# Patient Record
Sex: Female | Born: 1955 | Race: White | Hispanic: No
Health system: Southern US, Community
[De-identification: ages and names within clinical notes are randomized; demographics above are authoritative.]

## PROBLEM LIST (undated history)

## (undated) DIAGNOSIS — R197 Diarrhea, unspecified: Secondary | ICD-10-CM

## (undated) DIAGNOSIS — F419 Anxiety disorder, unspecified: Secondary | ICD-10-CM

## (undated) DIAGNOSIS — D649 Anemia, unspecified: Secondary | ICD-10-CM

## (undated) DIAGNOSIS — F1910 Other psychoactive substance abuse, uncomplicated: Secondary | ICD-10-CM

## (undated) DIAGNOSIS — F1110 Opioid abuse, uncomplicated: Secondary | ICD-10-CM

## (undated) DIAGNOSIS — K6389 Other specified diseases of intestine: Secondary | ICD-10-CM

## (undated) DIAGNOSIS — F909 Attention-deficit hyperactivity disorder, unspecified type: Secondary | ICD-10-CM

## (undated) DIAGNOSIS — F32A Depression, unspecified: Secondary | ICD-10-CM

## (undated) DIAGNOSIS — K219 Gastro-esophageal reflux disease without esophagitis: Secondary | ICD-10-CM

## (undated) DIAGNOSIS — K859 Acute pancreatitis without necrosis or infection, unspecified: Secondary | ICD-10-CM

## (undated) HISTORY — DX: Attention-deficit hyperactivity disorder, unspecified type: F90.9

## (undated) HISTORY — DX: Other specified diseases of intestine: K63.89

## (undated) HISTORY — DX: Diarrhea, unspecified: R19.7

## (undated) HISTORY — PX: TONSILLECTOMY: SUR1361

## (undated) HISTORY — DX: Gastro-esophageal reflux disease without esophagitis: K21.9

## (undated) HISTORY — DX: Other psychoactive substance abuse, uncomplicated: F19.10

## (undated) HISTORY — DX: Depression, unspecified: F32.A

---

## 2000-06-10 ENCOUNTER — Emergency Department (HOSPITAL_COMMUNITY): Admission: EM | Admit: 2000-06-10 | Discharge: 2000-06-10 | Payer: Self-pay | Admitting: Emergency Medicine

## 2000-11-01 ENCOUNTER — Emergency Department (HOSPITAL_COMMUNITY): Admission: EM | Admit: 2000-11-01 | Discharge: 2000-11-01 | Payer: Self-pay | Admitting: Emergency Medicine

## 2000-11-01 ENCOUNTER — Encounter: Payer: Self-pay | Admitting: Emergency Medicine

## 2002-09-19 ENCOUNTER — Other Ambulatory Visit: Admission: RE | Admit: 2002-09-19 | Discharge: 2002-09-19 | Payer: Self-pay | Admitting: Obstetrics & Gynecology

## 2007-11-16 ENCOUNTER — Emergency Department (HOSPITAL_BASED_OUTPATIENT_CLINIC_OR_DEPARTMENT_OTHER): Admission: EM | Admit: 2007-11-16 | Discharge: 2007-11-16 | Payer: Self-pay | Admitting: Emergency Medicine

## 2007-11-30 ENCOUNTER — Emergency Department (HOSPITAL_COMMUNITY): Admission: EM | Admit: 2007-11-30 | Discharge: 2007-11-30 | Payer: Self-pay | Admitting: Emergency Medicine

## 2010-07-01 ENCOUNTER — Emergency Department (HOSPITAL_COMMUNITY): Payer: Managed Care, Other (non HMO)

## 2010-07-01 ENCOUNTER — Emergency Department (HOSPITAL_COMMUNITY)
Admission: EM | Admit: 2010-07-01 | Discharge: 2010-07-01 | Disposition: A | Discharge status: Home / Self Care | Payer: Managed Care, Other (non HMO) | Attending: Emergency Medicine | Admitting: Emergency Medicine

## 2010-07-01 DIAGNOSIS — R112 Nausea with vomiting, unspecified: Secondary | ICD-10-CM | POA: Insufficient documentation

## 2010-07-01 DIAGNOSIS — F988 Other specified behavioral and emotional disorders with onset usually occurring in childhood and adolescence: Secondary | ICD-10-CM | POA: Insufficient documentation

## 2010-07-01 DIAGNOSIS — E860 Dehydration: Secondary | ICD-10-CM | POA: Insufficient documentation

## 2010-07-01 DIAGNOSIS — R197 Diarrhea, unspecified: Secondary | ICD-10-CM | POA: Insufficient documentation

## 2010-07-01 DIAGNOSIS — R109 Unspecified abdominal pain: Secondary | ICD-10-CM | POA: Insufficient documentation

## 2010-07-01 DIAGNOSIS — R509 Fever, unspecified: Secondary | ICD-10-CM | POA: Insufficient documentation

## 2010-07-01 DIAGNOSIS — F341 Dysthymic disorder: Secondary | ICD-10-CM | POA: Insufficient documentation

## 2010-07-01 LAB — URINALYSIS, ROUTINE W REFLEX MICROSCOPIC
Bilirubin Urine: NEGATIVE
Glucose, UA: NEGATIVE mg/dL
Ketones, ur: NEGATIVE mg/dL
Nitrite: NEGATIVE
Protein, ur: 100 mg/dL — AB
Specific Gravity, Urine: 1.028 (ref 1.005–1.030)
Urobilinogen, UA: 0.2 mg/dL (ref 0.0–1.0)
pH: 5.5 (ref 5.0–8.0)

## 2010-07-01 LAB — COMPREHENSIVE METABOLIC PANEL
ALT: 18 U/L (ref 0–35)
AST: 26 U/L (ref 0–37)
CO2: 24 mEq/L (ref 19–32)
Calcium: 8.2 mg/dL — ABNORMAL LOW (ref 8.4–10.5)
Chloride: 111 mEq/L (ref 96–112)
GFR calc Af Amer: >60 mL/min (ref >60)
GFR calc non Af Amer: >60 mL/min (ref >60)
Potassium: 3.2 mEq/L — ABNORMAL LOW (ref 3.5–5.1)
Sodium: 144 mEq/L (ref 135–145)

## 2010-07-01 LAB — URINE MICROSCOPIC-ADD ON

## 2010-07-01 LAB — CBC
Hemoglobin: 15 g/dL (ref 12.0–15.0)
MCHC: 33.9 g/dL (ref 30.0–36.0)
RBC: 4.71 MIL/uL (ref 3.87–5.11)
WBC: 9.5 10*3/uL (ref 4.0–10.5)

## 2010-07-01 LAB — DIFFERENTIAL
Basophils Absolute: 0 10*3/uL (ref 0.0–0.1)
Basophils Relative: 0 % (ref 0–1)
Neutro Abs: 7.9 10*3/uL — ABNORMAL HIGH (ref 1.7–7.7)
Neutrophils Relative %: 84 % — ABNORMAL HIGH (ref 43–77)

## 2010-07-01 LAB — OCCULT BLOOD, POC DEVICE: Fecal Occult Bld: NEGATIVE

## 2011-01-22 LAB — DIFFERENTIAL
Basophils Relative: 0
Eosinophils Absolute: 0.2
Eosinophils Relative: 4
Monocytes Absolute: 0.5
Monocytes Relative: 8

## 2011-01-22 LAB — BASIC METABOLIC PANEL
CO2: 27
Chloride: 107
Creatinine, Ser: 0.63
GFR calc Af Amer: >60
Sodium: 143

## 2011-01-22 LAB — CBC
Hemoglobin: 15.6 — ABNORMAL HIGH
MCHC: 35
MCV: 97.5
RBC: 4.56

## 2012-09-01 ENCOUNTER — Emergency Department (HOSPITAL_BASED_OUTPATIENT_CLINIC_OR_DEPARTMENT_OTHER): Payer: 59

## 2012-09-01 ENCOUNTER — Emergency Department (HOSPITAL_BASED_OUTPATIENT_CLINIC_OR_DEPARTMENT_OTHER)
Admission: EM | Admit: 2012-09-01 | Discharge: 2012-09-01 | Disposition: A | Discharge status: Home / Self Care | Payer: 59 | Attending: Emergency Medicine | Admitting: Emergency Medicine

## 2012-09-01 ENCOUNTER — Encounter (HOSPITAL_BASED_OUTPATIENT_CLINIC_OR_DEPARTMENT_OTHER): Payer: Self-pay | Admitting: *Deleted

## 2012-09-01 DIAGNOSIS — R52 Pain, unspecified: Secondary | ICD-10-CM | POA: Insufficient documentation

## 2012-09-01 DIAGNOSIS — Z79899 Other long term (current) drug therapy: Secondary | ICD-10-CM | POA: Insufficient documentation

## 2012-09-01 DIAGNOSIS — M25561 Pain in right knee: Secondary | ICD-10-CM

## 2012-09-01 DIAGNOSIS — Z880 Allergy status to penicillin: Secondary | ICD-10-CM | POA: Insufficient documentation

## 2012-09-01 DIAGNOSIS — F17200 Nicotine dependence, unspecified, uncomplicated: Secondary | ICD-10-CM | POA: Insufficient documentation

## 2012-09-01 DIAGNOSIS — F411 Generalized anxiety disorder: Secondary | ICD-10-CM | POA: Insufficient documentation

## 2012-09-01 DIAGNOSIS — Z8739 Personal history of other diseases of the musculoskeletal system and connective tissue: Secondary | ICD-10-CM | POA: Insufficient documentation

## 2012-09-01 DIAGNOSIS — M25569 Pain in unspecified knee: Secondary | ICD-10-CM | POA: Insufficient documentation

## 2012-09-01 DIAGNOSIS — M19029 Primary osteoarthritis, unspecified elbow: Secondary | ICD-10-CM | POA: Insufficient documentation

## 2012-09-01 HISTORY — DX: Anxiety disorder, unspecified: F41.9

## 2012-09-01 MED ORDER — IBUPROFEN 600 MG PO TABS: 600.0000 mg | ORAL_TABLET | Freq: Three times a day (TID) | ORAL | Status: DC | PRN

## 2012-09-01 MED ORDER — HYDROCODONE-ACETAMINOPHEN 5-325 MG PO TABS: 1.0000 | ORAL_TABLET | ORAL | Status: DC | PRN

## 2012-09-01 NOTE — ED Notes (Signed)
Patient states she injured her foot and knee approximately 3 months ago while walking.  States she was seen for her foot and was placed in a cam walker.  States her knee has had intermittent swelling and pain.  States last night the pain is in the posterior knee was more increased.  Pt has not received any treatment for her knee.

## 2012-09-01 NOTE — ED Notes (Signed)
MD at bedside. 

## 2012-09-01 NOTE — ED Provider Notes (Signed)
History     CSN: [id number]  Arrival date & time 09/01/12  4098   First MD Initiated Contact with Patient 09/01/12 1114      Chief Complaint  Patient presents with  . Knee Pain    HPI Is reports intermittent right knee pain over the past several months.  She injured her left foot and walked around a Cam Walker for some time.  She reported discomfort in her right knee which to get into a low seated car.  She has not seen anyone for this.  She reports last night the swelling in her right knee was worse.  No history of DVT.  No weakness or numbness of her lower extremity.  Her swelling is improved today.  She states she has a history of arthritis and bursitis in her left elbow.  She's never been told she has arthritis in her right knee.  She denies fevers and chills.  No redness.  Her pain is worsened when she is more active such as recently when she was hiking in the mountains.  Her pain and swelling improves with rest and minimizing her activity    Past Medical History  Diagnosis Date  . Anxiety     Past Surgical History  Procedure Laterality Date  . Tonsillectomy      No family history on file.  History  Substance Use Topics  . Smoking status: Current Every Day Smoker    Types: Cigarettes  . Smokeless tobacco: Not on file  . Alcohol Use: 0.0 oz/week    1-2 Glasses of wine per week     Comment: daily    OB History   Grav Para Term Preterm Abortions TAB SAB Ect Mult Living                  Review of Systems  All other systems reviewed and are negative.    Allergies  Penicillins  Home Medications   Current Outpatient Rx  Name  Route  Sig  Dispense  Refill  . amphetamine-dextroamphetamine (ADDERALL) 10 MG tablet   Oral   Take 20 mg by mouth 2 (two) times daily.         . clonazePAM (KLONOPIN) 0.5 MG tablet   Oral   Take 1 mg by mouth 2 (two) times daily as needed for anxiety.         Marland Kitchen HYDROcodone-acetaminophen (NORCO/VICODIN) 5-325 MG per tablet  Oral   Take 1 tablet by mouth every 4 (four) hours as needed for pain.   15 tablet   0   . ibuprofen (ADVIL,MOTRIN) 600 MG tablet   Oral   Take 1 tablet (600 mg total) by mouth every 8 (eight) hours as needed for pain.   15 tablet   0     BP 142/89  Temp(Src) 98.7 F (37.1 C) (Oral)  Ht 5\' 3"  (1.6 m)  Wt 160 lb (72.576 kg)  BMI 28.35 kg/m2  SpO2 99%  Physical Exam  Nursing note and vitals reviewed. Constitutional: She is oriented to person, place, and time. She appears well-developed and well-nourished. No distress.  HENT:  Head: Normocephalic and atraumatic.  Eyes: EOM are normal.  Neck: Normal range of motion.  Cardiovascular: Normal rate, regular rhythm and normal heart sounds.   Pulmonary/Chest: Effort normal and breath sounds normal.  Abdominal: Soft. She exhibits no distension. There is no tenderness.  Musculoskeletal: Normal range of motion.  No significant right knee joint effusion.  No erythema warmth of her right  knee.  Full range of motion of her right knee.  She does have some limitations at maximal flexion of the right knee.  Normal pulses in right foot.  No unilateral leg swelling.  Mild tenderness over her right medial joint line  Neurological: She is alert and oriented to person, place, and time.  Skin: Skin is warm and dry.  Psychiatric: She has a normal mood and affect. Judgment normal.    ED Course  Procedures (including critical care time)  Labs Reviewed - No data to display Dg Knee Complete 4 Views Right  09/01/2012  *RADIOLOGY REPORT*  Clinical Data: Knee pain  RIGHT KNEE - COMPLETE 4+ VIEW  Comparison: None.  Findings: Four views of the right knee submitted.  No acute fracture or subluxation.  No radiopaque foreign body.  No joint effusion.  IMPRESSION: No acute fracture or subluxation.   Original Report Authenticated By: Natasha Mead, M.D.    I personally reviewed the imaging tests through PACS system I reviewed available ER/hospitalization records  through the EMR   1. Right knee pain       MDM  Referral to sports medicine.  Patient will likely need an MRI as an outpatient.  Consider ligamentous versus meniscal injury.  No signs of septic arthritis today.  Doubt DVT.        Lyanne Co, MD 09/01/12 1140

## 2012-09-01 NOTE — ED Notes (Signed)
Patient transported to X-ray 

## 2019-03-20 ENCOUNTER — Other Ambulatory Visit: Payer: Self-pay

## 2019-03-20 ENCOUNTER — Emergency Department (HOSPITAL_COMMUNITY): Payer: Medicare Other

## 2019-03-20 ENCOUNTER — Emergency Department (HOSPITAL_COMMUNITY)
Admission: EM | Admit: 2019-03-20 | Discharge: 2019-03-20 | Disposition: A | Discharge status: Home / Self Care | Payer: Medicare Other | Attending: Emergency Medicine | Admitting: Emergency Medicine

## 2019-03-20 DIAGNOSIS — F1910 Other psychoactive substance abuse, uncomplicated: Secondary | ICD-10-CM | POA: Insufficient documentation

## 2019-03-20 DIAGNOSIS — F17210 Nicotine dependence, cigarettes, uncomplicated: Secondary | ICD-10-CM | POA: Insufficient documentation

## 2019-03-20 DIAGNOSIS — R4182 Altered mental status, unspecified: Secondary | ICD-10-CM | POA: Diagnosis present

## 2019-03-20 DIAGNOSIS — Z79899 Other long term (current) drug therapy: Secondary | ICD-10-CM | POA: Diagnosis not present

## 2019-03-20 HISTORY — DX: Opioid abuse, uncomplicated: F11.10

## 2019-03-20 HISTORY — DX: Other psychoactive substance abuse, uncomplicated: F19.10

## 2019-03-20 LAB — URINALYSIS, COMPLETE (UACMP) WITH MICROSCOPIC
Bilirubin Urine: NEGATIVE
Glucose, UA: NEGATIVE mg/dL
Ketones, ur: NEGATIVE mg/dL
Nitrite: NEGATIVE
Protein, ur: 30 mg/dL — AB
Specific Gravity, Urine: 1.013 (ref 1.005–1.030)
pH: 5 (ref 5.0–8.0)

## 2019-03-20 LAB — I-STAT CHEM 8, ED
BUN: 15 mg/dL (ref 8–23)
Calcium, Ion: 1.19 mmol/L (ref 1.15–1.40)
Chloride: 102 mmol/L (ref 98–111)
Creatinine, Ser: 1.5 mg/dL — ABNORMAL HIGH (ref 0.44–1.00)
Glucose, Bld: 174 mg/dL — ABNORMAL HIGH (ref 70–99)
HCT: 46 % (ref 36.0–46.0)
Hemoglobin: 15.6 g/dL — ABNORMAL HIGH (ref 12.0–15.0)
Potassium: 3.9 mmol/L (ref 3.5–5.1)
Sodium: 140 mmol/L (ref 135–145)
TCO2: 24 mmol/L (ref 22–32)

## 2019-03-20 LAB — CBC WITH DIFFERENTIAL/PLATELET
Abs Immature Granulocytes: 0.05 10*3/uL (ref 0.00–0.07)
Basophils Absolute: 0.1 10*3/uL (ref 0.0–0.1)
Basophils Relative: 1 %
Eosinophils Absolute: 0.3 10*3/uL (ref 0.0–0.5)
Eosinophils Relative: 4 %
HCT: 48.1 % — ABNORMAL HIGH (ref 36.0–46.0)
Hemoglobin: 15.4 g/dL — ABNORMAL HIGH (ref 12.0–15.0)
Immature Granulocytes: 1 %
Lymphocytes Relative: 26 %
Lymphs Abs: 2 10*3/uL (ref 0.7–4.0)
MCH: 32.2 pg (ref 26.0–34.0)
MCHC: 32 g/dL (ref 30.0–36.0)
MCV: 100.6 fL — ABNORMAL HIGH (ref 80.0–100.0)
Monocytes Absolute: 0.4 10*3/uL (ref 0.1–1.0)
Monocytes Relative: 5 %
Neutro Abs: 4.9 10*3/uL (ref 1.7–7.7)
Neutrophils Relative %: 63 %
Platelets: 257 10*3/uL (ref 150–400)
RBC: 4.78 MIL/uL (ref 3.87–5.11)
RDW: 12.1 % (ref 11.5–15.5)
WBC: 7.7 10*3/uL (ref 4.0–10.5)
nRBC: 0 % (ref 0.0–0.2)

## 2019-03-20 LAB — RAPID URINE DRUG SCREEN, HOSP PERFORMED
Amphetamines: NOT DETECTED
Barbiturates: NOT DETECTED
Benzodiazepines: POSITIVE — AB
Cocaine: POSITIVE — AB
Opiates: POSITIVE — AB
Tetrahydrocannabinol: NOT DETECTED

## 2019-03-20 LAB — COMPREHENSIVE METABOLIC PANEL
ALT: 27 U/L (ref 0–44)
AST: 39 U/L (ref 15–41)
Albumin: 4.5 g/dL (ref 3.5–5.0)
Alkaline Phosphatase: 75 U/L (ref 38–126)
Anion gap: 12 (ref 5–15)
BUN: 15 mg/dL (ref 8–23)
CO2: 23 mmol/L (ref 22–32)
Calcium: 8.8 mg/dL — ABNORMAL LOW (ref 8.9–10.3)
Chloride: 104 mmol/L (ref 98–111)
Creatinine, Ser: 1.7 mg/dL — ABNORMAL HIGH (ref 0.44–1.00)
GFR calc Af Amer: 36 mL/min — ABNORMAL LOW (ref >60)
GFR calc non Af Amer: 31 mL/min — ABNORMAL LOW (ref >60)
Glucose, Bld: 177 mg/dL — ABNORMAL HIGH (ref 70–99)
Potassium: 3.8 mmol/L (ref 3.5–5.1)
Sodium: 139 mmol/L (ref 135–145)
Total Bilirubin: 0.8 mg/dL (ref 0.3–1.2)
Total Protein: 8.3 g/dL — ABNORMAL HIGH (ref 6.5–8.1)

## 2019-03-20 LAB — BLOOD GAS, ARTERIAL
Acid-base deficit: 3.6 mmol/L — ABNORMAL HIGH (ref 0.0–2.0)
Bicarbonate: 22 mmol/L (ref 20.0–28.0)
O2 Saturation: 97.6 %
Patient temperature: 98.6
pCO2 arterial: 44 mmHg (ref 32.0–48.0)
pH, Arterial: 7.319 — ABNORMAL LOW (ref 7.350–7.450)
pO2, Arterial: 109 mmHg — ABNORMAL HIGH (ref 83.0–108.0)

## 2019-03-20 LAB — LACTIC ACID, PLASMA: Lactic Acid, Venous: 2.7 mmol/L (ref 0.5–1.9)

## 2019-03-20 LAB — ETHANOL: Alcohol, Ethyl (B): <10 mg/dL (ref <10)

## 2019-03-20 LAB — SALICYLATE LEVEL: Salicylate Lvl: <7 mg/dL (ref 2.8–30.0)

## 2019-03-20 LAB — ACETAMINOPHEN LEVEL: Acetaminophen (Tylenol), Serum: <10 ug/mL — ABNORMAL LOW (ref 10–30)

## 2019-03-20 MED ORDER — SODIUM CHLORIDE 0.9 % IV BOLUS
1000.0000 mL | Freq: Once | INTRAVENOUS | Status: AC
  Administered 2019-03-20: 1000 mL via INTRAVENOUS

## 2019-03-20 MED ORDER — ONDANSETRON HCL 4 MG/2ML IJ SOLN
4.0000 mg | Freq: Once | INTRAMUSCULAR | Status: AC
  Administered 2019-03-20: 14:00:00 4 mg via INTRAVENOUS
  Filled 2019-03-20: qty 2

## 2019-03-20 MED ORDER — SODIUM CHLORIDE 0.9 % IV BOLUS
1500.0000 mL | Freq: Once | INTRAVENOUS | Status: AC
  Administered 2019-03-20: 19:00:00 1500 mL via INTRAVENOUS

## 2019-03-20 MED ORDER — NALOXONE HCL 2 MG/2ML IJ SOSY
1.0000 mg | PREFILLED_SYRINGE | Freq: Once | INTRAMUSCULAR | Status: AC
  Administered 2019-03-20: 14:00:00 1 mg via INTRAVENOUS
  Filled 2019-03-20: qty 2

## 2019-03-20 MED ORDER — SODIUM CHLORIDE 0.9 % IV BOLUS: 1000.0000 mL | Freq: Once | INTRAVENOUS | Status: DC

## 2019-03-20 NOTE — ED Triage Notes (Signed)
Per EMS patient was found at her daughters home in the car, body was locked up per EMS. EMS states patient is alert and will answer questions but tends to gaze off and has to be redirected. VS stable per EMS. EMS says per daughter, patient has history of crack cocaine use but cannot confirm use today.

## 2019-03-20 NOTE — ED Provider Notes (Signed)
[facility] DEPT Provider Note   CSN: [id number] Arrival date & time: 03/20/19  1333     History   Chief Complaint Chief Complaint  Patient presents with  . Altered Mental Status    HPI Debra Jackson is a 63 y.o. female who presents to the emergency department via EMS for altered mental status.  There is a level 5 caveat due to altered mentation.  EMS reports that the patient has a history of crack cocaine abuse.  Patient's daughter called out because the patient was sitting in her car and was lethargic.  I was asked to come urgently to the recess bay because patient was found to be hypoxic to 80%.  Patient is somnolent but arousable to verbal and touch stimulus.  She seems to be alert and oriented to person place and time.  Patient given IV Narcan 1 mg with immediate improvement in her mental status.  She admits to snorting heroin earlier today.     HPI  Past Medical History:  Diagnosis Date  . Anxiety     There are no active problems to display for this patient.   Past Surgical History:  Procedure Laterality Date  . TONSILLECTOMY       OB History   No obstetric history on file.      Home Medications    Prior to Admission medications   Medication Sig Start Date End Date Taking? Authorizing Provider  amphetamine-dextroamphetamine (ADDERALL) 10 MG tablet Take 20 mg by mouth 2 (two) times daily.    [provider]  clonazePAM (KLONOPIN) 0.5 MG tablet Take 1 mg by mouth 2 (two) times daily as needed for anxiety.    [provider]  HYDROcodone-acetaminophen (NORCO/VICODIN) 5-325 MG per tablet Take 1 tablet by mouth every 4 (four) hours as needed for pain. 09/01/12   Jola Schmidt, MD  ibuprofen (ADVIL,MOTRIN) 600 MG tablet Take 1 tablet (600 mg total) by mouth every 8 (eight) hours as needed for pain. 09/01/12   Jola Schmidt, MD    Family History No family history on file.  Social History Social History   Tobacco  Use  . Smoking status: Current Every Day Smoker    Types: Cigarettes  Substance Use Topics  . Alcohol use: Yes    Alcohol/week: 1.0 - 2.0 standard drinks    Types: 1 - 2 Glasses of wine per week    Comment: daily  . Drug use: No     Allergies   Penicillins   Review of Systems Review of Systems Ten systems reviewed and are negative for acute change, except as noted in the HPI.    Physical Exam Updated Vital Signs BP (!) 165/100   Pulse 88   Temp 99.4 F (37.4 C) (Oral)   Resp 16   Ht 5\' 6"  (1.676 m)   Wt 81.6 kg   SpO2 94%   BMI 29.05 kg/m   Physical Exam Vitals signs and nursing note reviewed.  Constitutional:      General: She is not in acute distress.    Appearance: She is well-developed. She is not diaphoretic.  HENT:     Head: Normocephalic and atraumatic.  Eyes:     General: No scleral icterus.    Conjunctiva/sclera: Conjunctivae normal.     Comments: Miotic pupils  Neck:     Musculoskeletal: Normal range of motion.  Cardiovascular:     Rate and Rhythm: Normal rate and regular rhythm.     Heart sounds:  Normal heart sounds. No murmur. No friction rub. No gallop.   Pulmonary:     Effort: Pulmonary effort is normal. No respiratory distress.     Breath sounds: Normal breath sounds.  Abdominal:     General: Bowel sounds are normal. There is no distension.     Palpations: Abdomen is soft. There is no mass.     Tenderness: There is no abdominal tenderness. There is no guarding.  Skin:    General: Skin is warm and dry.  Neurological:     Mental Status: She is alert.     Comments: Patient somnolent, trailing off in between sentences consistent with opiate narcosis  Psychiatric:        Behavior: Behavior normal.      ED Treatments / Results  Labs (all labs ordered are listed, but only abnormal results are displayed) Labs Reviewed  COMPREHENSIVE METABOLIC PANEL - Abnormal; Notable for the following components:      Result Value   Glucose, Bld 177  (*)    Creatinine, Ser 1.70 (*)    Calcium 8.8 (*)    Total Protein 8.3 (*)    GFR calc non Af Amer 31 (*)    GFR calc Af Amer 36 (*)    All other components within normal limits  CBC WITH DIFFERENTIAL/PLATELET - Abnormal; Notable for the following components:   Hemoglobin 15.4 (*)    HCT 48.1 (*)    MCV 100.6 (*)    All other components within normal limits  BLOOD GAS, ARTERIAL - Abnormal; Notable for the following components:   pH, Arterial 7.319 (*)    pO2, Arterial 109 (*)    Acid-base deficit 3.6 (*)    All other components within normal limits  I-STAT CHEM 8, ED - Abnormal; Notable for the following components:   Creatinine, Ser 1.50 (*)    Glucose, Bld 174 (*)    Hemoglobin 15.6 (*)    All other components within normal limits  CULTURE, BLOOD (ROUTINE X 2)  CULTURE, BLOOD (ROUTINE X 2)  URINALYSIS, COMPLETE (UACMP) WITH MICROSCOPIC  AMMONIA  LACTIC ACID, PLASMA  RAPID URINE DRUG SCREEN, HOSP PERFORMED  ETHANOL  URINALYSIS, ROUTINE W REFLEX MICROSCOPIC  ACETAMINOPHEN LEVEL  SALICYLATE LEVEL  CBG MONITORING, ED    EKG EKG Interpretation  Date/Time:  Tuesday March 20 2019 14:07:01 EST Ventricular Rate:  98 PR Interval:    QRS Duration: 94 QT Interval:  367 QTC Calculation: 469 R Axis:   -31 Text Interpretation: Sinus rhythm Probable left atrial enlargement Left ventricular hypertrophy Anterior Q waves, possibly due to LVH Baseline wander in lead(s) II III aVF Confirmed by Veryl Speak [phone]) on 03/20/2019 2:16:48 PM   Radiology No results found.  Procedures Procedures (including critical care time)  Medications Ordered in ED Medications  naloxone [facility]) injection 1 mg (1 mg Intravenous Given 03/20/19 1402)  ondansetron (ZOFRAN) injection 4 mg (4 mg Intravenous Given 03/20/19 1403)  sodium chloride 0.9 % bolus 1,000 mL (1,000 mLs Intravenous New Bag/Given 03/20/19 1403)     Initial Impression / Assessment and Plan / ED Course  I have reviewed  the triage vital signs and the nursing notes.  Pertinent labs & imaging results that were available during my care of the patient were reviewed by me and considered in my medical decision making (see chart for details).  Clinical Course as of Mar 19 2113  Tue Mar 20, 2019  1755 Lactic Acid, Venous(!!): 2.7 [AH]  1755 pH, Arterial(!): 7.319 [  AH]  1755 WBC: 7.7 [AH]    Clinical Course User Index [AH] Margarita Mail, PA-C       CC:ams/ hypoxia VS:  Vitals:   03/20/19 1900 03/20/19 2040 03/20/19 2041 03/20/19 2048  BP: (!) 121/93 (!) 138/93 (!) 138/93 (!) 148/81  Pulse: 86 65 82 80  Resp: 15  17 18   Temp:   98.7 F (37.1 C)   TempSrc:   Oral   SpO2: (!) 89% 97% 94% 98%  Weight:      Height:       [id number] is gathered by patient and emr. DDX:The differential diagnosis for AMS is extensive and includes, but is not limited to: drug overdose - opioids, alcohol, sedatives, antipsychotics, drug withdrawal, others; Metabolic: hypoxia, hypoglycemia, hyperglycemia, hypercalcemia, hypernatremia, hyponatremia, uremia, hepatic encephalopathy, hypothyroidism, hyperthyroidism, vitamin B12 or thiamine deficiency, carbon monoxide poisoning, Wilson's disease, Lactic acidosis, DKA/HHOS; Infectious: meningitis, encephalitis, bacteremia/sepsis, urinary tract infection, pneumonia, neurosyphilis; Structural: Space-occupying lesion, (brain tumor, subdural hematoma, hydrocephalus,); Vascular: stroke, subarachnoid hemorrhage, coronary ischemia, hypertensive encephalopathy, CNS vasculitis, thrombotic thrombocytopenic purpura, disseminated intravascular coagulation, hyperviscosity; Psychiatric: Schizophrenia, depression; Other: Seizure, hypothermia, heat stroke, ICU psychosis, dementia -"sundowning." Labs: I reviewed the labs which show urine drug screen positive for opiates, cocaine's and benzos.  Urinalysis appears contaminated.  Patient's creatinine appears to be at baseline.  She has elevated blood glucose  of insignificant value.  CBC shows no elevated white blood cell count.  Lactic acid elevated with ABG showing acidosis likely secondary to some dehydration or cocaine abuse. Imaging: I personally reviewed the images (portable 1 view chest x-ray and head CT) which show(s) no acute abnormalities on my interpretation EKG: Normal sinus rhythm at a rate of 98 MDM: Patient here with hypoxia and altered mental status secondary to unintentional drug overdose.  Her urine is positive for cocaine, benzos.  She admits to snorting heroin prior to arrival.  Patient became immediately more alert after dose of Narcan.  She was observed in the emergency department for several hours with improvement in her mentation, she has no more opiate or benzo narcosis and is maintaining her airway throughout without oxygen support and wishes to be discharged at this time. Patient disposition: Discharge Patient condition: Good. The patient appears reasonably screened and/or stabilized for discharge and I doubt any other medical condition or other [facility] requiring further screening, evaluation, or treatment in the ED at this time prior to discharge. I have discussed lab and/or imaging findings with the patient and answered all questions/concerns to the best of my ability. I have discussed return precautions and OP follow up.      Final Clinical Impressions(s) / ED Diagnoses   Final diagnoses:  None    ED Discharge Orders    None       Margarita Mail, PA-C 03/20/19 2119    Veryl Speak, MD 03/21/19 [phone]

## 2019-03-20 NOTE — ED Notes (Signed)
Pt drinking gingerale. States would like to try to use restroom when finished

## 2019-03-20 NOTE — ED Provider Notes (Signed)
Patient wants to go home.  Her lab results are back and she has positive opiates, cocaine, benzos. I turned her oxygen off and her O2 sats were 9394%.  She denies any shortness of breath.  She has mild AKI but was given IV fluids.  She is aware that her creatinine has gone up and that she needs to hydrate well.  Peer support consult has been placed.  She has no SI but thinks that she might need to see a psychiatrist.   Strict ER return precautions have been discussed, and patient is agreeing with the plan and is comfortable with the workup done and the recommendations from the ER.     Varney Biles, MD 03/20/19 2021

## 2019-03-20 NOTE — Discharge Instructions (Addendum)
Substance Abuse Treatment Programs ° °Intensive Outpatient Programs °[facility]     °[address]      °[place], [place]                   °[phone]      ° °[facility] °[address] °[place], [place] °[phone] ° °[facility]     °(Inpatient and outpatient)     °[address]           °[phone]   ° °[facility] °[phone] (Suboxone and Methadone) ° °[address]      °[place], [place] [address]      °[phone]      ° °[address] °[place], [place] °852-3033 ° °[facility] (Outpatient/Inpatient, Chemical)    °(insurance only) [phone]      °       °[facility] (Groups & Residential) °[place], [place] °[phone] ° °   °[facility]     °[address]     °[place], [place]      °[phone]      ° °[facility] (for caregivers and family) °[address] °[place], [place] °336-299-4655 ° ° ° ° ° °Residential Treatment Programs °[facility]      °[address], [place], [place] [address]  °[phone]      ° °[facility] °[address] [place], [place] [address] °[phone] ° °[facility]        °[phone]      ° °[facility] °[phone] ° °[facility]             °[address]                                         °[place], [place]                                                °877-615-2722 or [phone]                              ° °[facility] [place] °[address] °[place], [address] °[phone] ° °[facility]    °[address]      °[place], [place]     °[phone]      ° °[facility] °[address] °[place], [place] °[phone] ° °[facility]   °[address]     °[place], [place] [address]     °[phone]      °Admissions: 8am-3pm M-F ° °[facility] °[address] °[place],  [place] °[phone] ° °[facility]: Residential Program (90 Days)   °[place], [place]      °336-725-8389 or [phone]    ° °[facility] °[place],  °(Walk in Hours over the weekend or by referral) ° °[facility] °[address], [place],  [address] °([phone] ° °Crisis Mobile: [facility]:  1-877-626-1772 (for crisis response 24 hours a day) °[facility] Hotline:      1-800-256-2452 °Outpatient Psychiatry and Counseling ° °[facility]: Mobile Crisis   Management 24 hours:  1-877-626-1772 ° °[facility] sliding scale fee and walk in schedule: M-F 8am-12pm/1pm-3pm °[address]  °[place], [place] [address] °[phone] ° °[facility] °[address] °[place], [place] [address] °[phone] ° °[facility] ([facility])- new patient walk-in appointments available Monday - Friday 8am -3pm.          °[address] °[place], [place] [address] °336-676-6840 or crisis line- 336-676-6905 ° °[facility] Intensive Outpatient Therapy Program °[address] °[place], [place] [address] °336-832-9804 ° °[facility]                  °Crisis Services      °[phone]      °[address]     °[place], [place] [address]                ° °[facility]   °[facility] °[phone] °[address] °[place], [place] [address] ° ° °[facility]          °[address]  °[place], [place] [address]       °[phone] ° °[facility] °[address] °[place], [place] [address] °336-292-1510 ° °[facility]    °[address]    °[place], [place] [address]     °[phone]      ° °Parish McKinney, MD     °[address]     °[place] [address]     °336-282-1251     °  °[facility] °[address] °[place] [address] ° °[facility]     °[address]     °[place], [place]      °336-542-2076      ° °[facility] °Shamsher Ahluwalia, MD °[address] °[place], [place] [address] °[phone] ° °[facility]     °[address]     °[place], [place] [address]     °336-274-1237      ° °[facility] °[address] °[place], [place] [address] °336-854-4450 °Resources for Temporary Residential Assistance/Crisis Centers ° °DAY CENTERS °[facility] °M-F 8am-3pm   °[address] [place], [place] [address]   336-332-0824 °Services include: laundry, barbering, support groups, case management, phone  & computer access, showers, AA/NA mtgs, mental health/substance abuse nurse, job skills class, disability information, VA assistance, spiritual classes, etc.  ° °HOMELESS SHELTERS ° °[facility]     °[facility]   °[address], [place]     °[phone]       °       °[facility] (women and children)       °[address] °[place], [place] [address] °336-275-0820 °[email or web address] for application and process °Application Required ° °[facility]   °[address]    °[place] [address]     °[phone]       °             °[facility] °[address] °, [place] [address] °336.273.5572 °336-235-0363(schedule application appt.) °Application Required ° °[facility] (women only)    °[address]     °[place],  [address]     °336-884-1039      °  Intake starts 6pm daily °Need valid ID, SSC, & Police report °[facility] [place] °[address] °[place], [place] °336-881-5420 °Application Required ° °[facility] (men only)     °[address]      °[place], [place]     °[phone]      ° °[facility] °(Pregnant women only) °[address] °[place], [place] °336-275-0206 ° °[facility]      °[address]      °[place], [place] [address]     °[phone]      °       °[facility] °[address] °[place], [place] °336-723-1848 °90 day commitment/SA/Application process ° °[facility](men only)     °[address]     °[place], [place]     °[phone]       °Check-in at 7pm     °       °[facility] °[address] °[place], [place] [address] °336-248-6684 °Men/Women/Women and Children must be there by 7 pm ° °[facility] °[place], [place] °336-722-8721                ° °

## 2019-03-20 NOTE — ED Notes (Addendum)
Date and time results received: 03/20/19 3:52 PM  Test: Lactic Acid Critical Value: 2.7  Vernie Shanks, PA and [place], MD made aware.

## 2019-03-21 ENCOUNTER — Encounter (HOSPITAL_COMMUNITY): Payer: Self-pay | Admitting: Emergency Medicine

## 2019-03-21 ENCOUNTER — Emergency Department (HOSPITAL_COMMUNITY)
Admission: EM | Admit: 2019-03-21 | Discharge: 2019-03-21 | Disposition: A | Discharge status: Home / Self Care | Payer: Medicare Other | Attending: Emergency Medicine | Admitting: Emergency Medicine

## 2019-03-21 ENCOUNTER — Encounter (HOSPITAL_COMMUNITY): Payer: Self-pay

## 2019-03-21 ENCOUNTER — Telehealth (HOSPITAL_BASED_OUTPATIENT_CLINIC_OR_DEPARTMENT_OTHER): Payer: Self-pay | Admitting: Emergency Medicine

## 2019-03-21 ENCOUNTER — Telehealth (HOSPITAL_BASED_OUTPATIENT_CLINIC_OR_DEPARTMENT_OTHER): Payer: Self-pay | Admitting: *Deleted

## 2019-03-21 DIAGNOSIS — R7989 Other specified abnormal findings of blood chemistry: Secondary | ICD-10-CM | POA: Insufficient documentation

## 2019-03-21 DIAGNOSIS — Z79899 Other long term (current) drug therapy: Secondary | ICD-10-CM | POA: Insufficient documentation

## 2019-03-21 DIAGNOSIS — R799 Abnormal finding of blood chemistry, unspecified: Secondary | ICD-10-CM | POA: Diagnosis present

## 2019-03-21 DIAGNOSIS — F17210 Nicotine dependence, cigarettes, uncomplicated: Secondary | ICD-10-CM | POA: Diagnosis not present

## 2019-03-21 LAB — BASIC METABOLIC PANEL WITH GFR
Anion gap: 10 (ref 5–15)
BUN: 11 mg/dL (ref 8–23)
CO2: 25 mmol/L (ref 22–32)
Calcium: 9.1 mg/dL (ref 8.9–10.3)
Chloride: 101 mmol/L (ref 98–111)
Creatinine, Ser: 0.94 mg/dL (ref 0.44–1.00)
GFR calc Af Amer: >60 mL/min
GFR calc non Af Amer: >60 mL/min
Glucose, Bld: 94 mg/dL (ref 70–99)
Potassium: 3.6 mmol/L (ref 3.5–5.1)
Sodium: 136 mmol/L (ref 135–145)

## 2019-03-21 LAB — CBC WITH DIFFERENTIAL/PLATELET
Abs Immature Granulocytes: 0.03 10*3/uL (ref 0.00–0.07)
Basophils Absolute: 0 10*3/uL (ref 0.0–0.1)
Basophils Relative: 0 %
Eosinophils Absolute: 0.3 10*3/uL (ref 0.0–0.5)
Eosinophils Relative: 4 %
HCT: 43.9 % (ref 36.0–46.0)
Hemoglobin: 14.2 g/dL (ref 12.0–15.0)
Immature Granulocytes: 0 %
Lymphocytes Relative: 25 %
Lymphs Abs: 1.9 10*3/uL (ref 0.7–4.0)
MCH: 32 pg (ref 26.0–34.0)
MCHC: 32.3 g/dL (ref 30.0–36.0)
MCV: 98.9 fL (ref 80.0–100.0)
Monocytes Absolute: 0.5 10*3/uL (ref 0.1–1.0)
Monocytes Relative: 7 %
Neutro Abs: 4.8 10*3/uL (ref 1.7–7.7)
Neutrophils Relative %: 64 %
Platelets: 201 10*3/uL (ref 150–400)
RBC: 4.44 MIL/uL (ref 3.87–5.11)
RDW: 12 % (ref 11.5–15.5)
WBC: 7.5 10*3/uL (ref 4.0–10.5)
nRBC: 0 % (ref 0.0–0.2)

## 2019-03-21 NOTE — ED Triage Notes (Signed)
Pt returns today after receiving call for positive blood cultures. Pt has wound on right forearm as well.

## 2019-03-21 NOTE — ED Provider Notes (Signed)
[facility] DEPT Provider Note   CSN: [id number] Arrival date & time: 03/21/19  1208     History   Chief Complaint Chief Complaint  Patient presents with  . Abnormal Lab    HPI Debra Jackson is a 63 y.o. female.  Presents to ER with concern for abnormal blood culture result.  Patient was seen in our emergency department yesterday after concern for accidental overdose.  No UDS demonstrated opiates, cocaine, benzos.  Patient was observed in ER, she was able to be discharged home.  Blood cultures drawn yesterday, today grew gram-positive rods and gram-positive cocci from 1 bottle only.  Patient was recommended to come to ER for further evaluation.  Patient states today she has absolutely no symptoms, feels normal.  She has no rashes, no abscess, no fevers.     HPI  Past Medical History:  Diagnosis Date  . Anxiety   . Opiate abuse, continuous ([place])   . Polysubstance abuse ([place])     There are no active problems to display for this patient.   Past Surgical History:  Procedure Laterality Date  . TONSILLECTOMY       OB History   No obstetric history on file.      Home Medications    Prior to Admission medications   Medication Sig Start Date End Date Taking? Authorizing Provider  amphetamine-dextroamphetamine (ADDERALL) 10 MG tablet Take 20 mg by mouth 2 (two) times daily.    [provider]  clonazePAM (KLONOPIN) 0.5 MG tablet Take 1 mg by mouth 2 (two) times daily as needed for anxiety.    [provider]  HYDROcodone-acetaminophen (NORCO/VICODIN) 5-325 MG per tablet Take 1 tablet by mouth every 4 (four) hours as needed for pain. 09/01/12   Jola Schmidt, MD  ibuprofen (ADVIL,MOTRIN) 600 MG tablet Take 1 tablet (600 mg total) by mouth every 8 (eight) hours as needed for pain. 09/01/12   Jola Schmidt, MD    Family History History reviewed. No pertinent family history.  Social History Social History   Tobacco Use  .  Smoking status: Current Every Day Smoker    Types: Cigarettes  Substance Use Topics  . Alcohol use: Yes    Alcohol/week: 1.0 - 2.0 standard drinks    Types: 1 - 2 Glasses of wine per week    Comment: daily  . Drug use: Yes    Types: Cocaine    Comment: heroin     Allergies   Penicillins   Review of Systems Review of Systems  Constitutional: Negative for chills and fever.  HENT: Negative for ear pain and sore throat.   Eyes: Negative for pain and visual disturbance.  Respiratory: Negative for cough and shortness of breath.   Cardiovascular: Negative for chest pain and palpitations.  Gastrointestinal: Negative for abdominal pain and vomiting.  Genitourinary: Negative for dysuria and hematuria.  Musculoskeletal: Negative for arthralgias and back pain.  Skin: Negative for color change and rash.  Neurological: Negative for seizures and syncope.  All other systems reviewed and are negative.    Physical Exam Updated Vital Signs BP (!) 153/76 (BP Location: Right Arm)   Pulse 86   Temp 98 F (36.7 C) (Oral)   Resp 17   SpO2 97%   Physical Exam Vitals signs and nursing note reviewed.  Constitutional:      General: She is not in acute distress.    Appearance: She is well-developed.  HENT:     Head: Normocephalic and atraumatic.  Eyes:     Conjunctiva/sclera: Conjunctivae normal.  Neck:     Musculoskeletal: Neck supple.  Cardiovascular:     Rate and Rhythm: Normal rate and regular rhythm.     Heart sounds: No murmur.  Pulmonary:     Effort: Pulmonary effort is normal. No respiratory distress.     Breath sounds: Normal breath sounds.  Abdominal:     Palpations: Abdomen is soft.     Tenderness: There is no abdominal tenderness.  Musculoskeletal:        General: No swelling or tenderness.  Skin:    General: Skin is warm and dry.     Capillary Refill: Capillary refill takes less than 2 seconds.  Neurological:     General: No focal deficit present.     Mental  Status: She is alert and oriented to person, place, and time.      ED Treatments / Results  Labs (all labs ordered are listed, but only abnormal results are displayed) Labs Reviewed  CBC WITH DIFFERENTIAL/PLATELET  BASIC METABOLIC PANEL    EKG None  Radiology Ct Head Wo Contrast  Result Date: 03/20/2019 CLINICAL DATA:  Altered mental status. EXAM: CT HEAD WITHOUT CONTRAST TECHNIQUE: Contiguous axial images were obtained from the base of the skull through the vertex without intravenous contrast. COMPARISON:  None. FINDINGS: Brain: No evidence of acute infarction, hemorrhage, hydrocephalus, extra-axial collection or mass lesion/mass effect. Mild generalized cerebral atrophy. Vascular: No hyperdense vessel or unexpected calcification. Skull: Normal. Negative for fracture or focal lesion. Sinuses/Orbits: No acute finding. Other: None. IMPRESSION: 1.  No acute intracranial abnormality. Electronically Signed   By: Titus Dubin M.D.   On: 03/20/2019 15:13   Dg Chest Port 1 View  Result Date: 03/20/2019 CLINICAL DATA:  Hypoxia EXAM: PORTABLE CHEST 1 VIEW COMPARISON:  November 16, 2007 FINDINGS: Lungs are clear. Heart size and pulmonary vascularity are normal. No adenopathy. No bone lesions. IMPRESSION: No edema or consolidation. Electronically Signed   By: Lowella Grip III M.D.   On: 03/20/2019 19:12    Procedures Procedures (including critical care time)  Medications Ordered in ED Medications - No data to display   Initial Impression / Assessment and Plan / ED Course  I have reviewed the triage vital signs and the nursing notes.  Pertinent labs & imaging results that were available during my care of the patient were reviewed by me and considered in my medical decision making (see chart for details).  Clinical Course as of Mar 20 1413  Wed Mar 21, 2019  1300 Complete initial assessment, patient very well-appearing in no distress, no obvious signs of infection   [RD]  1336  Discussed with Dustin with ID pharmacy, reviewed culture results in detail, most likely contaminant, he will follow up on culture results   [RD]    Clinical Course User Index [RD] Debra Starch, MD       63 year old lady presented to ER with concern for abnormal blood cultures.  Yesterday she was seen for accidental overdose requiring Narcan.  Discharged after period of observation.  Blood cultures were for some reason drawn yesterday, though I am unable to discern from provider note the indication for blood cultures yesterday.  She had no infectious symptoms, no fever yesterday.  Today she is very well-appearing, has no complaints, no fever, normal vital signs.  I repeated her blood work, she has no leukocytosis, her mild AKI from yesterday has completely resolved.  I reviewed the results in detail with our  ID pharmacist, likely the positive culture results is a contaminant.  Given above work-up, patient's lack of symptoms, will discharge patient home, Dustin with ID pharmacy will follow up on blood culture results should something change or become more concerning after further growth or speciation.  Reviewed return precautions with patient.    After the discussed management above, the patient was determined to be safe for discharge.  The patient was in agreement with this plan and all questions regarding their care were answered.  ED return precautions were discussed and the patient will return to the ED with any significant worsening of condition.    Final Clinical Impressions(s) / ED Diagnoses   Final diagnoses:  Contamination of blood culture    ED Discharge Orders    None       Debra Starch, MD 03/22/19 2046

## 2019-03-21 NOTE — Telephone Encounter (Signed)
Received call from lab with positive blood cultures. Consulted with Dr. Sedonia Small, who advised pt return to ED. Will have days shift make contact with patient.

## 2019-03-21 NOTE — Discharge Instructions (Signed)
If you develop any fever, cough, difficulty breathing, new rash, abdominal pain or other new concerning symptom, please return to ER for reassessment.  If the blood culture results worsen and there is a concerning finding, you will be called with additional instructions.  At this time however, we believe that is most likely a contaminant.

## 2019-03-22 LAB — CULTURE, BLOOD (ROUTINE X 2): Special Requests: ADEQUATE

## 2019-03-23 ENCOUNTER — Telehealth: Payer: Self-pay

## 2019-03-23 NOTE — Telephone Encounter (Signed)
Post ED Visit - Positive Culture Follow-up  Culture report reviewed by antimicrobial stewardship pharmacist: [place] Team []  Elenor Quinones, Pharm.D. []  Heide Guile, Pharm.D., BCPS AQ-ID []  Parks Neptune, Pharm.D., BCPS []  Alycia Rossetti, Pharm.D., BCPS []  [place], Pharm.D., BCPS, AAHIVP []  Legrand Como, Pharm.D., BCPS, AAHIVP []  Salome Arnt, PharmD, BCPS []  Johnnette Gourd, PharmD, BCPS []  Hughes Better, PharmD, BCPS []  Leeroy Cha, PharmD []  Laqueta Linden, PharmD, BCPS []  Albertina Parr, PharmD  [facility] Team []  Leodis Sias, PharmD []  Lindell Spar, PharmD [x]  Royetta Asal, PharmD []  Graylin Shiver, Rph []  Rema Fendt) Glennon Mac, PharmD []  Arlyn Dunning, PharmD []  Netta Cedars, PharmD []  Dia Sitter, PharmD []  Leone Haven, PharmD []  Gretta Arab, PharmD []  Theodis Shove, PharmD []  Peggyann Juba, PharmD []  Reuel Boom, PharmD   Positive blood cultue treated on 11/25 and no further patient follow-up is required at this time.  Genia Del 03/23/2019, 10:07 AM

## 2019-06-28 ENCOUNTER — Ambulatory Visit: Payer: Medicare Other | Attending: Internal Medicine

## 2019-06-28 DIAGNOSIS — Z23 Encounter for immunization: Secondary | ICD-10-CM | POA: Insufficient documentation

## 2019-06-28 NOTE — Progress Notes (Signed)
   Covid-19 Vaccination Clinic  Name:  Debra Jackson    MRN: [id number] DOB: [birth year]  06/28/2019  [name] was observed post Covid-19 immunization for 15 minutes without incident. She was provided with Vaccine Information Sheet and instruction to access the V-Safe system.   [name] was instructed to call 911 with any severe reactions post vaccine: Marland Kitchen Difficulty breathing  . Swelling of face and throat  . A fast heartbeat  . A bad rash all over body  . Dizziness and weakness

## 2019-07-24 ENCOUNTER — Ambulatory Visit: Payer: Medicare Other | Attending: Internal Medicine

## 2019-07-24 DIAGNOSIS — Z23 Encounter for immunization: Secondary | ICD-10-CM

## 2019-07-24 NOTE — Progress Notes (Signed)
   Covid-19 Vaccination Clinic  Name:  Debra Jackson    MRN: [id number] DOB: 1956/04/04  07/24/2019  [name] was observed post Covid-19 immunization for 15 minutes without incident. She was provided with Vaccine Information Sheet and instruction to access the V-Safe system.   [name] was instructed to call 911 with any severe reactions post vaccine: Marland Kitchen Difficulty breathing  . Swelling of face and throat  . A fast heartbeat  . A bad rash all over body  . Dizziness and weakness   Immunizations Administered    Name Date Dose VIS Date Route   Pfizer COVID-19 Vaccine 07/24/2019  2:04 PM 0.3 mL 04/06/2019 Intramuscular   Manufacturer: [place]   Lot: [id number]   [place]: [id number]

## 2020-01-24 ENCOUNTER — Ambulatory Visit (HOSPITAL_COMMUNITY)
Admission: RE | Admit: 2020-01-24 | Discharge: 2020-01-24 | Disposition: A | Discharge status: Home / Self Care | Payer: Medicare Other | Attending: Psychiatry | Admitting: Psychiatry

## 2020-01-24 ENCOUNTER — Telehealth (HOSPITAL_COMMUNITY): Payer: Self-pay | Admitting: Psychiatry

## 2020-01-24 DIAGNOSIS — F1910 Other psychoactive substance abuse, uncomplicated: Secondary | ICD-10-CM | POA: Diagnosis not present

## 2020-01-24 DIAGNOSIS — F988 Other specified behavioral and emotional disorders with onset usually occurring in childhood and adolescence: Secondary | ICD-10-CM | POA: Insufficient documentation

## 2020-01-24 DIAGNOSIS — F329 Major depressive disorder, single episode, unspecified: Secondary | ICD-10-CM | POA: Insufficient documentation

## 2020-01-24 DIAGNOSIS — F419 Anxiety disorder, unspecified: Secondary | ICD-10-CM | POA: Diagnosis not present

## 2020-01-24 NOTE — Telephone Encounter (Signed)
D:  Patient was referred per Waldon Merl (TTS) for PHP.  A:  Placed call to orient patient but there was no answer and her vm hadn't been set up.  Attempted to reach her by cell and the call wouldn't go through.  Will inform [place] [employer], LCSW.
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
## 2020-01-28 ENCOUNTER — Encounter (HOSPITAL_BASED_OUTPATIENT_CLINIC_OR_DEPARTMENT_OTHER): Payer: Self-pay | Admitting: Emergency Medicine

## 2020-01-28 ENCOUNTER — Emergency Department (HOSPITAL_BASED_OUTPATIENT_CLINIC_OR_DEPARTMENT_OTHER)
Admission: EM | Admit: 2020-01-28 | Discharge: 2020-01-28 | Disposition: A | Discharge status: Home / Self Care | Payer: Medicare Other | Attending: Emergency Medicine | Admitting: Emergency Medicine

## 2020-01-28 ENCOUNTER — Other Ambulatory Visit: Payer: Self-pay

## 2020-01-28 DIAGNOSIS — R109 Unspecified abdominal pain: Secondary | ICD-10-CM | POA: Insufficient documentation

## 2020-01-28 DIAGNOSIS — F419 Anxiety disorder, unspecified: Secondary | ICD-10-CM | POA: Diagnosis not present

## 2020-01-28 DIAGNOSIS — F17210 Nicotine dependence, cigarettes, uncomplicated: Secondary | ICD-10-CM | POA: Insufficient documentation

## 2020-01-28 DIAGNOSIS — R1110 Vomiting, unspecified: Secondary | ICD-10-CM | POA: Diagnosis present

## 2020-01-28 LAB — COMPREHENSIVE METABOLIC PANEL
ALT: 29 U/L (ref 0–44)
AST: 45 U/L — ABNORMAL HIGH (ref 15–41)
Albumin: 4.6 g/dL (ref 3.5–5.0)
Alkaline Phosphatase: 61 U/L (ref 38–126)
Anion gap: 15 (ref 5–15)
BUN: 10 mg/dL (ref 8–23)
CO2: 26 mmol/L (ref 22–32)
Calcium: 9.9 mg/dL (ref 8.9–10.3)
Chloride: 98 mmol/L (ref 98–111)
Creatinine, Ser: 0.86 mg/dL (ref 0.44–1.00)
GFR calc Af Amer: >60 mL/min (ref >60)
GFR calc non Af Amer: >60 mL/min (ref >60)
Glucose, Bld: 168 mg/dL — ABNORMAL HIGH (ref 70–99)
Potassium: 3.1 mmol/L — ABNORMAL LOW (ref 3.5–5.1)
Sodium: 139 mmol/L (ref 135–145)
Total Bilirubin: 0.7 mg/dL (ref 0.3–1.2)
Total Protein: 7.8 g/dL (ref 6.5–8.1)

## 2020-01-28 LAB — LIPASE, BLOOD: Lipase: 39 U/L (ref 11–51)

## 2020-01-28 LAB — CBC WITH DIFFERENTIAL/PLATELET
Abs Immature Granulocytes: 0.06 10*3/uL (ref 0.00–0.07)
Basophils Absolute: 0.1 10*3/uL (ref 0.0–0.1)
Basophils Relative: 1 %
Eosinophils Absolute: 0 10*3/uL (ref 0.0–0.5)
Eosinophils Relative: 0 %
HCT: 41.6 % (ref 36.0–46.0)
Hemoglobin: 14.9 g/dL (ref 12.0–15.0)
Immature Granulocytes: 1 %
Lymphocytes Relative: 10 %
Lymphs Abs: 0.8 10*3/uL (ref 0.7–4.0)
MCH: 32.4 pg (ref 26.0–34.0)
MCHC: 35.8 g/dL (ref 30.0–36.0)
MCV: 90.4 fL (ref 80.0–100.0)
Monocytes Absolute: 0.7 10*3/uL (ref 0.1–1.0)
Monocytes Relative: 8 %
Neutro Abs: 6.5 10*3/uL (ref 1.7–7.7)
Neutrophils Relative %: 80 %
Platelets: 175 10*3/uL (ref 150–400)
RBC: 4.6 MIL/uL (ref 3.87–5.11)
RDW: 13.3 % (ref 11.5–15.5)
WBC: 8.1 10*3/uL (ref 4.0–10.5)
nRBC: 0 % (ref 0.0–0.2)

## 2020-01-28 MED ORDER — SODIUM CHLORIDE 0.9 % IV BOLUS
500.0000 mL | Freq: Once | INTRAVENOUS | Status: AC
  Administered 2020-01-28: 500 mL via INTRAVENOUS

## 2020-01-28 MED ORDER — LORAZEPAM 2 MG/ML IJ SOLN
1.0000 mg | Freq: Once | INTRAMUSCULAR | Status: AC
  Administered 2020-01-28: 1 mg via INTRAVENOUS
  Filled 2020-01-28: qty 1

## 2020-01-28 NOTE — ED Notes (Signed)
Asked pt for urine sample, states does not need to urinate at this time, requested water. Provided pt with water per request in efforts to obtain sample. Visitor at bedside, no acute distress noted at this time.

## 2020-01-28 NOTE — ED Triage Notes (Signed)
Pt here with severe anxiety attack and emesis x 2 days.
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
## 2020-01-28 NOTE — ED Notes (Signed)
Pt aware of need for urine specimen, unable to  Provide at this time, IV fluids infusing.

## 2020-01-28 NOTE — ED Notes (Signed)
ED Provider at bedside. 

## 2020-04-11 ENCOUNTER — Ambulatory Visit: Payer: Medicare Other | Attending: Internal Medicine

## 2020-04-11 DIAGNOSIS — Z23 Encounter for immunization: Secondary | ICD-10-CM

## 2020-04-11 NOTE — Progress Notes (Signed)
   Covid-19 Vaccination Clinic  Name:  Debra Jackson    MRN: [id number] DOB: 07-13-1955  04/11/2020  [name] was observed post Covid-19 immunization for 15 minutes without incident. She was provided with Vaccine Information Sheet and instruction to access the V-Safe system.   Ms. Pain was instructed to call 911 with any severe reactions post vaccine: Marland Kitchen Difficulty breathing  . Swelling of face and throat  . A fast heartbeat  . A bad rash all over body  . Dizziness and weakness   Immunizations Administered    Name Date Dose VIS Date Route   Pfizer COVID-19 Vaccine 04/11/2020  3:35 PM 0.3 mL 02/13/2020 Intramuscular   Manufacturer: [place]   Lot: [id number]   [place]: [id number]

## 2020-06-02 ENCOUNTER — Encounter (HOSPITAL_COMMUNITY): Payer: Self-pay | Admitting: Emergency Medicine

## 2020-06-02 ENCOUNTER — Emergency Department (HOSPITAL_COMMUNITY)
Admission: EM | Admit: 2020-06-02 | Discharge: 2020-06-02 | Disposition: A | Discharge status: Home / Self Care | Payer: Medicare Other | Attending: Emergency Medicine | Admitting: Emergency Medicine

## 2020-06-02 ENCOUNTER — Emergency Department (HOSPITAL_COMMUNITY): Payer: Medicare Other

## 2020-06-02 DIAGNOSIS — F17210 Nicotine dependence, cigarettes, uncomplicated: Secondary | ICD-10-CM | POA: Insufficient documentation

## 2020-06-02 DIAGNOSIS — E860 Dehydration: Secondary | ICD-10-CM | POA: Insufficient documentation

## 2020-06-02 DIAGNOSIS — F13239 Sedative, hypnotic or anxiolytic dependence with withdrawal, unspecified: Secondary | ICD-10-CM | POA: Diagnosis not present

## 2020-06-02 DIAGNOSIS — F13939 Sedative, hypnotic or anxiolytic use, unspecified with withdrawal, unspecified: Secondary | ICD-10-CM

## 2020-06-02 DIAGNOSIS — R110 Nausea: Secondary | ICD-10-CM

## 2020-06-02 DIAGNOSIS — R112 Nausea with vomiting, unspecified: Secondary | ICD-10-CM | POA: Diagnosis present

## 2020-06-02 DIAGNOSIS — R109 Unspecified abdominal pain: Secondary | ICD-10-CM | POA: Diagnosis not present

## 2020-06-02 LAB — COMPREHENSIVE METABOLIC PANEL
ALT: 55 U/L — ABNORMAL HIGH (ref 0–44)
AST: 125 U/L — ABNORMAL HIGH (ref 15–41)
Albumin: 4.6 g/dL (ref 3.5–5.0)
Alkaline Phosphatase: 69 U/L (ref 38–126)
Anion gap: 22 — ABNORMAL HIGH (ref 5–15)
BUN: 14 mg/dL (ref 8–23)
CO2: 17 mmol/L — ABNORMAL LOW (ref 22–32)
Calcium: 8.6 mg/dL — ABNORMAL LOW (ref 8.9–10.3)
Chloride: 104 mmol/L (ref 98–111)
Creatinine, Ser: 0.77 mg/dL (ref 0.44–1.00)
GFR, Estimated: >60 mL/min (ref >60)
Glucose, Bld: 163 mg/dL — ABNORMAL HIGH (ref 70–99)
Potassium: 3.6 mmol/L (ref 3.5–5.1)
Sodium: 143 mmol/L (ref 135–145)
Total Bilirubin: 1.2 mg/dL (ref 0.3–1.2)
Total Protein: 8.1 g/dL (ref 6.5–8.1)

## 2020-06-02 LAB — URINALYSIS, ROUTINE W REFLEX MICROSCOPIC
Bacteria, UA: NONE SEEN
Bilirubin Urine: NEGATIVE
Glucose, UA: NEGATIVE mg/dL
Ketones, ur: 80 mg/dL — AB
Leukocytes,Ua: NEGATIVE
Nitrite: NEGATIVE
Protein, ur: 100 mg/dL — AB
Specific Gravity, Urine: 1.024 (ref 1.005–1.030)
pH: 5 (ref 5.0–8.0)

## 2020-06-02 LAB — BASIC METABOLIC PANEL
Anion gap: 11 (ref 5–15)
BUN: 11 mg/dL (ref 8–23)
CO2: 20 mmol/L — ABNORMAL LOW (ref 22–32)
Calcium: 6.7 mg/dL — ABNORMAL LOW (ref 8.9–10.3)
Chloride: 112 mmol/L — ABNORMAL HIGH (ref 98–111)
Creatinine, Ser: 0.52 mg/dL (ref 0.44–1.00)
GFR, Estimated: >60 mL/min (ref >60)
Glucose, Bld: 106 mg/dL — ABNORMAL HIGH (ref 70–99)
Potassium: 3 mmol/L — ABNORMAL LOW (ref 3.5–5.1)
Sodium: 143 mmol/L (ref 135–145)

## 2020-06-02 LAB — CBC
HCT: 45.2 % (ref 36.0–46.0)
Hemoglobin: 15.3 g/dL — ABNORMAL HIGH (ref 12.0–15.0)
MCH: 33.6 pg (ref 26.0–34.0)
MCHC: 33.8 g/dL (ref 30.0–36.0)
MCV: 99.1 fL (ref 80.0–100.0)
Platelets: 153 10*3/uL (ref 150–400)
RBC: 4.56 MIL/uL (ref 3.87–5.11)
RDW: 13.2 % (ref 11.5–15.5)
WBC: 13.2 10*3/uL — ABNORMAL HIGH (ref 4.0–10.5)
nRBC: 0 % (ref 0.0–0.2)

## 2020-06-02 LAB — MAGNESIUM: Magnesium: 1.1 mg/dL — ABNORMAL LOW (ref 1.7–2.4)

## 2020-06-02 LAB — LIPASE, BLOOD: Lipase: 36 U/L (ref 11–51)

## 2020-06-02 MED ORDER — LACTATED RINGERS IV BOLUS
2000.0000 mL | Freq: Once | INTRAVENOUS | Status: AC
  Administered 2020-06-02: 2000 mL via INTRAVENOUS

## 2020-06-02 MED ORDER — ONDANSETRON HCL 4 MG PO TABS: 4.0000 mg | ORAL_TABLET | Freq: Four times a day (QID) | ORAL | 0 refills | Status: DC

## 2020-06-02 MED ORDER — DROPERIDOL 2.5 MG/ML IJ SOLN
1.2500 mg | Freq: Once | INTRAMUSCULAR | Status: AC
  Administered 2020-06-02: 1.25 mg via INTRAVENOUS
  Filled 2020-06-02: qty 2

## 2020-06-02 MED ORDER — IOHEXOL 300 MG/ML  SOLN
100.0000 mL | Freq: Once | INTRAMUSCULAR | Status: AC | PRN
  Administered 2020-06-02: 100 mL via INTRAVENOUS

## 2020-06-02 MED ORDER — ONDANSETRON HCL 4 MG/2ML IJ SOLN
4.0000 mg | Freq: Once | INTRAMUSCULAR | Status: AC
  Administered 2020-06-02: 4 mg via INTRAVENOUS
  Filled 2020-06-02: qty 2

## 2020-06-02 MED ORDER — MAGNESIUM OXIDE 400 (241.3 MG) MG PO TABS
400.0000 mg | ORAL_TABLET | Freq: Once | ORAL | Status: AC
  Administered 2020-06-02: 400 mg via ORAL
  Filled 2020-06-02: qty 1

## 2020-06-02 MED ORDER — ONDANSETRON 4 MG PO TBDP
4.0000 mg | ORAL_TABLET | Freq: Once | ORAL | Status: AC | PRN
  Administered 2020-06-02: 4 mg via ORAL
  Filled 2020-06-02: qty 1

## 2020-06-02 MED ORDER — LORAZEPAM 2 MG/ML IJ SOLN
1.0000 mg | Freq: Once | INTRAMUSCULAR | Status: AC
  Administered 2020-06-02: 1 mg via INTRAVENOUS
  Filled 2020-06-02: qty 1

## 2020-06-02 MED ORDER — POTASSIUM CHLORIDE 10 MEQ/100ML IV SOLN
10.0000 meq | Freq: Once | INTRAVENOUS | Status: AC
  Administered 2020-06-02: 10 meq via INTRAVENOUS
  Filled 2020-06-02: qty 100

## 2020-06-02 MED ORDER — LORAZEPAM 2 MG/ML IJ SOLN
2.0000 mg | Freq: Once | INTRAMUSCULAR | Status: AC
  Administered 2020-06-02: 2 mg via INTRAVENOUS
  Filled 2020-06-02: qty 1

## 2020-06-02 MED ORDER — PROMETHAZINE HCL 25 MG/ML IJ SOLN
12.5000 mg | Freq: Once | INTRAMUSCULAR | Status: AC
  Administered 2020-06-02: 12.5 mg via INTRAVENOUS
  Filled 2020-06-02: qty 1

## 2020-06-02 MED ORDER — SODIUM CHLORIDE 0.9 % IV BOLUS (SEPSIS)
500.0000 mL | Freq: Once | INTRAVENOUS | Status: AC
  Administered 2020-06-02: 500 mL via INTRAVENOUS

## 2020-06-02 MED ORDER — CLONAZEPAM 0.5 MG PO TABS: 0.5000 mg | ORAL_TABLET | Freq: Two times a day (BID) | ORAL | 0 refills | Status: DC | PRN

## 2020-06-02 NOTE — ED Triage Notes (Signed)
Pt reports that she has not had her anxiety meds in 5 days, She reports that they are Celexa, Klonipin, and Seroquel. Endorses nausea. Pt dry heaving with EMS and in triage.

## 2020-06-02 NOTE — ED Provider Notes (Signed)
Pt signed out by Dr. Christy Gentles.  Pt given more fluids.  Repeat BMP shows an improving anion gap.  Pt given additional antiemetics and ativan.  Pt still having some abdominal pain, so I ordered a CT scan.    IMPRESSION:  1. Soft tissue prominence is noted in the proximal descending colon  without appreciable associated wall thickening or extension of soft  tissue beyond the colon wall. This area may represent localized  stool. There is slight narrowing of the sigmoid colon immediately  distal to this area, however. This finding may warrant colonoscopy  following appropriate colonic cleansing to assess for possible  lesion other than stool in this area.    2. No evident bowel obstruction. No abscess in the abdomen or  pelvis. Appendix appears normal.    3. Focal hiatal hernia.    4. Aortic Atherosclerosis (ICD10-I70.0).    5. Hepatic steatosis.    6. There is a degree of spinal stenosis at L4-5 due to bony  hypertrophy and central disc protrusion.     Pt has never had a colonoscopy.  She is referred to GI.    She is finally feeling better.  She ran out of her klonopin and is given a refill for #10.  She is stable for d/c.  Return if worse.   Debra Pence, MD 06/02/20 1454

## 2020-06-02 NOTE — ED Provider Notes (Signed)
[facility] DEPT Provider Note   CSN: [id number] Arrival date & time: 06/02/20  0241     History Chief Complaint  Patient presents with  . Nausea    Debra Jackson is a 65 y.o. female.  The history is provided by the spouse and the patient.  Anxiety This is a new problem. The problem occurs constantly. The problem has been gradually worsening. Nothing aggravates the symptoms. Nothing relieves the symptoms.  Patient with history of anxiety presents with nausea, vomiting and anxiety due to lack of medications.  Patient reports she has been without her anxiety meds including Klonopin for least 5 days.  She is now having anxiety, shaking, nausea vomiting.  No seizures or syncope reported.  She also reports generalized weakness     Past Medical History:  Diagnosis Date  . Anxiety   . Opiate abuse, continuous ([place])   . Polysubstance abuse ([place])     There are no problems to display for this patient.   Past Surgical History:  Procedure Laterality Date  . TONSILLECTOMY       OB History   No obstetric history on file.     History reviewed. No pertinent family history.  Social History   Tobacco Use  . Smoking status: Current Every Day Smoker    Types: Cigarettes  Substance Use Topics  . Alcohol use: Yes    Alcohol/week: 1.0 - 2.0 standard drink    Types: 1 - 2 Glasses of wine per week    Comment: daily  . Drug use: Yes    Types: Cocaine    Comment: heroin    Home Medications Prior to Admission medications   Medication Sig Start Date End Date Taking? Authorizing Provider  amphetamine-dextroamphetamine (ADDERALL) 10 MG tablet Take 20 mg by mouth 2 (two) times daily.    [provider]  clonazePAM (KLONOPIN) 0.5 MG tablet Take 1 mg by mouth 2 (two) times daily as needed for anxiety.    [provider]    Allergies    Penicillins  Review of Systems   Review of Systems  Constitutional: Negative for fever.   Gastrointestinal: Positive for nausea and vomiting.  Psychiatric/Behavioral: The patient is nervous/anxious.   All other systems reviewed and are negative.   Physical Exam Updated Vital Signs BP (!) 182/93 (BP Location: Right Arm)   Pulse (!) 112   Temp 97.7 F (36.5 C) (Oral)   Resp (!) 27   Ht 1.676 m (5\' 6" )   Wt 81.6 kg   SpO2 97%   BMI 29.05 kg/m   Physical Exam CONSTITUTIONAL: Elderly and disheveled, anxious HEAD: Normocephalic/atraumatic EYES: EOMI/PERRL ENMT: Mucous membranes moist NECK: supple no meningeal signs SPINE/BACK:entire spine nontender CV: S1/S2 noted, tachycardic LUNGS: Lungs are clear to auscultation bilaterally, no apparent distress ABDOMEN: soft, nontender, no rebound or guarding, bowel sounds noted throughout abdomen GU:no cva tenderness NEURO: Pt is awake/alert/appropriate, moves all extremitiesx4.  No facial droop.   EXTREMITIES: pulses normal/equal, full ROM, no deformities SKIN: warm, color normal PSYCH: Anxious  ED Results / Procedures / Treatments   Labs (all labs ordered are listed, but only abnormal results are displayed) Labs Reviewed  COMPREHENSIVE METABOLIC PANEL - Abnormal; Notable for the following components:      Result Value   CO2 17 (*)    Glucose, Bld 163 (*)    Calcium 8.6 (*)    AST 125 (*)    ALT 55 (*)    Anion gap 22 (*)  All other components within normal limits  CBC - Abnormal; Notable for the following components:   WBC 13.2 (*)    Hemoglobin 15.3 (*)    All other components within normal limits  LIPASE, BLOOD  URINALYSIS, ROUTINE W REFLEX MICROSCOPIC    EKG ED ECG REPORT   Date: 06/02/2020 0258am  Rate: 112  Rhythm: sinus tachycardia  QRS Axis: left  Intervals: normal  ST/T Wave abnormalities: LVH with repol  Conduction Disutrbances:nonspecific intraventricular conduction delay   I have personally reviewed the EKG tracing and agree with the computerized printout as noted.  Radiology No results  found.  Procedures Procedures   Medications Ordered in ED Medications  LORazepam (ATIVAN) injection 1 mg (has no administration in time range)  sodium chloride 0.9 % bolus 500 mL (has no administration in time range)  ondansetron (ZOFRAN-ODT) disintegrating tablet 4 mg (4 mg Oral Given 06/02/20 0308)  LORazepam (ATIVAN) injection 2 mg (2 mg Intravenous Given 06/02/20 0551)  ondansetron (ZOFRAN) injection 4 mg (4 mg Intravenous Given 06/02/20 0552)  lactated ringers bolus 2,000 mL (2,000 mLs Intravenous New Bag/Given 06/02/20 0558)    ED Course  I have reviewed the triage vital signs and the nursing notes.  Pertinent labs & imaging results that were available during my care of the patient were reviewed by me and considered in my medical decision making (see chart for details).    MDM Rules/Calculators/A&P                          5:25 AM Patient presenting with probable benzodiazepine withdrawal but no seizures reported.  Will give patient Ativan and reassess. 7:00 AM Patient is improving.  Heart rate is improving as well as her tremor. Labs reveal anion gap will likely improve with fluids BP (!) 167/76   Pulse (!) 101   Temp 97.7 F (36.5 C) (Oral)   Resp (!) 22   Ht 1.676 m (5\' 6" )   Wt 81.6 kg   SpO2 93%   BMI 29.05 kg/m  Patient reports the only medicine that she is without at this time is Klonopin All other meds she has had refilled.  She plans to follow-up later today to get this medication refilled Husband reports the patient does drink alcohol every day.  Patient denies excessive alcohol use.  I counseled her on dangers of using medications with alcohol.  Plan at signout to Dr. Gilford Raid to f/u on repeat BMP and if improved she can be discharged.  Patient plans to follow-up with her psychiatrist  Final Clinical Impression(s) / ED Diagnoses Final diagnoses:  Benzodiazepine withdrawal with complication [facility])  Dehydration    Rx / DC Orders ED Discharge Orders    None        Ripley Fraise, MD 06/02/20 [phone]

## 2020-08-06 ENCOUNTER — Encounter: Payer: Self-pay | Admitting: Physician Assistant

## 2020-08-06 ENCOUNTER — Ambulatory Visit (INDEPENDENT_AMBULATORY_CARE_PROVIDER_SITE_OTHER): Payer: Medicare Other | Admitting: Physician Assistant

## 2020-08-06 VITALS — BP 140/84 | HR 95 | Ht 66.0 in | Wt 190.0 lb

## 2020-08-06 DIAGNOSIS — R935 Abnormal findings on diagnostic imaging of other abdominal regions, including retroperitoneum: Secondary | ICD-10-CM | POA: Diagnosis not present

## 2020-08-06 DIAGNOSIS — R194 Change in bowel habit: Secondary | ICD-10-CM

## 2020-08-06 MED ORDER — NA SULFATE-K SULFATE-MG SULF 17.5-3.13-1.6 GM/177ML PO SOLN: 1.0000 | Freq: Once | ORAL | 0 refills | Status: AC

## 2020-08-06 NOTE — Progress Notes (Signed)
Chief Complaint: Abnormal CT of the abdomen, change in bowel habits  HPI:    [name] is a 65 year old Caucasian female with a past medical history as listed below including anxiety and reflux, who presents to clinic today as a referral from the ER after having an abnormal CT of the abdomen.    06/02/2020 patient seen in the ER for anxiety, shaking and nausea/vomiting.  She had been without her Klonopin for 5 days.  Labs at that time showed a white count of 13.2.  Lipase and urinalysis normal.  CMP did show elevated LFTs AST 125 and ALT of 55.  At that time has been reported that patient drink alcohol every day.  She had a CT the abdomen pelvis which showed soft tissue prominence noted in the proximal descending colon without appreciable associated wall thickening or extension of soft tissue beyond the colon wall.  The area may represent localized stool.  Also slight narrowing of the sigmoid colon immediately distal to this area.  Was recommended she have a colonoscopy.  No evident bowel obstruction.  No abscess in the abdomen or pelvis.  Focal hiatal hernia.  Hepatic steatosis and a degree of spinal stenosis at L4-5.    Today, the patient presents to clinic accompanied by her husband.  She tells me that since being seen in the ER everything is better.  She was having some diarrhea and nausea and vomiting, but she has not vomited for the past month.  Does tell me that her stools are now more formed and less frequent.  She will still occasionally have a loose one but this is not all the time.  Also describes a chronic right lower quadrant pain which comes off and on and will last for 5 to 10 minutes.  It can go away for months and then come back again.  Apparently, she has had this for the past 57 years ever since she had a "cyst burst" on her ovary years ago.    Does endorse very occasional heartburn.    Denies fever, chills, blood in her stool, weight loss, continued nausea or vomiting.  Past Medical  History:  Diagnosis Date  . Anxiety   . GERD (gastroesophageal reflux disease)   . Opiate abuse, continuous ([place])   . Polysubstance abuse [facility])     Past Surgical History:  Procedure Laterality Date  . TONSILLECTOMY      Current Outpatient Medications  Medication Sig Dispense Refill  . amphetamine-dextroamphetamine (ADDERALL) 10 MG tablet Take 20 mg by mouth 2 (two) times daily.    . clonazePAM (KLONOPIN) 0.5 MG tablet Take 1 mg by mouth 2 (two) times daily as needed for anxiety.    . clonazePAM (KLONOPIN) 0.5 MG tablet Take 1 tablet (0.5 mg total) by mouth 2 (two) times daily as needed for anxiety. 10 tablet 0  . ondansetron (ZOFRAN) 4 MG tablet Take 1 tablet (4 mg total) by mouth every 6 (six) hours. 12 tablet 0   No current facility-administered medications for this visit.    Allergies as of 08/06/2020 - Review Complete 06/02/2020  Allergen Reaction Noted  . Penicillins Nausea And Vomiting 09/01/2012    History reviewed. No pertinent family history.  Social History   Socioeconomic History  . Marital status: Single    Spouse name: Not on file  . Number of children: Not on file  . Years of education: Not on file  . Highest education level: Not on file  Occupational History  .  Not on file  Tobacco Use  . Smoking status: Current Every Day Smoker    Types: Cigarettes  . Smokeless tobacco: Never Used  Substance and Sexual Activity  . Alcohol use: Yes    Alcohol/week: 1.0 - 2.0 standard drink    Types: 1 - 2 Glasses of wine per week    Comment: daily  . Drug use: Yes    Types: Cocaine    Comment: heroin  . Sexual activity: Not on file  Other Topics Concern  . Not on file  Social History Narrative  . Not on file   Social Determinants of Health   Financial Resource Strain: Not on file  Food Insecurity: Not on file  Transportation Needs: Not on file  Physical Activity: Not on file  Stress: Not on file  Social Connections: Not on file  Intimate Partner  Violence: Not on file    Review of Systems:    Constitutional: No weight loss, fever or chills Skin: No rash  Cardiovascular: No chest pain Respiratory: No SOB Gastrointestinal: See HPI and otherwise negative Genitourinary: No dysuria  Neurological: No headache, dizziness or syncope Musculoskeletal: No new muscle or joint pain Hematologic: No bleeding Psychiatric: +anxiety   Physical Exam:  Vital signs: BP 140/84 (BP Location: Left Arm, Patient Position: Sitting)   Pulse 95   Ht 5\' 6"  (1.676 m)   Wt 190 lb (86.2 kg)   SpO2 96%   BMI 30.67 kg/m   Constitutional:   Pleasant Caucasian female appears to be in NAD, Well developed, Well nourished, alert and cooperative Head:  Normocephalic and atraumatic. Eyes:   PEERL, EOMI. No icterus. Conjunctiva pink. Ears:  Normal auditory acuity. Neck:  Supple Throat: Oral cavity and pharynx without inflammation, swelling or lesion.  Respiratory: Respirations even and unlabored. Lungs clear to auscultation bilaterally.   No wheezes, crackles, or rhonchi.  Cardiovascular: Normal S1, S2. No MRG. Regular rate and rhythm. No peripheral edema, cyanosis or pallor.  Gastrointestinal:  Soft, mild distention, nontender. No rebound or guarding. Normal bowel sounds. No appreciable masses or hepatomegaly. Rectal:  Not performed.  Msk:  Symmetrical without gross deformities. Without edema, no deformity or joint abnormality.  Neurologic:  Alert and  oriented x4;  grossly normal neurologically.  Skin:   Dry and intact without significant lesions or rashes. Psychiatric:  Demonstrates good judgement and reason without abnormal affect or behaviors.  RELEVANT LABS AND IMAGING: CBC    Component Value Date/Time   WBC 13.2 (H) 06/02/2020 0447   RBC 4.56 06/02/2020 0447   HGB 15.3 (H) 06/02/2020 0447   HCT 45.2 06/02/2020 0447   PLT 153 06/02/2020 0447   MCV 99.1 06/02/2020 0447   MCH 33.6 06/02/2020 0447   MCHC 33.8 06/02/2020 0447   RDW 13.2 06/02/2020  0447   LYMPHSABS 0.8 01/28/2020 1640   MONOABS 0.7 01/28/2020 1640   EOSABS 0.0 01/28/2020 1640   BASOSABS 0.1 01/28/2020 1640    CMP     Component Value Date/Time   NA 143 06/02/2020 1203   K 3.0 (L) 06/02/2020 1203   CL 112 (H) 06/02/2020 1203   CO2 20 (L) 06/02/2020 1203   GLUCOSE 106 (H) 06/02/2020 1203   BUN 11 06/02/2020 1203   CREATININE 0.52 06/02/2020 1203   CALCIUM 6.7 (L) 06/02/2020 1203   PROT 8.1 06/02/2020 0447   ALBUMIN 4.6 06/02/2020 0447   AST 125 (H) 06/02/2020 0447   ALT 55 (H) 06/02/2020 0447   ALKPHOS 69 06/02/2020 0447  BILITOT 1.2 06/02/2020 0447   GFRNONAA >60 06/02/2020 1203   GFRAA >60 01/28/2020 1640    Assessment: 1.  Abnormal CT of the abdomen: See HPI for details, questionable soft tissue prominence in the descending colon; consider stool versus other 2.  Change in bowel habits: Towards looser initially, now gaining some form per the patient; consider IBS versus relation to alcohol usage versus other  Plan: 1.  Scheduled patient for diagnostic colonoscopy in the [place] with Dr. Bryan Lemma as he had sooner availability.  Did provide the patient with a detailed list of risks for procedure and she agrees to proceed. 2.  Patient to follow in clinic per recommendations from Dr. Bryan Lemma after time of procedure.  Ellouise Newer, PA-C [facility] 08/06/2020, 9:42 AM

## 2020-08-06 NOTE — Patient Instructions (Signed)
If you are age 67 or older, your body mass index should be between 23-30. Your Body mass index is 30.67 kg/m. If this is out of the aforementioned range listed, please consider follow up with your Primary Care Provider.  If you are age 54 or younger, your body mass index should be between 19-25. Your Body mass index is 30.67 kg/m. If this is out of the aformentioned range listed, please consider follow up with your Primary Care Provider.   You have been scheduled for a colonoscopy. Please follow written instructions given to you at your visit today.  Please pick up your prep supplies at the pharmacy within the next 1-3 days. If you use inhalers (even only as needed), please bring them with you on the day of your procedure.  It was a pleasure to see you today!  Ellouise Newer, PA-C

## 2020-08-19 NOTE — Progress Notes (Signed)
Agree with the assessment and plan as outlined by Jennifer Lemmon, PA-C. ? ?Damond Borchers, DO, FACG ? ?

## 2020-09-10 ENCOUNTER — Encounter: Payer: Self-pay | Admitting: Gastroenterology

## 2020-09-17 ENCOUNTER — Ambulatory Visit (AMBULATORY_SURGERY_CENTER): Payer: Medicare Other | Admitting: Gastroenterology

## 2020-09-17 ENCOUNTER — Other Ambulatory Visit: Payer: Medicare Other

## 2020-09-17 ENCOUNTER — Other Ambulatory Visit: Payer: Self-pay | Admitting: Gastroenterology

## 2020-09-17 ENCOUNTER — Other Ambulatory Visit: Payer: Self-pay

## 2020-09-17 ENCOUNTER — Encounter: Payer: Self-pay | Admitting: Gastroenterology

## 2020-09-17 VITALS — BP 124/71 | HR 69 | Temp 97.9°F | Resp 12 | Ht 66.0 in | Wt 190.0 lb

## 2020-09-17 DIAGNOSIS — D128 Benign neoplasm of rectum: Secondary | ICD-10-CM

## 2020-09-17 DIAGNOSIS — D122 Benign neoplasm of ascending colon: Secondary | ICD-10-CM

## 2020-09-17 DIAGNOSIS — D125 Benign neoplasm of sigmoid colon: Secondary | ICD-10-CM | POA: Diagnosis not present

## 2020-09-17 DIAGNOSIS — K640 First degree hemorrhoids: Secondary | ICD-10-CM | POA: Diagnosis not present

## 2020-09-17 DIAGNOSIS — R935 Abnormal findings on diagnostic imaging of other abdominal regions, including retroperitoneum: Secondary | ICD-10-CM

## 2020-09-17 DIAGNOSIS — D124 Benign neoplasm of descending colon: Secondary | ICD-10-CM

## 2020-09-17 MED ORDER — SODIUM CHLORIDE 0.9 % IV SOLN: 500.0000 mL | Freq: Once | INTRAVENOUS | Status: DC

## 2020-09-17 NOTE — Patient Instructions (Signed)
Please read handouts provided. Continue present medications. Await pathology results. Return to GI clinic in 2-3 weeks.     YOU HAD AN ENDOSCOPIC PROCEDURE TODAY AT [facility]:   Refer to the procedure report that was given to you for any specific questions about what was found during the examination.  If the procedure report does not answer your questions, please call your gastroenterologist to clarify.  If you requested that your care partner not be given the details of your procedure findings, then the procedure report has been included in a sealed envelope for you to review at your convenience later.  YOU SHOULD EXPECT: Some feelings of bloating in the abdomen. Passage of more gas than usual.  Walking can help get rid of the air that was put into your GI tract during the procedure and reduce the bloating. If you had a lower endoscopy (such as a colonoscopy or flexible sigmoidoscopy) you may notice spotting of blood in your stool or on the toilet paper. If you underwent a bowel prep for your procedure, you may not have a normal bowel movement for a few days.  Please Note:  You might notice some irritation and congestion in your nose or some drainage.  This is from the oxygen used during your procedure.  There is no need for concern and it should clear up in a day or so.  SYMPTOMS TO REPORT IMMEDIATELY:   Following lower endoscopy (colonoscopy or flexible sigmoidoscopy):  Excessive amounts of blood in the stool  Significant tenderness or worsening of abdominal pains  Swelling of the abdomen that is new, acute  Fever of 100F or higher   For urgent or emergent issues, a gastroenterologist can be reached at any hour by calling [phone]. Do not use MyChart messaging for urgent concerns.    DIET:  We do recommend a small meal at first, but then you may proceed to your regular diet.  Drink plenty of fluids but you should avoid alcoholic beverages for 24  hours.  ACTIVITY:  You should plan to take it easy for the rest of today and you should NOT DRIVE or use heavy machinery until tomorrow (because of the sedation medicines used during the test).    FOLLOW UP: Our staff will call the number listed on your records 48-72 hours following your procedure to check on you and address any questions or concerns that you may have regarding the information given to you following your procedure. If we do not reach you, we will leave a message.  We will attempt to reach you two times.  During this call, we will ask if you have developed any symptoms of COVID 19. If you develop any symptoms (ie: fever, flu-like symptoms, shortness of breath, cough etc.) before then, please call [phone].  If you test positive for Covid 19 in the 2 weeks post procedure, please call and report this information to [place].    If any biopsies were taken you will be contacted by phone or by letter within the next 1-3 weeks.  Please call us at [phone] if you have not heard about the biopsies in 3 weeks.    SIGNATURES/CONFIDENTIALITY: You and/or your care partner have signed paperwork which will be entered into your electronic medical record.  These signatures attest to the fact that that the information above on your After Visit Summary has been reviewed and is understood.  Full responsibility of the confidentiality of this discharge information lies with you  and/or your care-partner. 

## 2020-09-17 NOTE — Progress Notes (Signed)
Report to PACU, RN, vss, BBS= Clear.  

## 2020-09-17 NOTE — Op Note (Signed)
[place] Patient Name: Debra Jackson Procedure Date: 09/17/2020 8:33 AM MRN: [id number] Endoscopist: Gerrit Heck , MD Age: 65 Referring MD:  Date of Birth: 04-14-1956 Gender: Female Account #: [id number] Procedure:                Colonoscopy Indications:              This is the patient's first colonoscopy, Abnormal                            CT of the GI tract Medicines:                Monitored Anesthesia Care Procedure:                Pre-Anesthesia Assessment:                           - Prior to the procedure, a History and Physical                            was performed, and patient medications and                            allergies were reviewed. The patient's tolerance of                            previous anesthesia was also reviewed. The risks                            and benefits of the procedure and the sedation                            options and risks were discussed with the patient.                            All questions were answered, and informed consent                            was obtained. Prior Anticoagulants: The patient has                            taken no previous anticoagulant or antiplatelet                            agents. ASA Grade Assessment: III - A patient with                            severe systemic disease. After reviewing the risks                            and benefits, the patient was deemed in                            satisfactory condition to undergo the procedure.  After obtaining informed consent, the colonoscope                            was passed under direct vision. Throughout the                            procedure, the patient's blood pressure, pulse, and                            oxygen saturations were monitored continuously. The                            Olympus CF-HQ190 [id number]) Colonoscope was                            introduced through the anus and advanced to  the the                            cecum, identified by appendiceal orifice and                            ileocecal valve. The colonoscopy was technically                            difficult and complex due to significant looping.                            The patient tolerated the procedure well. The                            quality of the bowel preparation was good. The                            ileocecal valve, appendiceal orifice, and rectum                            were photographed. Scope In: 8:42:02 AM Scope Out: 9:26:59 AM Scope Withdrawal Time: 0 hours 37 minutes 5 seconds  Total Procedure Duration: 0 hours 44 minutes 57 seconds  Findings:                 The perianal and digital rectal examinations were                            normal.                           A 15 mm polyp was found in the ascending colon. The                            polyp was sessile. The polyp was removed with a                            cold snare. Resection and retrieval were complete.  Estimated blood loss was minimal.                           A 12 mm polyp was found in the descending colon.                            The polyp was pedunculated. The polyp was removed                            with a hot snare. Resection and retrieval were                            complete. Estimated blood loss was minimal.                           A frond-like/villous partially obstructing mass was                            found in the sigmoid colon. The mass was partially                            circumferential (involving one-half of the lumen                            circumference). The mass measured four cm in                            length, located 24-28 cm from the anal verge. No                            bleeding was present. This was biopsied with a cold                            forceps for histology. Area 1 cm distal to the                             lesion was tattooed with an injection of 1 mL of                            Spot (carbon black). Estimated blood loss was                            minimal.                           A 18 mm polyp was found in the sigmoid colon. The                            polyp was pedunculated. The polyp was removed with                            a hot snare. Resection and retrieval were complete.  Estimated blood loss: none.                           Two sessile polyps were found in the rectum. The                            polyps were 3 to 5 mm in size. These polyps were                            removed with a cold snare. Resection and retrieval                            were complete. Estimated blood loss was minimal.                           Non-bleeding internal hemorrhoids were found during                            retroflexion. The hemorrhoids were small.                           The ascending colon revealed significantly                            excessive looping. Advancing the scope required                            using manual pressure. Complications:            No immediate complications. Estimated Blood Loss:     Estimated blood loss was minimal. Impression:               - One 15 mm polyp in the ascending colon, removed                            with a cold snare. Resected and retrieved.                           - One 12 mm polyp in the descending colon, removed                            with a hot snare. Resected and retrieved.                           - Partially obstructing tumor in the sigmoid colon.                            Biopsied. Tattooed.                           - One 18 mm polyp in the sigmoid colon, removed                            with a hot snare. Resected and retrieved.                           -  Two 3 to 5 mm polyps in the rectum, removed with                            a cold snare. Resected and retrieved.                            - Non-bleeding internal hemorrhoids.                           - There was significant looping of the colon. Recommendation:           - Patient has a contact number available for                            emergencies. The signs and symptoms of potential                            delayed complications were discussed with the                            patient. Return to normal activities tomorrow.                            Written discharge instructions were provided to the                            patient.                           - Resume previous diet.                           - Continue present medications.                           - Await pathology results.                           - Perform CT scan (computed tomography) of the                            chest with contrast at appointment to be scheduled.                           - Check CEA.                           - Refer to a colo-rectal surgeon at appointment to                            be scheduled.                           - Return to GI clinic in 2-3 weeks. Gerrit Heck, MD 09/17/2020 9:38:19 AM

## 2020-09-17 NOTE — Progress Notes (Signed)
Cw vitals and SF IV.

## 2020-09-17 NOTE — Progress Notes (Signed)
Called to room to assist during endoscopic procedure.  Patient ID and intended procedure confirmed with present staff. Received instructions for my participation in the procedure from the performing physician.  

## 2020-09-18 ENCOUNTER — Telehealth: Payer: Self-pay

## 2020-09-18 DIAGNOSIS — R935 Abnormal findings on diagnostic imaging of other abdominal regions, including retroperitoneum: Secondary | ICD-10-CM

## 2020-09-18 DIAGNOSIS — K6389 Other specified diseases of intestine: Secondary | ICD-10-CM

## 2020-09-18 LAB — CEA: CEA: 0.8 ng/mL

## 2020-09-18 NOTE — Telephone Encounter (Signed)
Per 09/17/20 procedure note:  - Perform CT scan of chest with contrast - order in epic. Message sent to radiology schedulers ( April Pait, Rhys Martini). - Check CEA - this was drawn yesterday - Refer to colorectal surgeon - Referral, records, demographic and insurance information faxed to [facility]. - Return to GI clinic in 2-3 weeks - Follow up with Dr. Bryan Lemma is Wednesday, 10/08/20 at 10 am.  Lm on mobile vm for patient to return call.

## 2020-09-19 ENCOUNTER — Telehealth: Payer: Self-pay | Admitting: *Deleted

## 2020-09-19 NOTE — Telephone Encounter (Signed)
First attempt, left VM.  

## 2020-09-24 NOTE — Telephone Encounter (Signed)
Called listed home number twice, receive an automated message that "Your call can't be completed as dialed."  Lm on mobile vm for patient to return call.

## 2020-10-01 NOTE — Telephone Encounter (Signed)
Lm on vm for patient to return call 

## 2020-10-06 NOTE — Telephone Encounter (Signed)
No return call received. Will mail letter.  

## 2020-10-08 ENCOUNTER — Ambulatory Visit: Payer: Medicare Other | Admitting: Gastroenterology

## 2021-02-07 ENCOUNTER — Other Ambulatory Visit: Payer: Self-pay

## 2021-02-07 ENCOUNTER — Emergency Department (HOSPITAL_COMMUNITY)
Admission: EM | Admit: 2021-02-07 | Discharge: 2021-02-10 | Disposition: A | Discharge status: Other Acute Inpatient Hospital | Payer: Medicare Other | Attending: Emergency Medicine | Admitting: Emergency Medicine

## 2021-02-07 DIAGNOSIS — F339 Major depressive disorder, recurrent, unspecified: Secondary | ICD-10-CM

## 2021-02-07 DIAGNOSIS — T424X2A Poisoning by benzodiazepines, intentional self-harm, initial encounter: Secondary | ICD-10-CM | POA: Insufficient documentation

## 2021-02-07 DIAGNOSIS — Z20822 Contact with and (suspected) exposure to covid-19: Secondary | ICD-10-CM | POA: Insufficient documentation

## 2021-02-07 DIAGNOSIS — Y906 Blood alcohol level of 120-199 mg/100 ml: Secondary | ICD-10-CM | POA: Insufficient documentation

## 2021-02-07 DIAGNOSIS — F17210 Nicotine dependence, cigarettes, uncomplicated: Secondary | ICD-10-CM | POA: Diagnosis not present

## 2021-02-07 DIAGNOSIS — T1491XA Suicide attempt, initial encounter: Secondary | ICD-10-CM

## 2021-02-07 LAB — ETHANOL: Alcohol, Ethyl (B): 176 mg/dL — ABNORMAL HIGH (ref <10)

## 2021-02-07 LAB — COMPREHENSIVE METABOLIC PANEL
ALT: 37 U/L (ref 0–44)
AST: 64 U/L — ABNORMAL HIGH (ref 15–41)
Albumin: 3.7 g/dL (ref 3.5–5.0)
Alkaline Phosphatase: 79 U/L (ref 38–126)
Anion gap: 11 (ref 5–15)
BUN: 11 mg/dL (ref 8–23)
CO2: 25 mmol/L (ref 22–32)
Calcium: 8.5 mg/dL — ABNORMAL LOW (ref 8.9–10.3)
Chloride: 107 mmol/L (ref 98–111)
Creatinine, Ser: 0.72 mg/dL (ref 0.44–1.00)
GFR, Estimated: >60 mL/min (ref >60)
Glucose, Bld: 92 mg/dL (ref 70–99)
Potassium: 2.9 mmol/L — ABNORMAL LOW (ref 3.5–5.1)
Sodium: 143 mmol/L (ref 135–145)
Total Bilirubin: 0.6 mg/dL (ref 0.3–1.2)
Total Protein: 7.2 g/dL (ref 6.5–8.1)

## 2021-02-07 LAB — RESP PANEL BY RT-PCR (FLU A&B, COVID) ARPGX2
Influenza A by PCR: NEGATIVE
Influenza B by PCR: NEGATIVE
SARS Coronavirus 2 by RT PCR: NEGATIVE

## 2021-02-07 LAB — CBC WITH DIFFERENTIAL/PLATELET
Abs Immature Granulocytes: 0.02 10*3/uL (ref 0.00–0.07)
Basophils Absolute: 0.1 10*3/uL (ref 0.0–0.1)
Basophils Relative: 1 %
Eosinophils Absolute: 0.1 10*3/uL (ref 0.0–0.5)
Eosinophils Relative: 2 %
HCT: 36.4 % (ref 36.0–46.0)
Hemoglobin: 12.4 g/dL (ref 12.0–15.0)
Immature Granulocytes: 0 %
Lymphocytes Relative: 27 %
Lymphs Abs: 1.5 10*3/uL (ref 0.7–4.0)
MCH: 34.5 pg — ABNORMAL HIGH (ref 26.0–34.0)
MCHC: 34.1 g/dL (ref 30.0–36.0)
MCV: 101.4 fL — ABNORMAL HIGH (ref 80.0–100.0)
Monocytes Absolute: 0.5 10*3/uL (ref 0.1–1.0)
Monocytes Relative: 9 %
Neutro Abs: 3.4 10*3/uL (ref 1.7–7.7)
Neutrophils Relative %: 61 %
Platelets: 173 10*3/uL (ref 150–400)
RBC: 3.59 MIL/uL — ABNORMAL LOW (ref 3.87–5.11)
RDW: 16 % — ABNORMAL HIGH (ref 11.5–15.5)
WBC: 5.5 10*3/uL (ref 4.0–10.5)
nRBC: 0 % (ref 0.0–0.2)

## 2021-02-07 LAB — MAGNESIUM: Magnesium: 1.6 mg/dL — ABNORMAL LOW (ref 1.7–2.4)

## 2021-02-07 LAB — I-STAT CHEM 8, ED
BUN: 9 mg/dL (ref 8–23)
Calcium, Ion: 1.09 mmol/L — ABNORMAL LOW (ref 1.15–1.40)
Chloride: 105 mmol/L (ref 98–111)
Creatinine, Ser: 0.8 mg/dL (ref 0.44–1.00)
Glucose, Bld: 90 mg/dL (ref 70–99)
HCT: 36 % (ref 36.0–46.0)
Hemoglobin: 12.2 g/dL (ref 12.0–15.0)
Potassium: 3.1 mmol/L — ABNORMAL LOW (ref 3.5–5.1)
Sodium: 145 mmol/L (ref 135–145)
TCO2: 25 mmol/L (ref 22–32)

## 2021-02-07 LAB — ACETAMINOPHEN LEVEL: Acetaminophen (Tylenol), Serum: <10 ug/mL — ABNORMAL LOW (ref 10–30)

## 2021-02-07 LAB — SALICYLATE LEVEL: Salicylate Lvl: <7 mg/dL — ABNORMAL LOW (ref 7.0–30.0)

## 2021-02-07 MED ORDER — MAGNESIUM SULFATE 2 GM/50ML IV SOLN
2.0000 g | Freq: Once | INTRAVENOUS | Status: AC
  Administered 2021-02-07: 2 g via INTRAVENOUS
  Filled 2021-02-07: qty 50

## 2021-02-07 MED ORDER — THIAMINE HCL 100 MG/ML IJ SOLN: 100.0000 mg | Freq: Every day | INTRAMUSCULAR | Status: DC

## 2021-02-07 MED ORDER — LORAZEPAM 1 MG PO TABS: 0.0000 mg | ORAL_TABLET | Freq: Two times a day (BID) | ORAL | Status: DC

## 2021-02-07 MED ORDER — POTASSIUM CHLORIDE 20 MEQ PO PACK
40.0000 meq | PACK | Freq: Once | ORAL | Status: AC
  Administered 2021-02-08: 40 meq via ORAL
  Filled 2021-02-07: qty 2

## 2021-02-07 MED ORDER — IBUPROFEN 200 MG PO TABS: 600.0000 mg | ORAL_TABLET | Freq: Three times a day (TID) | ORAL | Status: DC | PRN

## 2021-02-07 MED ORDER — LACTATED RINGERS IV BOLUS
1000.0000 mL | Freq: Once | INTRAVENOUS | Status: AC
  Administered 2021-02-07: 1000 mL via INTRAVENOUS

## 2021-02-07 MED ORDER — THIAMINE HCL 100 MG/ML IJ SOLN
100.0000 mg | Freq: Once | INTRAMUSCULAR | Status: AC
  Administered 2021-02-07: 100 mg via INTRAVENOUS
  Filled 2021-02-07: qty 2

## 2021-02-07 MED ORDER — LORAZEPAM 2 MG/ML IJ SOLN: 0.0000 mg | Freq: Two times a day (BID) | INTRAMUSCULAR | Status: DC

## 2021-02-07 MED ORDER — POTASSIUM CHLORIDE 20 MEQ PO PACK
40.0000 meq | PACK | Freq: Once | ORAL | Status: AC
  Administered 2021-02-07: 40 meq via ORAL
  Filled 2021-02-07: qty 2

## 2021-02-07 MED ORDER — LORAZEPAM 2 MG/ML IJ SOLN: 0.0000 mg | Freq: Four times a day (QID) | INTRAMUSCULAR | Status: AC

## 2021-02-07 MED ORDER — LORAZEPAM 1 MG PO TABS: 0.0000 mg | ORAL_TABLET | Freq: Four times a day (QID) | ORAL | Status: AC

## 2021-02-07 MED ORDER — THIAMINE HCL 100 MG PO TABS
100.0000 mg | ORAL_TABLET | Freq: Every day | ORAL | Status: DC
  Administered 2021-02-08 – 2021-02-09 (×2): 100 mg via ORAL
  Filled 2021-02-07 (×2): qty 1

## 2021-02-07 NOTE — ED Notes (Signed)
Call to [place] at [phone]; repeat EKG at end of observation time (11pm) and replace potassium. Monitor ETOH and tylenol level and poison control will follow up to discuss results. Notified MD of call.
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
## 2021-02-07 NOTE — ED Notes (Addendum)
F/U conversation with Poison Control to discuss pt labs and status. Per PC nurse Leah, repeat EKG and call if there are any concerns. Notified Dr. Doren Custard of [facility] recommendations.
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
## 2021-02-07 NOTE — ED Notes (Signed)
Pt was incontinent of urine. RN provided bedside care and changed linens, applied purewick and brief, and gave pt warm blankets for comfort. Door to room remains open for safety monitoring. Pt is in NAD at this time and is sleeping restlessly in bed.

## 2021-02-07 NOTE — ED Notes (Signed)
Removed pt's clothing (sweatshirt, pants, brown shoes) and placed in pt belongings bag. Pt has personal medication from home (citalopram x 28 tablets) also in belongings bag, labeled with pt sticker.

## 2021-02-08 LAB — BASIC METABOLIC PANEL
Anion gap: 10 (ref 5–15)
BUN: 10 mg/dL (ref 8–23)
CO2: 24 mmol/L (ref 22–32)
Calcium: 7.9 mg/dL — ABNORMAL LOW (ref 8.9–10.3)
Chloride: 106 mmol/L (ref 98–111)
Creatinine, Ser: 0.71 mg/dL (ref 0.44–1.00)
GFR, Estimated: >60 mL/min (ref >60)
Glucose, Bld: 80 mg/dL (ref 70–99)
Potassium: 3.3 mmol/L — ABNORMAL LOW (ref 3.5–5.1)
Sodium: 140 mmol/L (ref 135–145)

## 2021-02-08 LAB — MAGNESIUM
Magnesium: 2 mg/dL (ref 1.7–2.4)
Magnesium: 7.2 mg/dL (ref 1.7–2.4)

## 2021-02-08 MED ORDER — LOPERAMIDE HCL 2 MG PO CAPS
4.0000 mg | ORAL_CAPSULE | Freq: Once | ORAL | Status: AC
  Administered 2021-02-08: 4 mg via ORAL
  Filled 2021-02-08: qty 2
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
## 2021-02-08 NOTE — ED Notes (Signed)
Pt is talking to tts

## 2021-02-08 NOTE — ED Provider Notes (Signed)
Emergency Medicine Observation Re-evaluation Note  Debra Jackson is a 65 y.o. female, seen on rounds today.  Pt initially presented to the ED for complaints of Suicide Attempt Currently, the patient is resting comfortably.  Physical Exam  BP (!) 154/99 (BP Location: Right Arm)   Pulse 73   Temp 97.7 F (36.5 C) (Oral)   Resp 19   Ht 5\' 6"  (1.676 m)   Wt 90.7 kg   SpO2 99%   BMI 32.28 kg/m  Physical Exam General: No acute distress Cardiac: Regular rhythm Lungs: No respiratory distress Psych: Calm  ED Course / MDM  EKG:EKG Interpretation  Date/Time:  Saturday February 07 2021 17:19:42 EDT Ventricular Rate:  66 PR Interval:  187 QRS Duration: 117 QT Interval:  476 QTC Calculation: 499 R Axis:   -36 Text Interpretation: Sinus rhythm Nonspecific IVCD with LAD Low voltage, precordial leads LVH with secondary repolarization abnormality Anterior Q waves, possibly due to LVH Confirmed by Godfrey Pick ([id number]) on 02/07/2021 11:24:37 PM  I have reviewed the labs performed to date as well as medications administered while in observation.  Recent changes in the last 24 hours include no acute events.  Plan  Current plan is for patient had come in with SI and is awaiting inpatient psych placement. Debra Jackson is under involuntary commitment.      Varney Biles, MD 02/08/21 [phone]

## 2021-02-08 NOTE — Progress Notes (Signed)
Per Clovis Riley, patient meets criteria for inpatient treatment. There are no available or appropriate beds at [facility] today. CSW faxed referrals to the following facilities for review:  [facility]  Pending - No Request Sent N/A [address] [place] [address] [phone] --  [facility] [address] [place] [address] [phone] --  [facility]  Pending - No Request Sent N/A [place], [place] [address] [phone] --  [facility]  Pending - No Request Sent N/A [address] [place], [place] [address] [phone] --  [facility]  Pending - No Request Sent N/A [place] [place] [address] [phone] --  [facility]  Pending - No Request Sent N/A [address], [place] [address] [phone] --    TTS will continue to seek bed placement.  Glennie Isle, MSW, [place], LCAS-A Phone: [phone] Disposition/TOC

## 2021-02-08 NOTE — ED Notes (Signed)
Informed provider of critical Mg+ 7.2; redrew sample and have sent to lab to process again for confirmation to r/o contaminated sample

## 2021-02-08 NOTE — ED Notes (Signed)
F/U call from Poison Control to discuss repeat EKG. Based on prior EKG obtained in Feb 2022 her EKG abnormality is consistent with baseline and PC is closing her case at this time with no new recommendations for monitoring or care. MD notified.

## 2021-02-08 NOTE — ED Notes (Signed)
Pt has visitor at bedside. Pt aware that visits are only for 30 mins with an hour between each 7mins.

## 2021-02-09 DIAGNOSIS — F339 Major depressive disorder, recurrent, unspecified: Secondary | ICD-10-CM

## 2021-02-09 DIAGNOSIS — F332 Major depressive disorder, recurrent severe without psychotic features: Secondary | ICD-10-CM

## 2021-02-09 DIAGNOSIS — T1491XA Suicide attempt, initial encounter: Secondary | ICD-10-CM | POA: Insufficient documentation

## 2021-02-09 LAB — RAPID URINE DRUG SCREEN, HOSP PERFORMED
Amphetamines: NOT DETECTED
Barbiturates: NOT DETECTED
Benzodiazepines: POSITIVE — AB
Cocaine: NOT DETECTED
Opiates: NOT DETECTED
Tetrahydrocannabinol: NOT DETECTED

## 2021-02-09 LAB — URINALYSIS, ROUTINE W REFLEX MICROSCOPIC
Bilirubin Urine: NEGATIVE
Glucose, UA: NEGATIVE mg/dL
Ketones, ur: 20 mg/dL — AB
Nitrite: NEGATIVE
Protein, ur: 30 mg/dL — AB
Specific Gravity, Urine: 1.017 (ref 1.005–1.030)
pH: 6 (ref 5.0–8.0)

## 2021-02-09 NOTE — BH Assessment (Signed)
[facility] Assessment Progress Note   Per Elmarie Shiley, NP this pt requires psychiatric hospitalization, preferably at a facility providing specialty care for geriatric patients, at this time.  Pt presents under IVC initiated by EDP Godfrey Pick, MD.  Pt has been submitted to Alethia Berthold, MD for consideration for admission to [facility] is pending as of this writing.  Jalene Mullet, [place] Coordinator [phone]
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
## 2021-02-10 ENCOUNTER — Encounter: Payer: Self-pay | Admitting: Psychiatry

## 2021-02-10 ENCOUNTER — Other Ambulatory Visit: Payer: Self-pay

## 2021-02-10 ENCOUNTER — Inpatient Hospital Stay
Admission: AD | Admit: 2021-02-10 | Discharge: 2021-02-11 | DRG: 885 | Disposition: A | Discharge status: Home / Self Care | Payer: Medicare Other | Source: Intra-hospital | Attending: Psychiatry | Admitting: Psychiatry

## 2021-02-10 DIAGNOSIS — Z79899 Other long term (current) drug therapy: Secondary | ICD-10-CM | POA: Diagnosis not present

## 2021-02-10 DIAGNOSIS — F419 Anxiety disorder, unspecified: Secondary | ICD-10-CM | POA: Diagnosis present

## 2021-02-10 DIAGNOSIS — Z87891 Personal history of nicotine dependence: Secondary | ICD-10-CM | POA: Diagnosis not present

## 2021-02-10 DIAGNOSIS — T424X2A Poisoning by benzodiazepines, intentional self-harm, initial encounter: Secondary | ICD-10-CM | POA: Diagnosis not present

## 2021-02-10 DIAGNOSIS — F332 Major depressive disorder, recurrent severe without psychotic features: Principal | ICD-10-CM | POA: Diagnosis present

## 2021-02-10 DIAGNOSIS — Z20822 Contact with and (suspected) exposure to covid-19: Secondary | ICD-10-CM | POA: Diagnosis present

## 2021-02-10 DIAGNOSIS — F909 Attention-deficit hyperactivity disorder, unspecified type: Secondary | ICD-10-CM | POA: Diagnosis present

## 2021-02-10 DIAGNOSIS — K219 Gastro-esophageal reflux disease without esophagitis: Secondary | ICD-10-CM | POA: Diagnosis present

## 2021-02-10 LAB — RESP PANEL BY RT-PCR (FLU A&B, COVID) ARPGX2
Influenza A by PCR: NEGATIVE
Influenza B by PCR: NEGATIVE
SARS Coronavirus 2 by RT PCR: NEGATIVE

## 2021-02-10 MED ORDER — LISDEXAMFETAMINE DIMESYLATE 30 MG PO CAPS
30.0000 mg | ORAL_CAPSULE | ORAL | Status: DC
  Administered 2021-02-11: 30 mg via ORAL
  Filled 2021-02-10: qty 1

## 2021-02-10 MED ORDER — LORAZEPAM 2 MG/ML IJ SOLN
0.0000 mg | Freq: Two times a day (BID) | INTRAMUSCULAR | Status: DC
  Filled 2021-02-10: qty 2

## 2021-02-10 MED ORDER — ALUM & MAG HYDROXIDE-SIMETH 200-200-20 MG/5ML PO SUSP: 30.0000 mL | ORAL | Status: DC | PRN

## 2021-02-10 MED ORDER — CITALOPRAM HYDROBROMIDE 20 MG PO TABS
20.0000 mg | ORAL_TABLET | Freq: Every day | ORAL | Status: DC
  Administered 2021-02-10 – 2021-02-11 (×2): 20 mg via ORAL
  Filled 2021-02-10 (×2): qty 1

## 2021-02-10 MED ORDER — THIAMINE HCL 100 MG PO TABS
100.0000 mg | ORAL_TABLET | Freq: Every day | ORAL | Status: DC
Start: 2021-02-11 — End: 2021-02-10

## 2021-02-10 MED ORDER — MAGNESIUM HYDROXIDE 400 MG/5ML PO SUSP: 30.0000 mL | Freq: Every day | ORAL | Status: DC | PRN

## 2021-02-10 MED ORDER — LORAZEPAM 2 MG PO TABS
0.0000 mg | ORAL_TABLET | Freq: Two times a day (BID) | ORAL | Status: DC
  Filled 2021-02-10: qty 2

## 2021-02-10 MED ORDER — THIAMINE HCL 100 MG/ML IJ SOLN: 100.0000 mg | Freq: Every day | INTRAMUSCULAR | Status: DC

## 2021-02-10 MED ORDER — IBUPROFEN 600 MG PO TABS
600.0000 mg | ORAL_TABLET | Freq: Three times a day (TID) | ORAL | Status: DC | PRN
  Filled 2021-02-10: qty 1

## 2021-02-10 MED ORDER — ACETAMINOPHEN 325 MG PO TABS: 650.0000 mg | ORAL_TABLET | Freq: Four times a day (QID) | ORAL | Status: DC | PRN

## 2021-02-10 NOTE — BH Assessment (Signed)
[place] Assessment Progress Note   Per Sheran Fava, NP, this pt requires psychiatric hospitalization.  Alethia Berthold, MD reports that he has accepted to [facility].  Pt presents under IVC initiated by EDP Godfrey Pick, MD, and IVC documents have been faxed to [phone].  [place], DO and pt's nurses, Candace and Gaynell Face, have been notified, and they agree to call report to [phone].  Pt is to be transported via [facility].   Jalene Mullet, [place] Coordinator [phone]

## 2021-02-10 NOTE — BHH Suicide Risk Assessment (Signed)
[facility] Admission Suicide Risk Assessment   Nursing information obtained from:  Patient Demographic factors:  Age 65 or older Current Mental Status:  NA Loss Factors:  NA Historical Factors:  Prior suicide attempts Risk Reduction Factors:  Living with another person, especially a relative  Total Time spent with patient: 1 hour Principal Problem: MDD (major depressive disorder), recurrent episode, severe ([place]) Diagnosis:  Principal Problem:   MDD (major depressive disorder), recurrent episode, severe ([place]) Active Problems:   ADHD  Subjective Data: Patient seen and chart reviewed.  65 year old woman with a history of depression took an overdose of clonazepam in front of her husband.  Went to the hospital.  Medically stabilized.  Waited a few days before they found a bed.  Patient states she is frustrated with her husband and the 2 of them squabble a lot.  He is away from home a lot working and when he comes home she feels like he does not pay enough attention to her.  Patient also admits to regular alcohol use and was intoxicated at the time that this happened.  She took the pills right in front of the him and says she never had any intention of dying.  Patient denies having any suicidal thoughts at all.  Chronic anxiety and dysphoria with mood up and down.  No psychotic symptoms reported.  No homicidal ideation.  Currently patient is appropriate and forthcoming.  Denies suicidal thoughts.  She has a history of ADHD and is normally on Celexa of Vyvanse and as needed clonazepam.  Continued Clinical Symptoms:  Alcohol Use Disorder Identification Test Final Score (AUDIT): 5 The "Alcohol Use Disorders Identification Test", Guidelines for Use in Primary Care, Second Edition.  World Pharmacologist [facility]). Score between 0-7:  no or low risk or alcohol related problems. Score between 8-15:  moderate risk of alcohol related problems. Score between 16-19:  high risk of alcohol related problems. Score 20  or above:  warrants further diagnostic evaluation for alcohol dependence and treatment.   CLINICAL FACTORS:   Depression:   Impulsivity   Musculoskeletal: Strength & Muscle Tone: within normal limits Gait & Station: normal Patient leans: N/A  Psychiatric Specialty Exam:  Presentation  General Appearance:  No data recorded Eye Contact: No data recorded Speech: No data recorded Speech Volume: No data recorded Handedness: No data recorded  Mood and Affect  Mood: No data recorded Affect: No data recorded  Thought Process  Thought Processes: No data recorded Descriptions of Associations:No data recorded Orientation:No data recorded Thought Content:No data recorded History of Schizophrenia/Schizoaffective disorder:No data recorded Duration of Psychotic Symptoms:No data recorded Hallucinations:No data recorded Ideas of Reference:No data recorded Suicidal Thoughts:No data recorded Homicidal Thoughts:No data recorded  Sensorium  Memory: No data recorded Judgment: No data recorded Insight: No data recorded  Executive Functions  Concentration: No data recorded Attention Span: No data recorded Recall: No data recorded Fund of Knowledge: No data recorded Language: No data recorded  Psychomotor Activity  Psychomotor Activity: No data recorded  Assets  Assets: No data recorded  Sleep  Sleep: No data recorded   Physical Exam: Physical Exam Vitals and nursing note reviewed.  Constitutional:      Appearance: Normal appearance.  HENT:     Head: Normocephalic and atraumatic.     Mouth/Throat:     Pharynx: Oropharynx is clear.  Eyes:     Pupils: Pupils are equal, round, and reactive to light.  Cardiovascular:     Rate and Rhythm: Normal rate and regular  rhythm.  Pulmonary:     Effort: Pulmonary effort is normal.     Breath sounds: Normal breath sounds.  Abdominal:     General: Abdomen is flat.     Palpations: Abdomen is soft.   Musculoskeletal:        General: Normal range of motion.  Skin:    General: Skin is warm and dry.  Neurological:     General: No focal deficit present.     Mental Status: She is alert. Mental status is at baseline.  Psychiatric:        Mood and Affect: Mood normal.        Thought Content: Thought content normal.   Review of Systems  Constitutional: Negative.   HENT: Negative.    Eyes: Negative.   Respiratory: Negative.    Cardiovascular: Negative.   Gastrointestinal: Negative.   Musculoskeletal: Negative.   Skin: Negative.   Neurological: Negative.   Psychiatric/Behavioral: Negative.    Blood pressure (!) 150/78, pulse 72, temperature 98.4 F (36.9 C), temperature source Oral, resp. rate 19, height 5\' 6"  (1.676 m), weight 90.7 kg, SpO2 98 %. Body mass index is 32.27 kg/m.   COGNITIVE FEATURES THAT CONTRIBUTE TO RISK:  None    SUICIDE RISK:   Mild:  Suicidal ideation of limited frequency, intensity, duration, and specificity.  There are no identifiable plans, no associated intent, mild dysphoria and related symptoms, good self-control (both objective and subjective assessment), few other risk factors, and identifiable protective factors, including available and accessible social support.  PLAN OF CARE: Patient has some impulsivity issues but currently feels that her mood is stable and completely denies suicidal thought.  No sign of hopelessness.  Very future focused.  Restart outpatient medication.  Discussed case with treatment team.  Possible discharge by tomorrow.  Reassess suicidal ideation.  I certify that inpatient services furnished can reasonably be expected to improve the patient's condition.   Alethia Berthold, MD 02/10/2021, 5:50 PM

## 2021-02-10 NOTE — ED Notes (Signed)
Sheriff's office here to take pt.

## 2021-02-10 NOTE — ED Provider Notes (Signed)
Emergency Medicine Observation Re-evaluation Note  Debra Jackson is a 65 y.o. female, seen on rounds today.  Pt initially presented to the ED for complaints of Suicide Attempt Currently, the patient is resting comfortably.  Physical Exam  BP 126/77   Pulse 70   Temp (!) 97.4 F (36.3 C)   Resp 15   Ht 5\' 6"  (1.676 m)   Wt 90.7 kg   SpO2 96%   BMI 32.28 kg/m  Physical Exam General: No acute distress Cardiac: Regular rhythm Lungs: No respiratory distress Psych: Calm  ED Course / MDM  EKG:EKG Interpretation  Date/Time:  Saturday February 07 2021 17:19:42 EDT Ventricular Rate:  66 PR Interval:  187 QRS Duration: 117 QT Interval:  476 QTC Calculation: 499 R Axis:   -36 Text Interpretation: Sinus rhythm Nonspecific IVCD with LAD Low voltage, precordial leads LVH with secondary repolarization abnormality Anterior Q waves, possibly due to LVH Confirmed by Godfrey Pick ([id number]) on 02/07/2021 11:24:37 PM  I have reviewed the labs performed to date as well as medications administered while in observation.  Recent changes in the last 24 hours include no acute events.  Plan  Current plan is for patient had come in with SI and is awaiting inpatient psych placement. Debra Jackson is under involuntary commitment.         Deno Etienne, DO 02/10/21 [phone]

## 2021-02-10 NOTE — Tx Team (Signed)
Initial Treatment Plan 02/10/2021 4:36 PM Debra Jackson VHA:[id number]   PATIENT STRESSORS: Substance abuse     PATIENT STRENGTHS: Capable of independent living  Communication skills  General fund of knowledge    PATIENT IDENTIFIED PROBLEMS: Loneliness                     DISCHARGE CRITERIA:  Ability to meet basic life and health needs Improved stabilization in mood, thinking, and/or behavior  PRELIMINARY DISCHARGE PLAN: Return to previous living arrangement  PATIENT/FAMILY INVOLVEMENT: This treatment plan has been presented to and reviewed with the patient, [name].  The patient was given the opportunity to ask questions and make suggestions.  Sunday Spillers, RN 02/10/2021, 4:36 PM

## 2021-02-10 NOTE — ED Notes (Signed)
[facility] paged for transport. This nurse spoke with Arman Bogus.
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
## 2021-02-10 NOTE — ED Notes (Signed)
Pt given breakfast tray. Pt sitting upright on ED stretcher. Calm and cooperative at this time. Sitter at the bedside. 1:1.

## 2021-02-10 NOTE — Progress Notes (Signed)
Follow-Up with placement at [facility]  Per Alethia Berthold, MD that MD will defer to nursing about tonight. Patient is day one and MD reports that he would prefer waiting to re-evaluate tomorrow. CSW will continue to assist and follow for disposition for potential bed offers.   Benjaman Kindler, MSW, LCSWA 02/10/2021 12:23 AM

## 2021-02-10 NOTE — ED Notes (Signed)
Pt within direct line of sight of nurses station. Pt resting quietly with eyes closed. Normal chest rise.
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
## 2021-02-10 NOTE — Progress Notes (Signed)
Patient is calm and cooperative. Patient stated she would not hurt herself she just wanted to see if her husband still cared about her. Pt denies SI, HI, and AVH at this time. Pt voiced no complaints to this Probation officer. Pt remains safe on the unit at this time.

## 2021-02-10 NOTE — Consult Note (Signed)
  Patient continues to meet inpatient criteria after she intentional overdose on 10 Klonopin tablets in an attempt to end her life. Patient also noted to have superficial lacerations to bilateral forearms.   -Continue working closely with CSW for inpatient psychiatric admission and referrals.  -Continue Ativan detox protocol, BAL 176 on admission -Consider treating leukocytes found in urine, as this may be contributing to her change in mental state.

## 2021-02-10 NOTE — ED Notes (Signed)
Pt accepted at [facility] Unit Accepting physician: Dr. Christoper Fabian: [phone] Report called to Brooke Pace

## 2021-02-11 LAB — LIPID PANEL
Cholesterol: 241 mg/dL — ABNORMAL HIGH (ref 0–200)
HDL: 52 mg/dL (ref >40)
LDL Cholesterol: 172 mg/dL — ABNORMAL HIGH (ref 0–99)
Total CHOL/HDL Ratio: 4.6 RATIO
Triglycerides: 83 mg/dL (ref <150)
VLDL: 17 mg/dL (ref 0–40)

## 2021-02-11 LAB — HEMOGLOBIN A1C
Hgb A1c MFr Bld: 5 % (ref 4.8–5.6)
Mean Plasma Glucose: 97 mg/dL

## 2021-02-11 LAB — TSH: TSH: 4.335 u[IU]/mL (ref 0.350–4.500)

## 2021-02-11 NOTE — Plan of Care (Signed)
Patient alert and oriented x 4.  Affect is pleasant and calm. Denies anxiety, SI/HI or AVH.  Patient states they will try to keep themselves safe when they return home.  Reviewed discharge instructions with patient including follow up appointment with provider, medication and prescriptions.  Questions answered and understanding verbalized.  Discharge packet given.  All belongings returned to patient after verification completed by staff.    Patient escorted by staff off unit at this time stable without complaint.  Problem: Education: Goal: Knowledge of [facility] Education information/materials will improve Outcome: Adequate for Discharge Goal: Emotional status will improve Outcome: Adequate for Discharge Goal: Mental status will improve Outcome: Adequate for Discharge Goal: Verbalization of understanding the information provided will improve Outcome: Adequate for Discharge   Problem: Activity: Goal: Interest or engagement in activities will improve Outcome: Adequate for Discharge Goal: Sleeping patterns will improve Outcome: Adequate for Discharge   Problem: Coping: Goal: Ability to verbalize frustrations and anger appropriately will improve Outcome: Adequate for Discharge Goal: Ability to demonstrate self-control will improve Outcome: Adequate for Discharge   Problem: Health Behavior/Discharge Planning: Goal: Identification of resources available to assist in meeting health care needs will improve Outcome: Adequate for Discharge Goal: Compliance with treatment plan for underlying cause of condition will improve Outcome: Adequate for Discharge   Problem: Physical Regulation: Goal: Ability to maintain clinical measurements within normal limits will improve Outcome: Adequate for Discharge   Problem: Safety: Goal: Periods of time without injury will increase Outcome: Adequate for Discharge   Problem: Education: Goal: Utilization of techniques to improve thought  processes will improve Outcome: Adequate for Discharge Goal: Knowledge of the prescribed therapeutic regimen will improve Outcome: Adequate for Discharge   Problem: Coping: Goal: Coping ability will improve Outcome: Adequate for Discharge Goal: Will verbalize feelings Outcome: Adequate for Discharge   Problem: Safety: Goal: Ability to disclose and discuss suicidal ideas will improve Outcome: Adequate for Discharge Goal: Ability to identify and utilize support systems that promote safety will improve Outcome: Adequate for Discharge

## 2021-02-11 NOTE — Progress Notes (Signed)
  [facility] Case Management Discharge Plan :  Will you be returning to the same living situation after discharge:  Yes,  pt's home At discharge, do you have transportation home?: Yes,  pt's husband providing transportation Do you have the ability to pay for your medications: Yes,  pt has [facility] Medicare  Release of information consent forms completed and in the chart;  Patient's signature needed at discharge.  Patient to Follow up at:  Follow-up [place]. Call.   Specialty: Behavioral Health Why: Patient has an appointment schduled for November 7th. Patient will call to reschedule appointment due to other pending medical procedures. Contact information: [place] [address] [place] [address] [phone]                 Next level of care provider has access to [place] and Suicide Prevention discussed: Yes,  completed with pt     Has patient been referred to the Quitline?: Patient refused referral  Patient has been referred for addiction treatment: Pt. refused referral  Ziyon Soltau A [place], [place] 02/11/2021, 10:19 AM

## 2021-02-11 NOTE — Discharge Summary (Signed)
Physician Discharge Summary Note  Patient:  Debra Jackson is an 65 y.o., female MRN:  [id number] DOB:  Oct 13, 1955 Patient phone:  [phone] (home)  Patient address:   [name] [address],  Total Time spent with patient: 45 minutes  Date of Admission:  02/10/2021 Date of Discharge: 02/11/2021  Reason for Admission: Patient was admitted transferred from [facility] for further psychiatric treatment after an overdose intentionally on clonazepam  Principal Problem: MDD (major depressive disorder), recurrent episode, severe ([place]) Discharge Diagnoses: Principal Problem:   MDD (major depressive disorder), recurrent episode, severe ([place]) Active Problems:   ADHD   Past Psychiatric History: Patient has a past history of treatment for ADHD and depression and anxiety symptoms.  Past Medical History:  Past Medical History:  Diagnosis Date   Anxiety    Depression    GERD (gastroesophageal reflux disease)    Opiate abuse, continuous ([place])    Polysubstance abuse ([place])     Past Surgical History:  Procedure Laterality Date   TONSILLECTOMY     Family History:  Family History  Adopted: Yes   Family Psychiatric  History: See previous.  None reported Social History:  Social History   Substance and Sexual Activity  Alcohol Use Yes   Alcohol/week: 2.0 - 4.0 standard drinks   Types: 2 - 4 Glasses of wine per week   Comment: daily     Social History   Substance and Sexual Activity  Drug Use Not Currently   Types: Cocaine   Comment: heroin    Social History   Socioeconomic History   Marital status: Single    Spouse name: Not on file   Number of children: Not on file   Years of education: Not on file   Highest education level: Not on file  Occupational History   Not on file  Tobacco Use   Smoking status: Former    Types: Cigarettes   Smokeless tobacco: Never  Vaping Use   Vaping Use: Never used  Substance and Sexual Activity   Alcohol use: Yes     Alcohol/week: 2.0 - 4.0 standard drinks    Types: 2 - 4 Glasses of wine per week    Comment: daily   Drug use: Not Currently    Types: Cocaine    Comment: heroin   Sexual activity: Not on file  Other Topics Concern   Not on file  Social History Narrative   Not on file   Social Determinants of Health   Financial Resource Strain: Not on file  Food Insecurity: Not on file  Transportation Needs: Not on file  Physical Activity: Not on file  Stress: Not on file  Social Connections: Not on file    Hospital Course: Patient admitted to the geriatric unit.  15-minute checks maintained.  Treatment team met with the patient today.  I had met with the patient yesterday as had individual members of the treatment team.  Patient did not display any dangerous behavior while she was in the hospital and consistently denied any suicidal thought.  She denied any homicidal ideation and did not display any psychotic symptoms.  Showed good insight and expressed a willingness to continue engagement in appropriate outpatient treatment.  No indication found for immediate change to prescription medication.  Supportive counseling around suicide prevention completed.  At this point patient appears to be returned to her baseline and no longer in need of inpatient care.  Physical Findings: AIMS:  , ,  ,  ,  CIWA:    COWS:     Musculoskeletal: Strength & Muscle Tone: within normal limits Gait & Station: normal Patient leans: N/A   Psychiatric Specialty Exam:  Presentation  General Appearance:  No data recorded Eye Contact: No data recorded Speech: No data recorded Speech Volume: No data recorded Handedness: No data recorded  Mood and Affect  Mood: No data recorded Affect: No data recorded  Thought Process  Thought Processes: No data recorded Descriptions of Associations:No data recorded Orientation:No data recorded Thought Content:No data recorded History of  Schizophrenia/Schizoaffective disorder:No data recorded Duration of Psychotic Symptoms:No data recorded Hallucinations:No data recorded Ideas of Reference:No data recorded Suicidal Thoughts:No data recorded Homicidal Thoughts:No data recorded  Sensorium  Memory: No data recorded Judgment: No data recorded Insight: No data recorded  Executive Functions  Concentration: No data recorded Attention Span: No data recorded Recall: No data recorded Fund of Knowledge: No data recorded Language: No data recorded  Psychomotor Activity  Psychomotor Activity: No data recorded  Assets  Assets: No data recorded  Sleep  Sleep: No data recorded   Physical Exam: Physical Exam Vitals and nursing note reviewed.  Constitutional:      Appearance: Normal appearance.  HENT:     Head: Normocephalic and atraumatic.     Mouth/Throat:     Pharynx: Oropharynx is clear.  Eyes:     Pupils: Pupils are equal, round, and reactive to light.  Cardiovascular:     Rate and Rhythm: Normal rate and regular rhythm.  Pulmonary:     Effort: Pulmonary effort is normal.     Breath sounds: Normal breath sounds.  Abdominal:     General: Abdomen is flat.     Palpations: Abdomen is soft.  Musculoskeletal:        General: Normal range of motion.  Skin:    General: Skin is warm and dry.  Neurological:     General: No focal deficit present.     Mental Status: She is alert. Mental status is at baseline.  Psychiatric:        Attention and Perception: Attention normal.        Mood and Affect: Mood is anxious.        Speech: Speech is tangential.        Behavior: Behavior is cooperative.        Thought Content: Thought content normal.        Cognition and Memory: Cognition normal.        Judgment: Judgment normal.   Review of Systems  Constitutional: Negative.   HENT: Negative.    Eyes: Negative.   Respiratory: Negative.    Cardiovascular: Negative.   Gastrointestinal: Negative.    Musculoskeletal: Negative.   Skin: Negative.   Neurological: Negative.   Psychiatric/Behavioral: Negative.    Blood pressure 124/73, pulse 78, temperature 97.9 F (36.6 C), temperature source Oral, resp. rate 18, height '5\' 6"'  (1.676 m), weight 90.7 kg, SpO2 99 %. Body mass index is 32.27 kg/m.   Social History   Tobacco Use  Smoking Status Former   Types: Cigarettes  Smokeless Tobacco Never   Tobacco Cessation:  N/A, patient does not currently use tobacco products   Blood Alcohol level:  Lab Results  Component Value Date   ETH 176 (H) 02/07/2021   ETH <10 21/19/4174    Metabolic Disorder Labs:  No results found for: HGBA1C, MPG No results found for: PROLACTIN No results found for: CHOL, TRIG, HDL, CHOLHDL, VLDL, [place]  See Psychiatric Specialty Exam  and Suicide Risk Assessment completed by Attending Physician prior to discharge.  Discharge destination:  Home  Is patient on multiple antipsychotic therapies at discharge:  No   Has Patient had three or more failed trials of antipsychotic monotherapy by history:  No  Recommended Plan for Multiple Antipsychotic Therapies: NA     Follow-up [facility]. Call.   Specialty: Behavioral Health Why: Patient has an appointment schduled for November 7th. Patient will call to reschedule appointment due to other pending medical procedures. Contact information: [address] [place] [address] [phone]                 Follow-up recommendations: Patient is to engage with medication management and psychotherapy for further treatment of depression and impulsivity.  She is educated and given recommendations to cut down or discontinue alcohol use particularly in combination with clonazepam and during periods of emotional stress.  No new prescriptions need be provided.  She can be discharged safely with outpatient follow-up to her home.  Comments: See note  above.  No new prescriptions required.  Signed: Alethia Berthold, MD 02/11/2021, 10:14 AM

## 2021-02-11 NOTE — BHH Suicide Risk Assessment (Signed)
[facility] INPATIENT:  Family/Significant Other Suicide Prevention Education  Suicide Prevention Education: SPE completed with pt, as pt refused to consent to family contact. SPI pamphlet provided to pt and pt was encouraged to share information with support network, ask questions, and talk about any concerns relating to SPE. Pt denies access to guns/firearms and verbalized understanding of information provided. Mobile Crisis information also provided to pt.  Patient Refusal for Family/Significant Other Suicide Prevention Education: The patient Debra Jackson has refused to provide written consent for family/significant other to be provided Family/Significant Other Suicide Prevention Education during admission and/or prior to discharge.  Physician notified.  Takeira Yanes A [place] 02/11/2021, 10:21 AM

## 2021-02-11 NOTE — BHH Suicide Risk Assessment (Signed)
[facility] Discharge Suicide Risk Assessment   Principal Problem: MDD (major depressive disorder), recurrent episode, severe ([place]) Discharge Diagnoses: Principal Problem:   MDD (major depressive disorder), recurrent episode, severe ([place]) Active Problems:   ADHD   Total Time spent with patient: 45 minutes  Musculoskeletal: Strength & Muscle Tone: within normal limits Gait & Station: normal Patient leans: N/A  Psychiatric Specialty Exam  Presentation  General Appearance:  No data recorded Eye Contact: No data recorded Speech: No data recorded Speech Volume: No data recorded Handedness: No data recorded  Mood and Affect  Mood: No data recorded Duration of Depression Symptoms: Greater than two weeks  Affect: No data recorded  Thought Process  Thought Processes: No data recorded Descriptions of Associations:No data recorded Orientation:No data recorded Thought Content:No data recorded History of Schizophrenia/Schizoaffective disorder:No data recorded Duration of Psychotic Symptoms:No data recorded Hallucinations:No data recorded Ideas of Reference:No data recorded Suicidal Thoughts:No data recorded Homicidal Thoughts:No data recorded  Sensorium  Memory: No data recorded Judgment: No data recorded Insight: No data recorded  Executive Functions  Concentration: No data recorded Attention Span: No data recorded Recall: No data recorded Fund of Knowledge: No data recorded Language: No data recorded  Psychomotor Activity  Psychomotor Activity: No data recorded  Assets  Assets: No data recorded  Sleep  Sleep: No data recorded  Physical Exam: Physical Exam Vitals and nursing note reviewed.  Constitutional:      Appearance: Normal appearance.  HENT:     Head: Normocephalic and atraumatic.     Mouth/Throat:     Pharynx: Oropharynx is clear.  Eyes:     Pupils: Pupils are equal, round, and reactive to light.  Cardiovascular:     Rate and Rhythm:  Normal rate and regular rhythm.  Pulmonary:     Effort: Pulmonary effort is normal.     Breath sounds: Normal breath sounds.  Abdominal:     General: Abdomen is flat.     Palpations: Abdomen is soft.  Musculoskeletal:        General: Normal range of motion.  Skin:    General: Skin is warm and dry.  Neurological:     General: No focal deficit present.     Mental Status: She is alert. Mental status is at baseline.  Psychiatric:        Mood and Affect: Mood normal.        Thought Content: Thought content normal.   Review of Systems  Constitutional: Negative.   HENT: Negative.    Eyes: Negative.   Respiratory: Negative.    Cardiovascular: Negative.   Gastrointestinal: Negative.   Musculoskeletal: Negative.   Skin: Negative.   Neurological: Negative.   Psychiatric/Behavioral: Negative.    Blood pressure 124/73, pulse 78, temperature 97.9 F (36.6 C), temperature source Oral, resp. rate 18, height '5\' 6"'  (1.676 m), weight 90.7 kg, SpO2 99 %. Body mass index is 32.27 kg/m.  Mental Status Per Nursing Assessment::   On Admission:  NA  Demographic Factors:  Age 65 or older and Caucasian  Loss Factors: Decline in physical health  Historical Factors: Impulsivity  Risk Reduction Factors:   Sense of responsibility to family, Living with another person, especially a relative, Positive social support, and Positive therapeutic relationship  Continued Clinical Symptoms:  Depression:   Comorbid alcohol abuse/dependence  Cognitive Features That Contribute To Risk:  None    Suicide Risk:  Minimal: No identifiable suicidal ideation.  Patients presenting with no risk factors but with morbid ruminations; may be classified  as minimal risk based on the severity of the depressive symptoms   [facility]. Call.   Specialty: Behavioral Health Why: Patient has an appointment schduled for November 7th. Patient will call to reschedule  appointment due to other pending medical procedures. Contact information: [place] [address] [phone]                 Plan Of Care/Follow-up recommendations:  Patient is not displaying any dangerous behavior in the hospital.  She is consistently showing euthymic affect with some anxiety.  Denies any suicidal thought at all.  States multiple positive plans for the future.  Agrees to recommended plan to continue psychiatric treatment and therapy outside the hospital.  Does not meet commitment criteria.  Patient no longer requires inpatient treatment.  She has met with the treatment team and is agreeable to discharge plan  Alethia Berthold, MD 02/11/2021, 10:22 AM

## 2021-02-11 NOTE — BHH Counselor (Signed)
Adult Comprehensive Assessment  Patient ID: Debra Jackson, female   DOB: Jan 05, 1956, 65 y.o.   MRN: [id number]  Information Source:    Current Stressors:     Living/Environment/Situation:  Living Arrangements: Spouse/significant other  Family History:     Childhood History:     Education:     Employment/Work Situation:      Museum/gallery curator Resources:      Alcohol/Substance Abuse:      Social Support System:      Leisure/Recreation:      Strengths/Needs:      Discharge Plan:      Summary/Recommendations:   Architectural technologist and Recommendations (to be completed by the evaluator): Patient is a 65 year old female, married, from [place], [place] [facility] She reports that she is retired and receives [employer]. She presents to the hospital following suicidal ideation and plan via ingesting pills. Recent stressors include marital issues and acute depressive symptoms. Patient has a diagnosis of MDD (major depressive disorder), recurrent episode, severe.  Patient receives community mental health and psyciatric care from [facility]. Recommendations include: crisis stabilization, therapeutic milieu, encourage group attendance and participation, medication management for mood stabilization and development of comprehensive mental wellness plan.  Tarrin Menn A [place]. 02/11/2021

## 2021-02-11 NOTE — BH IP Treatment Plan (Signed)
Interdisciplinary Treatment and Diagnostic Plan Update  02/11/2021 Time of Session: 9:30AM Debra Jackson MRN: [id number]  Principal Diagnosis: MDD (major depressive disorder), recurrent episode, severe ([place])  Secondary Diagnoses: Principal Problem:   MDD (major depressive disorder), recurrent episode, severe ([place]) Active Problems:   ADHD   Current Medications:  Current Facility-Administered Medications  Medication Dose Route Frequency Provider Last Rate Last Admin   acetaminophen (TYLENOL) tablet 650 mg  650 mg Oral Q6H PRN Starkes-Perry, Gayland Curry, FNP       alum & mag hydroxide-simeth (MAALOX/MYLANTA) 200-200-20 MG/5ML suspension 30 mL  30 mL Oral Q4H PRN Starkes-Perry, Gayland Curry, FNP       citalopram (CELEXA) tablet 20 mg  20 mg Oral Daily Clapacs, John T, MD   20 mg at 02/11/21 0933   ibuprofen (ADVIL) tablet 600 mg  600 mg Oral Q8H PRN Suella Broad, FNP       lisdexamfetamine (VYVANSE) capsule 30 mg  30 mg Oral BH-q7a Clapacs, John T, MD   30 mg at 02/11/21 2836   magnesium hydroxide (MILK OF MAGNESIA) suspension 30 mL  30 mL Oral Daily PRN Suella Broad, FNP       PTA Medications: Medications Prior to Admission  Medication Sig Dispense Refill Last Dose   citalopram (CELEXA) 20 MG tablet Take 20 mg by mouth daily.      clonazePAM (KLONOPIN) 0.5 MG tablet Take 1 mg by mouth 2 (two) times daily as needed for anxiety.      ibuprofen (ADVIL) 400 MG tablet Take 400 mg by mouth every 6 (six) hours as needed for moderate pain, headache or cramping.      ondansetron (ZOFRAN) 4 MG tablet Take 1 tablet (4 mg total) by mouth every 6 (six) hours. (Patient not taking: No sig reported) 12 tablet 0    QUEtiapine (SEROQUEL) 50 MG tablet Take 50 mg by mouth at bedtime.      REXULTI 0.5 MG TABS Take 0.5 mg by mouth daily.      VYVANSE 30 MG capsule Take 30 mg by mouth every morning.       Patient Stressors: Substance abuse    Patient Strengths: Capable of independent living   Marketing executive fund of knowledge   Treatment Modalities: Medication Management, Group therapy, Case management,  1 to 1 session with clinician, Psychoeducation, Recreational therapy.   Physician Treatment Plan for Primary Diagnosis: MDD (major depressive disorder), recurrent episode, severe ([place]) Long Term Goal(s): Improvement in symptoms so as ready for discharge   Short Term Goals: Ability to identify triggers associated with substance abuse/mental health issues will improve Ability to verbalize feelings will improve Ability to demonstrate self-control will improve  Medication Management: Evaluate patient's response, side effects, and tolerance of medication regimen.  Therapeutic Interventions: 1 to 1 sessions, Unit Group sessions and Medication administration.  Evaluation of Outcomes: Adequate for Discharge  Physician Treatment Plan for Secondary Diagnosis: Principal Problem:   MDD (major depressive disorder), recurrent episode, severe ([place]) Active Problems:   ADHD  Long Term Goal(s): Improvement in symptoms so as ready for discharge   Short Term Goals: Ability to identify triggers associated with substance abuse/mental health issues will improve Ability to verbalize feelings will improve Ability to demonstrate self-control will improve     Medication Management: Evaluate patient's response, side effects, and tolerance of medication regimen.  Therapeutic Interventions: 1 to 1 sessions, Unit Group sessions and Medication administration.  Evaluation of Outcomes: Adequate for Discharge   RN  Treatment Plan for Primary Diagnosis: MDD (major depressive disorder), recurrent episode, severe ([place]) Long Term Goal(s): Knowledge of disease and therapeutic regimen to maintain health will improve  Short Term Goals: Ability to remain free from injury will improve, Ability to verbalize frustration and anger appropriately will improve, Ability to demonstrate self-control,  Ability to participate in decision making will improve, Ability to verbalize feelings will improve, Ability to disclose and discuss suicidal ideas, Ability to identify and develop effective coping behaviors will improve, and Compliance with prescribed medications will improve  Medication Management: RN will administer medications as ordered by provider, will assess and evaluate patient's response and provide education to patient for prescribed medication. RN will report any adverse and/or side effects to prescribing provider.  Therapeutic Interventions: 1 on 1 counseling sessions, Psychoeducation, Medication administration, Evaluate responses to treatment, Monitor vital signs and CBGs as ordered, Perform/monitor CIWA, COWS, AIMS and Fall Risk screenings as ordered, Perform wound care treatments as ordered.  Evaluation of Outcomes: Adequate for Discharge   LCSW Treatment Plan for Primary Diagnosis: MDD (major depressive disorder), recurrent episode, severe ([place]) Long Term Goal(s): Safe transition to appropriate next level of care at discharge, Engage patient in therapeutic group addressing interpersonal concerns.  Short Term Goals: Engage patient in aftercare planning with referrals and resources, Increase social support, Increase ability to appropriately verbalize feelings, Increase emotional regulation, Facilitate acceptance of mental health diagnosis and concerns, Identify triggers associated with mental health/substance abuse issues, and Increase skills for wellness and recovery  Therapeutic Interventions: Assess for all discharge needs, 1 to 1 time with Social worker, Explore available resources and support systems, Assess for adequacy in community support network, Educate family and significant other(s) on suicide prevention, Complete Psychosocial Assessment, Interpersonal group therapy.  Evaluation of Outcomes: Adequate for Discharge   Progress in Treatment: Attending groups:  No. Participating in groups: No. Taking medication as prescribed: Yes. Toleration medication: Yes. Family/Significant other contact made: No, will contact:  pt is discharging Patient understands diagnosis: Yes. Discussing patient identified problems/goals with staff: Yes. Medical problems stabilized or resolved: Yes. Denies suicidal/homicidal ideation: Yes. Issues/concerns per patient self-inventory: No. Other: None  New problem(s) identified: No, Describe:  none  New Short Term/Long Term Goal(s):  elimination of symptoms of psychosis, medication management for mood stabilization; elimination of SI thoughts; development of comprehensive mental wellness plan.   Patient Goals:  "Hope to get home and work on some things with my husband" "Need a family doctor."  Discharge Plan or Barriers: CSW will assist pt with appropriate discharge/aftercare plan.   Reason for Continuation of Hospitalization: Depression  Estimated Length of Stay: 1 - 7 days   Scribe for Treatment Team: Cliff Damiani A [place], Latanya Presser 02/11/2021 9:38 AM

## 2021-02-11 NOTE — Care Management Important Message (Addendum)
Important Message   Patient Details  Name: Debra Jackson MRN: [id number] Date of Birth: [birth year]   Medicare Important Message Given:  Yes    Reka Wist [place], MSW, LCSW-A 10/19/202210:42 AM

## 2021-03-13 ENCOUNTER — Inpatient Hospital Stay (HOSPITAL_COMMUNITY): Payer: Medicare Other

## 2021-03-13 ENCOUNTER — Emergency Department (HOSPITAL_COMMUNITY): Payer: Medicare Other

## 2021-03-13 ENCOUNTER — Inpatient Hospital Stay (HOSPITAL_COMMUNITY)
Admission: EM | Admit: 2021-03-13 | Discharge: 2021-03-18 | DRG: 438 | Disposition: A | Discharge status: Home / Self Care | Payer: Medicare Other | Attending: Internal Medicine | Admitting: Internal Medicine

## 2021-03-13 ENCOUNTER — Other Ambulatory Visit: Payer: Self-pay

## 2021-03-13 DIAGNOSIS — F32A Depression, unspecified: Secondary | ICD-10-CM | POA: Diagnosis present

## 2021-03-13 DIAGNOSIS — E878 Other disorders of electrolyte and fluid balance, not elsewhere classified: Secondary | ICD-10-CM | POA: Diagnosis present

## 2021-03-13 DIAGNOSIS — D696 Thrombocytopenia, unspecified: Secondary | ICD-10-CM | POA: Diagnosis not present

## 2021-03-13 DIAGNOSIS — K920 Hematemesis: Secondary | ICD-10-CM | POA: Diagnosis not present

## 2021-03-13 DIAGNOSIS — Z87891 Personal history of nicotine dependence: Secondary | ICD-10-CM | POA: Diagnosis not present

## 2021-03-13 DIAGNOSIS — K859 Acute pancreatitis without necrosis or infection, unspecified: Secondary | ICD-10-CM | POA: Diagnosis present

## 2021-03-13 DIAGNOSIS — E876 Hypokalemia: Secondary | ICD-10-CM | POA: Diagnosis not present

## 2021-03-13 DIAGNOSIS — K6389 Other specified diseases of intestine: Secondary | ICD-10-CM

## 2021-03-13 DIAGNOSIS — E860 Dehydration: Secondary | ICD-10-CM | POA: Diagnosis present

## 2021-03-13 DIAGNOSIS — K7010 Alcoholic hepatitis without ascites: Secondary | ICD-10-CM | POA: Diagnosis present

## 2021-03-13 DIAGNOSIS — F1320 Sedative, hypnotic or anxiolytic dependence, uncomplicated: Secondary | ICD-10-CM | POA: Diagnosis present

## 2021-03-13 DIAGNOSIS — K852 Alcohol induced acute pancreatitis without necrosis or infection: Secondary | ICD-10-CM | POA: Diagnosis present

## 2021-03-13 DIAGNOSIS — K226 Gastro-esophageal laceration-hemorrhage syndrome: Secondary | ICD-10-CM | POA: Diagnosis present

## 2021-03-13 DIAGNOSIS — D125 Benign neoplasm of sigmoid colon: Secondary | ICD-10-CM | POA: Diagnosis present

## 2021-03-13 DIAGNOSIS — Z79899 Other long term (current) drug therapy: Secondary | ICD-10-CM | POA: Diagnosis not present

## 2021-03-13 DIAGNOSIS — K449 Diaphragmatic hernia without obstruction or gangrene: Secondary | ICD-10-CM | POA: Diagnosis present

## 2021-03-13 DIAGNOSIS — K8020 Calculus of gallbladder without cholecystitis without obstruction: Secondary | ICD-10-CM | POA: Diagnosis present

## 2021-03-13 DIAGNOSIS — R197 Diarrhea, unspecified: Secondary | ICD-10-CM | POA: Diagnosis not present

## 2021-03-13 DIAGNOSIS — K2970 Gastritis, unspecified, without bleeding: Secondary | ICD-10-CM | POA: Diagnosis present

## 2021-03-13 DIAGNOSIS — Z20822 Contact with and (suspected) exposure to covid-19: Secondary | ICD-10-CM | POA: Diagnosis present

## 2021-03-13 DIAGNOSIS — Z87442 Personal history of urinary calculi: Secondary | ICD-10-CM

## 2021-03-13 DIAGNOSIS — R062 Wheezing: Secondary | ICD-10-CM | POA: Diagnosis not present

## 2021-03-13 DIAGNOSIS — K760 Fatty (change of) liver, not elsewhere classified: Secondary | ICD-10-CM | POA: Diagnosis present

## 2021-03-13 DIAGNOSIS — Z880 Allergy status to penicillin: Secondary | ICD-10-CM | POA: Diagnosis not present

## 2021-03-13 DIAGNOSIS — N179 Acute kidney failure, unspecified: Secondary | ICD-10-CM | POA: Diagnosis present

## 2021-03-13 DIAGNOSIS — F419 Anxiety disorder, unspecified: Secondary | ICD-10-CM | POA: Diagnosis present

## 2021-03-13 DIAGNOSIS — E872 Acidosis, unspecified: Secondary | ICD-10-CM | POA: Diagnosis present

## 2021-03-13 DIAGNOSIS — F10139 Alcohol abuse with withdrawal, unspecified: Secondary | ICD-10-CM | POA: Diagnosis present

## 2021-03-13 DIAGNOSIS — K8500 Idiopathic acute pancreatitis without necrosis or infection: Secondary | ICD-10-CM | POA: Diagnosis not present

## 2021-03-13 DIAGNOSIS — R251 Tremor, unspecified: Secondary | ICD-10-CM | POA: Diagnosis present

## 2021-03-13 DIAGNOSIS — Z9151 Personal history of suicidal behavior: Secondary | ICD-10-CM

## 2021-03-13 LAB — BASIC METABOLIC PANEL
Anion gap: 28 — ABNORMAL HIGH (ref 5–15)
Anion gap: 22 — ABNORMAL HIGH (ref 5–15)
BUN: 21 mg/dL (ref 8–23)
BUN: 20 mg/dL (ref 8–23)
BUN: 22 mg/dL (ref 8–23)
CO2: <7 mmol/L — ABNORMAL LOW (ref 22–32)
CO2: 8 mmol/L — ABNORMAL LOW (ref 22–32)
CO2: 11 mmol/L — ABNORMAL LOW (ref 22–32)
Calcium: 8.2 mg/dL — ABNORMAL LOW (ref 8.9–10.3)
Calcium: 8.1 mg/dL — ABNORMAL LOW (ref 8.9–10.3)
Calcium: 8 mg/dL — ABNORMAL LOW (ref 8.9–10.3)
Chloride: 106 mmol/L (ref 98–111)
Chloride: 106 mmol/L (ref 98–111)
Chloride: 107 mmol/L (ref 98–111)
Creatinine, Ser: 1.8 mg/dL — ABNORMAL HIGH (ref 0.44–1.00)
Creatinine, Ser: 1.82 mg/dL — ABNORMAL HIGH (ref 0.44–1.00)
Creatinine, Ser: 1.76 mg/dL — ABNORMAL HIGH (ref 0.44–1.00)
GFR, Estimated: 30 mL/min — ABNORMAL LOW (ref >60)
GFR, Estimated: 30 mL/min — ABNORMAL LOW (ref >60)
GFR, Estimated: 31 mL/min — ABNORMAL LOW (ref >60)
Glucose, Bld: 140 mg/dL — ABNORMAL HIGH (ref 70–99)
Glucose, Bld: 139 mg/dL — ABNORMAL HIGH (ref 70–99)
Glucose, Bld: 176 mg/dL — ABNORMAL HIGH (ref 70–99)
Potassium: 4.3 mmol/L (ref 3.5–5.1)
Potassium: 4 mmol/L (ref 3.5–5.1)
Potassium: 3.8 mmol/L (ref 3.5–5.1)
Sodium: 143 mmol/L (ref 135–145)
Sodium: 142 mmol/L (ref 135–145)
Sodium: 140 mmol/L (ref 135–145)

## 2021-03-13 LAB — I-STAT VENOUS BLOOD GAS, ED
Acid-base deficit: 22 mmol/L — ABNORMAL HIGH (ref 0.0–2.0)
Acid-base deficit: 17 mmol/L — ABNORMAL HIGH (ref 0.0–2.0)
Bicarbonate: 5.1 mmol/L — ABNORMAL LOW (ref 20.0–28.0)
Bicarbonate: 7.3 mmol/L — ABNORMAL LOW (ref 20.0–28.0)
Calcium, Ion: 0.93 mmol/L — ABNORMAL LOW (ref 1.15–1.40)
Calcium, Ion: 0.99 mmol/L — ABNORMAL LOW (ref 1.15–1.40)
HCT: 45 % (ref 36.0–46.0)
HCT: 44 % (ref 36.0–46.0)
Hemoglobin: 15.3 g/dL — ABNORMAL HIGH (ref 12.0–15.0)
Hemoglobin: 15 g/dL (ref 12.0–15.0)
O2 Saturation: 99 %
O2 Saturation: 97 %
Potassium: 4.3 mmol/L (ref 3.5–5.1)
Potassium: 4 mmol/L (ref 3.5–5.1)
Sodium: 140 mmol/L (ref 135–145)
Sodium: 141 mmol/L (ref 135–145)
TCO2: 6 mmol/L — ABNORMAL LOW (ref 22–32)
TCO2: 8 mmol/L — ABNORMAL LOW (ref 22–32)
pCO2, Ven: 15.5 mmHg — CL (ref 44.0–60.0)
pCO2, Ven: 15.5 mmHg — CL (ref 44.0–60.0)
pH, Ven: 7.127 — CL (ref 7.250–7.430)
pH, Ven: 7.282 (ref 7.250–7.430)
pO2, Ven: 147 mmHg — ABNORMAL HIGH (ref 32.0–45.0)
pO2, Ven: 96 mmHg — ABNORMAL HIGH (ref 32.0–45.0)

## 2021-03-13 LAB — URINALYSIS, ROUTINE W REFLEX MICROSCOPIC
Bilirubin Urine: NEGATIVE
Glucose, UA: NEGATIVE mg/dL
Ketones, ur: 80 mg/dL — AB
Leukocytes,Ua: NEGATIVE
Nitrite: NEGATIVE
Protein, ur: 100 mg/dL — AB
Specific Gravity, Urine: >1.046 — ABNORMAL HIGH (ref 1.005–1.030)
pH: 6 (ref 5.0–8.0)

## 2021-03-13 LAB — COMPREHENSIVE METABOLIC PANEL
ALT: 77 U/L — ABNORMAL HIGH (ref 0–44)
AST: 212 U/L — ABNORMAL HIGH (ref 15–41)
Albumin: 4.2 g/dL (ref 3.5–5.0)
Alkaline Phosphatase: 105 U/L (ref 38–126)
BUN: 16 mg/dL (ref 8–23)
CO2: <7 mmol/L — ABNORMAL LOW (ref 22–32)
Calcium: 8.5 mg/dL — ABNORMAL LOW (ref 8.9–10.3)
Chloride: 102 mmol/L (ref 98–111)
Creatinine, Ser: 1.46 mg/dL — ABNORMAL HIGH (ref 0.44–1.00)
GFR, Estimated: 39 mL/min — ABNORMAL LOW (ref >60)
Glucose, Bld: 113 mg/dL — ABNORMAL HIGH (ref 70–99)
Potassium: 4.6 mmol/L (ref 3.5–5.1)
Sodium: 141 mmol/L (ref 135–145)
Total Bilirubin: 2 mg/dL — ABNORMAL HIGH (ref 0.3–1.2)
Total Protein: 8.3 g/dL — ABNORMAL HIGH (ref 6.5–8.1)

## 2021-03-13 LAB — PROTIME-INR
INR: 1.2 (ref 0.8–1.2)
Prothrombin Time: 15.6 seconds — ABNORMAL HIGH (ref 11.4–15.2)

## 2021-03-13 LAB — CBC
HCT: 50.4 % — ABNORMAL HIGH (ref 36.0–46.0)
Hemoglobin: 15.2 g/dL — ABNORMAL HIGH (ref 12.0–15.0)
MCH: 32.7 pg (ref 26.0–34.0)
MCHC: 30.2 g/dL (ref 30.0–36.0)
MCV: 108.4 fL — ABNORMAL HIGH (ref 80.0–100.0)
Platelets: 156 10*3/uL (ref 150–400)
RBC: 4.65 MIL/uL (ref 3.87–5.11)
RDW: 15.1 % (ref 11.5–15.5)
WBC: 12.9 10*3/uL — ABNORMAL HIGH (ref 4.0–10.5)
nRBC: 0.2 % (ref 0.0–0.2)

## 2021-03-13 LAB — LIPASE, BLOOD: Lipase: 447 U/L — ABNORMAL HIGH (ref 11–51)

## 2021-03-13 LAB — TROPONIN I (HIGH SENSITIVITY)
Troponin I (High Sensitivity): 13 ng/L (ref <18)
Troponin I (High Sensitivity): 19 ng/L — ABNORMAL HIGH (ref <18)

## 2021-03-13 LAB — OSMOLALITY: Osmolality: 319 mOsm/kg — ABNORMAL HIGH (ref 275–295)

## 2021-03-13 LAB — LACTIC ACID, PLASMA
Lactic Acid, Venous: 3.8 mmol/L (ref 0.5–1.9)
Lactic Acid, Venous: 2 mmol/L (ref 0.5–1.9)

## 2021-03-13 LAB — ETHANOL: Alcohol, Ethyl (B): <10 mg/dL (ref <10)

## 2021-03-13 LAB — RESP PANEL BY RT-PCR (FLU A&B, COVID) ARPGX2
Influenza A by PCR: NEGATIVE
Influenza B by PCR: NEGATIVE
SARS Coronavirus 2 by RT PCR: NEGATIVE

## 2021-03-13 LAB — HEMOGLOBIN: Hemoglobin: 13.7 g/dL (ref 12.0–15.0)

## 2021-03-13 LAB — CBG MONITORING, ED: Glucose-Capillary: 124 mg/dL — ABNORMAL HIGH (ref 70–99)

## 2021-03-13 LAB — OCCULT BLOOD X 1 CARD TO LAB, STOOL: Fecal Occult Bld: NEGATIVE

## 2021-03-13 MED ORDER — LACTATED RINGERS IV BOLUS
1000.0000 mL | Freq: Once | INTRAVENOUS | Status: AC
  Administered 2021-03-13: 1000 mL via INTRAVENOUS

## 2021-03-13 MED ORDER — ONDANSETRON HCL 4 MG/2ML IJ SOLN
4.0000 mg | Freq: Once | INTRAMUSCULAR | Status: AC
  Administered 2021-03-13: 4 mg via INTRAVENOUS
  Filled 2021-03-13: qty 2

## 2021-03-13 MED ORDER — LORAZEPAM 2 MG/ML IJ SOLN
1.0000 mg | Freq: Once | INTRAMUSCULAR | Status: AC
  Administered 2021-03-13: 1 mg via INTRAVENOUS
  Filled 2021-03-13: qty 1

## 2021-03-13 MED ORDER — IOHEXOL 300 MG/ML  SOLN
75.0000 mL | Freq: Once | INTRAMUSCULAR | Status: AC | PRN
  Administered 2021-03-13: 75 mL via INTRAVENOUS

## 2021-03-13 MED ORDER — THIAMINE HCL 100 MG/ML IJ SOLN: 100.0000 mg | Freq: Every day | INTRAMUSCULAR | Status: DC

## 2021-03-13 MED ORDER — LORAZEPAM 2 MG/ML IJ SOLN
1.0000 mg | INTRAMUSCULAR | Status: DC | PRN
  Administered 2021-03-13: 3 mg via INTRAVENOUS
  Administered 2021-03-15 (×2): 1 mg via INTRAVENOUS
  Filled 2021-03-13: qty 1
  Filled 2021-03-13: qty 2
  Filled 2021-03-13: qty 1

## 2021-03-13 MED ORDER — PROCHLORPERAZINE EDISYLATE 10 MG/2ML IJ SOLN
10.0000 mg | Freq: Once | INTRAMUSCULAR | Status: AC
  Administered 2021-03-13: 10 mg via INTRAVENOUS
  Filled 2021-03-13: qty 2

## 2021-03-13 MED ORDER — SODIUM CHLORIDE 0.9 % IV BOLUS
1000.0000 mL | Freq: Once | INTRAVENOUS | Status: AC
  Administered 2021-03-13: 1000 mL via INTRAVENOUS

## 2021-03-13 MED ORDER — FOLIC ACID 1 MG PO TABS
1.0000 mg | ORAL_TABLET | Freq: Every day | ORAL | Status: DC
  Administered 2021-03-14 – 2021-03-18 (×5): 1 mg via ORAL
  Filled 2021-03-13 (×6): qty 1

## 2021-03-13 MED ORDER — LACTATED RINGERS IV SOLN: INTRAVENOUS | Status: DC

## 2021-03-13 MED ORDER — PANTOPRAZOLE SODIUM 40 MG IV SOLR
40.0000 mg | Freq: Two times a day (BID) | INTRAVENOUS | Status: DC
  Administered 2021-03-13 – 2021-03-15 (×4): 40 mg via INTRAVENOUS
  Filled 2021-03-13 (×4): qty 40

## 2021-03-13 MED ORDER — ONDANSETRON HCL 4 MG/2ML IJ SOLN
4.0000 mg | Freq: Four times a day (QID) | INTRAMUSCULAR | Status: DC | PRN
  Administered 2021-03-13: 4 mg via INTRAVENOUS
  Filled 2021-03-13: qty 2

## 2021-03-13 MED ORDER — THIAMINE HCL 100 MG PO TABS: 100.0000 mg | ORAL_TABLET | Freq: Every day | ORAL | Status: DC

## 2021-03-13 MED ORDER — ADULT MULTIVITAMIN W/MINERALS CH
1.0000 | ORAL_TABLET | Freq: Every day | ORAL | Status: DC
  Administered 2021-03-15 – 2021-03-18 (×4): 1 via ORAL
  Filled 2021-03-13 (×5): qty 1

## 2021-03-13 MED ORDER — LORAZEPAM 1 MG PO TABS
1.0000 mg | ORAL_TABLET | ORAL | Status: DC | PRN
  Administered 2021-03-14: 1 mg via ORAL
  Filled 2021-03-13: qty 1

## 2021-03-13 MED ORDER — HYDROMORPHONE HCL 1 MG/ML IJ SOLN: 0.5000 mg | INTRAMUSCULAR | Status: DC | PRN

## 2021-03-13 NOTE — ED Notes (Signed)
Purewick placed, pt tolerated well.

## 2021-03-13 NOTE — H&P (View-Only) (Signed)
Consultation  Referring Provider:   Dr. Alvino Chapel Primary Care Physician:  Patient, No Pcp Per (Inactive) Primary Gastroenterologist: Dr. Bryan Lemma       Reason for Consultation:   Hematemesis and epigastric pain         HPI:   Debra Jackson is a 65 y.o. female with a past medical history as listed below including anxiety, depression and polysubstance abuse, who presented to the ER today with an episode of hematemesis and epigastric pain.    Today, patient tells me that she has been vomiting in the mornings when she wakes up for the past 4 to 5 days, but then will seem to feel somewhat better throughout the rest of the day, this morning when she woke up she started vomiting even more, at least 20 times since she arrived in the ER and the last about 25 minutes ago all with copious amounts of black/brown material and even some bright red blood.  Tells me this vomiting has been very "forceful", and even "makes me pee" when she has to vomit.  Along with this has developed a new epigastric pain which she describes as a "tightness" rated as a 6-10/2008 as of this morning.  Explained that the medicine she received in the ER helped initially but she is starting to feel the nausea come back.  Also describes some muscle spasms in her back.  Also reports a black stool off and on for many years, nothing recently.    Per patient she did follow with colorectal surgery in regards to large polyp found at time of colonoscopy, but they wanted to have her follow with a PCP (she does not have 1) and get approved for surgery.  She is since then not followed up.    Does continue to drink significant amounts of alcohol, her husband reported at least a quart of vodka daily.  She tells me 2-3 beers per day for years.  Of note, was admitted for a suicide attempt 02/07/2021.    Denies fever, chills, NSAID use or weight loss.     ED course: CT AP with mild stranding in the peripancreatic fat suggestive of mild  pancreatitis, persistent abnormal soft tissue density in the sigmoid colon as seen on prior study 06/02/2020, severe fatty infiltration of the liver, nonobstructing left upper pole renal stone, moderate size hiatal hernia, labs with creatinine elevated at 1.46, BUN normal at 16, AST 212, ALT 77, lipase 447, total bilirubin 2.0, MCV elevated 50.4, hemoglobin 15.2, white count 12.9     GI history: 09/17/2020 colonoscopy with Dr. Bryan Lemma: Findings: - One 15 mm polyp in the ascending colon, removed with a cold snare. Resected and retrieved. - One 12 mm polyp in the descending colon, removed with a hot snare. Resected and retrieved. - Partially obstructing tumor in the sigmoid colon. Biopsied. Tattooed. - One 18 mm polyp in the sigmoid colon, removed with a hot snare. Resected and retrieved. - Two 3 to 5 mm polyps in the rectum, removed with a cold snare. Resected and retrieved. - Non-bleeding internal hemorrhoids. - There was significant looping of the colon. Plan:  Check CT/CEA and referred to colorectal surgery Pathology showed several tubular adenomas and the large polypoid masslike lesion was biopsied and demonstrated tubulovillous adenoma without high-grade dysplasia or malignancy, CEA was normal, she had already had a referral to Dr. Dema Severin in [place] for surgical resection, repeat colonoscopy recommended in a year  08/06/2020 office visit for follow-up after ER below:  At that time presented with her husband, had been having some diarrhea with nausea and vomiting but not for at least a month, chronic right lower quadrant pain; plan: Scheduled for colonoscopy  06/02/2020 ER visit: For anxiety, shaking, nausea and vomiting, had been without her Klonopin for 5 days, at that time reported that she drank alcohol every day with elevated LFTs AST 125 and ALT 255, CT of the abdomen pelvis showed soft tissue prominence noted in the proximal descending colon without appreciable associated wall thickening, slight  narrowing of the sigmoid colon immediately distal to the area  Past Medical History:  Diagnosis Date  . Anxiety   . Depression   . GERD (gastroesophageal reflux disease)   . Opiate abuse, continuous ([place])   . Polysubstance abuse [facility])     Past Surgical History:  Procedure Laterality Date  . TONSILLECTOMY      Family History  Adopted: Yes    Social History   Tobacco Use  . Smoking status: Former    Types: Cigarettes  . Smokeless tobacco: Never  Vaping Use  . Vaping Use: Never used  Substance Use Topics  . Alcohol use: Yes    Alcohol/week: 2.0 - 4.0 standard drinks    Types: 2 - 4 Glasses of wine per week    Comment: daily  . Drug use: Not Currently    Types: Cocaine    Comment: heroin    Prior to Admission medications   Medication Sig Start Date End Date Taking? Authorizing Provider  citalopram (CELEXA) 20 MG tablet Take 20 mg by mouth daily.    [provider]  clonazePAM (KLONOPIN) 0.5 MG tablet Take 1 mg by mouth 2 (two) times daily as needed for anxiety.    [provider]  ibuprofen (ADVIL) 400 MG tablet Take 400 mg by mouth every 6 (six) hours as needed for moderate pain, headache or cramping.    [provider]  QUEtiapine (SEROQUEL) 50 MG tablet Take 50 mg by mouth at bedtime. 06/28/20   [provider]  REXULTI 0.5 MG TABS Take 0.5 mg by mouth daily. 07/28/20   [provider]  VYVANSE 30 MG capsule Take 30 mg by mouth every morning. 08/29/20   [provider]    Current Facility-Administered Medications  Medication Dose Route Frequency Provider Last Rate Last Admin  . lactated ringers bolus 1,000 mL  1,000 mL Intravenous Once Evlyn Courier, PA-C       Current Outpatient Medications  Medication Sig Dispense Refill  . citalopram (CELEXA) 20 MG tablet Take 20 mg by mouth daily.    . clonazePAM (KLONOPIN) 0.5 MG tablet Take 1 mg by mouth 2 (two) times daily as needed for anxiety.    Marland Kitchen ibuprofen (ADVIL) 400 MG  tablet Take 400 mg by mouth every 6 (six) hours as needed for moderate pain, headache or cramping.    Marland Kitchen QUEtiapine (SEROQUEL) 50 MG tablet Take 50 mg by mouth at bedtime.    Marland Kitchen REXULTI 0.5 MG TABS Take 0.5 mg by mouth daily.    Marland Kitchen VYVANSE 30 MG capsule Take 30 mg by mouth every morning.      Allergies as of 03/13/2021 - Review Complete 03/13/2021  Allergen Reaction Noted  . Penicillins Nausea And Vomiting 09/01/2012     Review of Systems:    Constitutional: No weight loss, fever or chills Skin: No rash  Cardiovascular: No chest pain Respiratory: No SOB  Gastrointestinal: See HPI and otherwise negative Genitourinary: No dysuria  Neurological: No headache, dizziness or syncope Musculoskeletal: No new muscle or joint pain Hematologic: No bruising Psychiatric:+depression and anxiety   Physical Exam:  Vital signs in last 24 hours: Temp:  [97.3 F (36.3 C)-98 F (36.7 C)] 97.3 F (36.3 C) (11/18 1537) Pulse Rate:  [106-122] 106 (11/18 1537) Resp:  [22-28] 28 (11/18 1537) BP: (131-158)/(60-81) 158/79 (11/18 1537) SpO2:  [95 %-97 %] 96 % (11/18 1537)   General:   Pleasant chronically ill-appearing Caucasian female appears to be in NAD, Well developed, Well nourished, alert and cooperative Head:  Normocephalic and atraumatic. Eyes:   PEERL, EOMI. No icterus. Conjunctiva pink. Ears:  Normal auditory acuity. Neck:  Supple Throat: Oral cavity and pharynx without inflammation, swelling or lesion. + Dried blood on lips and teeth, black in color Lungs: Respirations even and unlabored. Lungs clear to auscultation bilaterally.   No wheezes, crackles, or rhonchi.  Heart: Normal S1, S2. No MRG. Regular rate and rhythm. No peripheral edema, cyanosis or pallor.  Abdomen:  Soft, nondistended, moderate epigastric TTP with some involuntary guarding.  Normal bowel sounds. No appreciable masses or hepatomegaly. Rectal:  Not performed.  Msk:  Symmetrical without gross deformities. Peripheral pulses  intact.  Extremities:  Without edema, no deformity or joint abnormality.  Neurologic:  Alert and  oriented x4;  grossly normal neurologically.  Skin:   Dry and intact without significant lesions or rashes. Psychiatric: Demonstrates good judgement and reason without abnormal affect or behaviors.   LAB RESULTS: Recent Labs    03/13/21 1008  WBC 12.9*  HGB 15.2*  HCT 50.4*  PLT 156   BMET Recent Labs    03/13/21 1008  NA 141  K 4.6  CL 102  CO2 <7*  GLUCOSE 113*  BUN 16  CREATININE 1.46*  CALCIUM 8.5*   Hepatic Function Latest Ref Rng & Units 03/13/2021 02/07/2021 06/02/2020  Total Protein 6.5 - 8.1 g/dL 8.3(H) 7.2 8.1  Albumin 3.5 - 5.0 g/dL 4.2 3.7 4.6  AST 15 - 41 U/L 212(H) 64(H) 125(H)  ALT 0 - 44 U/L 77(H) 37 55(H)  Alk Phosphatase 38 - 126 U/L 105 79 69  Total Bilirubin 0.3 - 1.2 mg/dL 2.0(H) 0.6 1.2    STUDIES: DG Chest 2 View  Result Date: 03/13/2021 CLINICAL DATA:  Chest pain, vomiting EXAM: CHEST - 2 VIEW COMPARISON:  03/20/2019 FINDINGS: Cardiac size is within normal limits. There is soft tissue fullness in the retrocardiac region suggesting fixed hiatal hernia. Right hemidiaphragm is elevated. There are no signs of pulmonary edema or focal pulmonary consolidation. There is no pleural effusion or pneumothorax. IMPRESSION: There are no signs of alveolar pulmonary edema or focal pulmonary consolidation. There is soft tissue fullness in the retrocardiac region suggesting moderate sized fixed hiatal hernia. Elevation of right hemidiaphragm may be due to eventration or paralysis. Electronically Signed   By: Elmer Picker M.D.   On: 03/13/2021 10:56   CT ABDOMEN PELVIS W CONTRAST  Result Date: 03/13/2021 CLINICAL DATA:  Epigastric pain, hematemesis EXAM: CT ABDOMEN AND PELVIS WITH CONTRAST TECHNIQUE: Multidetector CT imaging of the abdomen and pelvis was performed using the standard protocol following bolus administration of intravenous contrast. CONTRAST:  51m  OMNIPAQUE IOHEXOL 300 MG/ML  SOLN COMPARISON:  CT abdomen/pelvis 06/02/2020 FINDINGS: Lower chest: The lung bases are clear. Hepatobiliary: The liver is markedly hypoattenuating consistent with severe fatty infiltration. There are no focal lesions. The gallbladder is unremarkable. There is no biliary ductal dilatation. Pancreas: There is mild stranding in the fat  surrounding the pancreas. The pancreatic parenchyma enhances normally. There are no focal lesions or contour abnormalities. There is no main pancreatic ductal dilatation. Spleen: Unremarkable. Adrenals/Urinary Tract: The adrenals are unremarkable. There is a 4 mm nonobstructing left upper pole renal stone. There are no focal renal lesions. There is no hydronephrosis or hydroureter. The bladder is decompressed but grossly unremarkable. Stomach/Bowel: There is a moderate size hiatal hernia. The stomach is otherwise unremarkable. There is no evidence of bowel obstruction. The colon is nearly entirely decompressed, but there is an approximately 5.4 cm by 4.0 cm soft tissue density lesion in the sigmoid colon in a similar location to the previously described soft tissue density on the study of 06/02/2020. There is no evidence of obstruction due to this lesion. Otherwise, there is no abnormal bowel wall thickening or inflammatory change. Vascular/Lymphatic: There is minimal calcified atherosclerotic plaque in the nonaneurysmal abdominal aorta. The major branch vessels are patent. The main portal and splenic veins are patent. There is no abdominal or pelvic lymphadenopathy. Reproductive: The uterus and adnexa are unremarkable. Other: There is no ascites or free air. Musculoskeletal: There is a remote fracture of the right inferior pubic ramus. There is unchanged mild compression deformity of the L1 vertebral body and advanced degenerative change at L4-L5. There is no acute osseous abnormality or aggressive osseous lesion. IMPRESSION: 1. Mild stranding in the  peripancreatic fat is suggestive of mild pancreatitis, given elevated lipase. There is no evidence of necrosis. 2. Persistent abnormal soft tissue density in the sigmoid colon, as seen on the prior study from 06/02/2020. Recommend colonoscopy to evaluate for neoplasm. 3. Severe fatty infiltration of the liver. 4. Nonobstructing left upper pole renal stone. 5. Moderate size hiatal hernia. Electronically Signed   By: Valetta Mole M.D.   On: 03/13/2021 14:30     Impression / Plan:   Impression: 1.  Pancreatitis: With nausea, vomiting and epigastric pain, lipase elevated in the 400s, history of alcohol abuse which is likely the cause 2.  Hematemesis: With above, started this morning with 20+ episodes; likely related to alcoholic pancreatitis and possibly Mallory-Weiss tear +/- esophagitis +/- gastritis 3.  Tubulovillous adenoma: Never removed after time of last colonoscopy in May even though patient was referred to colorectal surgery at the time  4.  Alcohol abuse: At least 3 beers a day for years, patient's husband reports a quart of vodka daily which she does not tell me at time of interview 5.  Fatty liver: Likely related to alcohol abuse; now with signs of likely acute alcoholic hepatitis  Plan: 1.  Recommend CIWA protocol 2.  Aggressive fluid resuscitation given pancreatitis 3.  Patient to remain n.p.o. now with sips and chips and strict n.p.o. at [facility] plans for EGD tomorrow to evaluate hematemesis.  Did discuss risks, benefits, limitations and alternatives and patient agrees to proceed.  This is scheduled with Dr. Ardis Hughs. 4.  Start Pantoprazole 40 mg IV twice daily 5.  Continue as needed analgesics and antiemetics 6.  Could consider passed by with the surgical team while she is in the hospital so they can further discuss removal of this tubulovillous adenoma 7.  Abstain from alcohol.  Discussed this with the patient today.  She tells me "I did not know it was such a problem" 8. Ordered  COVID testing to be completed this evening.  Also ordered repeat CBC/CMP for in the morning as well as hemoglobin every 12 for now unless she starts showing further signs of bleeding, at which  time this may need to be increased to every 6-8 hours.  Thank you for your kind consultation, we will continue to follow.  Lavone Nian [facility]  03/13/2021, 4:04 PM   ________________________________________________________________________  Velora Heckler GI MD note:  I personally examined the patient, reviewed the data and agree with the assessment and plan described above. She has pancreatitis and etoh related hepatitis.  Does have gallstones in her GB and so perhaps the AP is gallstone related.  Hard to know for certain. I am planning EGD today to evaluate her hematemesis, note that HB is still normal.   Owens Loffler, MD [facility]astro[facility] Pager [phone]

## 2021-03-13 NOTE — ED Provider Notes (Signed)
Informed by nurse tech in triage the patient is reporting hematemesis and lightheadedness.  Emesis bag does show what appears to be coffee-ground emesis.  Review of labs shows patient with elevated lipase.  Hemoglobin and hematocrit increased likely secondary to dehydration.  We will add on CT abdomen pelvis at this time.  Spoke to charge nurse to move patient back to next available bed.   Loni Beckwith, PA-C 03/13/21 1330    Hayden Rasmussen, MD 03/13/21 1827

## 2021-03-13 NOTE — ED Provider Notes (Signed)
[facility] EMERGENCY DEPARTMENT Provider Note   CSN: [id number] Arrival date & time: 03/13/21  8921     History Chief Complaint  Patient presents with   Nausea   Emesis    Debra Jackson is a 65 y.o. female.  65 year old female presents today with her husband for evaluation of hematemesis since this morning.  Patient reports for the past 3 to 4 days she has had nausea vomiting has been unable to tolerate p.o. intake.  She reports as of this morning she also has epigastric abdominal pain along with weakness.  She denies chest pain, diaphoresis, lightheadedness, or palpitations.  She denies the use of NSAIDs.  Denies tobacco use but does drink significant amounts of alcohol.  Husband reports at least a quart of vodka daily.  Of note she had an abnormal CT scan beginning of this year for which she followed up with GI and had a colonoscopy done.  Polyp was biopsied and patient was referred to colorectal surgeon.  Husband reports they have not had any surgery at this time because patient was not ready for colostomy bag.  She denies any unintentional weight loss, fever or chills over the past several days.  She is unsure of the color of her stools.  The history is provided by the patient. No language interpreter was used.      Past Medical History:  Diagnosis Date   Anxiety    Depression    GERD (gastroesophageal reflux disease)    Opiate abuse, continuous ([place])    Polysubstance abuse ([place])     Patient Active Problem List   Diagnosis Date Noted   MDD (major depressive disorder), recurrent episode, severe ([place]) 02/10/2021   ADHD 02/10/2021   Major depressive disorder, recurrent ([place]) 02/09/2021   Suicide attempt [facility])     Past Surgical History:  Procedure Laterality Date   TONSILLECTOMY       OB History   No obstetric history on file.     Family History  Adopted: Yes    Social History   Tobacco Use   Smoking status: Former    Types: Cigarettes    Smokeless tobacco: Never  Vaping Use   Vaping Use: Never used  Substance Use Topics   Alcohol use: Yes    Alcohol/week: 2.0 - 4.0 standard drinks    Types: 2 - 4 Glasses of wine per week    Comment: daily   Drug use: Not Currently    Types: Cocaine    Comment: heroin    Home Medications Prior to Admission medications   Medication Sig Start Date End Date Taking? Authorizing Provider  citalopram (CELEXA) 20 MG tablet Take 20 mg by mouth daily.    [provider]  clonazePAM (KLONOPIN) 0.5 MG tablet Take 1 mg by mouth 2 (two) times daily as needed for anxiety.    [provider]  ibuprofen (ADVIL) 400 MG tablet Take 400 mg by mouth every 6 (six) hours as needed for moderate pain, headache or cramping.    [provider]  QUEtiapine (SEROQUEL) 50 MG tablet Take 50 mg by mouth at bedtime. 06/28/20   [provider]  REXULTI 0.5 MG TABS Take 0.5 mg by mouth daily. 07/28/20   [provider]  VYVANSE 30 MG capsule Take 30 mg by mouth every morning. 08/29/20   [provider]    Allergies    Penicillins  Review of Systems   Review of Systems  Constitutional:  Positive  for appetite change and fatigue. Negative for activity change, chills, diaphoresis and fever.  Respiratory:  Negative for shortness of breath.   Cardiovascular:  Negative for chest pain and palpitations.  Gastrointestinal:  Positive for abdominal pain, nausea and vomiting. Negative for constipation and diarrhea.  Skin:  Negative for color change.  Neurological:  Positive for weakness. Negative for light-headedness.  All other systems reviewed and are negative.  Physical Exam Updated Vital Signs BP 131/81 (BP Location: Left Arm)   Pulse (!) 111   Temp 98 F (36.7 C)   Resp (!) 22   SpO2 97%   Physical Exam Vitals and nursing note reviewed. Exam conducted with a chaperone present.  Constitutional:      General: She is not in acute distress.    Appearance: She is  ill-appearing.  HENT:     Head: Normocephalic and atraumatic.     Nose: Nose normal.  Eyes:     General: No scleral icterus.    Extraocular Movements: Extraocular movements intact.     Conjunctiva/sclera: Conjunctivae normal.  Cardiovascular:     Rate and Rhythm: Regular rhythm. Tachycardia present.     Pulses: Normal pulses.     Heart sounds: Normal heart sounds.  Pulmonary:     Effort: Pulmonary effort is normal. No respiratory distress.     Breath sounds: Normal breath sounds. No wheezing or rales.  Abdominal:     General: There is no distension.     Tenderness: There is abdominal tenderness in the epigastric area. There is no guarding or rebound.  Genitourinary:    Rectum: Normal. No tenderness, anal fissure or external hemorrhoid.  Musculoskeletal:        General: Normal range of motion.     Cervical back: Normal range of motion.  Skin:    General: Skin is warm and dry.  Neurological:     General: No focal deficit present.     Mental Status: She is alert. Mental status is at baseline.    ED Results / Procedures / Treatments   Labs (all labs ordered are listed, but only abnormal results are displayed) Labs Reviewed  LIPASE, BLOOD - Abnormal; Notable for the following components:      Result Value   Lipase 447 (*)    All other components within normal limits  COMPREHENSIVE METABOLIC PANEL - Abnormal; Notable for the following components:   CO2 <7 (*)    Glucose, Bld 113 (*)    Creatinine, Ser 1.46 (*)    Calcium 8.5 (*)    Total Protein 8.3 (*)    AST 212 (*)    ALT 77 (*)    Total Bilirubin 2.0 (*)    GFR, Estimated 39 (*)    All other components within normal limits  CBC - Abnormal; Notable for the following components:   WBC 12.9 (*)    Hemoglobin 15.2 (*)    HCT 50.4 (*)    MCV 108.4 (*)    All other components within normal limits  CBG MONITORING, ED - Abnormal; Notable for the following components:   Glucose-Capillary 124 (*)    All other components  within normal limits  URINALYSIS, ROUTINE W REFLEX MICROSCOPIC  TROPONIN I (HIGH SENSITIVITY)  TROPONIN I (HIGH SENSITIVITY)    EKG None  Radiology DG Chest 2 View  Result Date: 03/13/2021 CLINICAL DATA:  Chest pain, vomiting EXAM: CHEST - 2 VIEW COMPARISON:  03/20/2019 FINDINGS: Cardiac size is within normal limits. There is soft tissue fullness  in the retrocardiac region suggesting fixed hiatal hernia. Right hemidiaphragm is elevated. There are no signs of pulmonary edema or focal pulmonary consolidation. There is no pleural effusion or pneumothorax. IMPRESSION: There are no signs of alveolar pulmonary edema or focal pulmonary consolidation. There is soft tissue fullness in the retrocardiac region suggesting moderate sized fixed hiatal hernia. Elevation of right hemidiaphragm may be due to eventration or paralysis. Electronically Signed   By: Elmer Picker M.D.   On: 03/13/2021 10:56   CT ABDOMEN PELVIS W CONTRAST  Result Date: 03/13/2021 CLINICAL DATA:  Epigastric pain, hematemesis EXAM: CT ABDOMEN AND PELVIS WITH CONTRAST TECHNIQUE: Multidetector CT imaging of the abdomen and pelvis was performed using the standard protocol following bolus administration of intravenous contrast. CONTRAST:  56mL OMNIPAQUE IOHEXOL 300 MG/ML  SOLN COMPARISON:  CT abdomen/pelvis 06/02/2020 FINDINGS: Lower chest: The lung bases are clear. Hepatobiliary: The liver is markedly hypoattenuating consistent with severe fatty infiltration. There are no focal lesions. The gallbladder is unremarkable. There is no biliary ductal dilatation. Pancreas: There is mild stranding in the fat surrounding the pancreas. The pancreatic parenchyma enhances normally. There are no focal lesions or contour abnormalities. There is no main pancreatic ductal dilatation. Spleen: Unremarkable. Adrenals/Urinary Tract: The adrenals are unremarkable. There is a 4 mm nonobstructing left upper pole renal stone. There are no focal renal lesions.  There is no hydronephrosis or hydroureter. The bladder is decompressed but grossly unremarkable. Stomach/Bowel: There is a moderate size hiatal hernia. The stomach is otherwise unremarkable. There is no evidence of bowel obstruction. The colon is nearly entirely decompressed, but there is an approximately 5.4 cm by 4.0 cm soft tissue density lesion in the sigmoid colon in a similar location to the previously described soft tissue density on the study of 06/02/2020. There is no evidence of obstruction due to this lesion. Otherwise, there is no abnormal bowel wall thickening or inflammatory change. Vascular/Lymphatic: There is minimal calcified atherosclerotic plaque in the nonaneurysmal abdominal aorta. The major branch vessels are patent. The main portal and splenic veins are patent. There is no abdominal or pelvic lymphadenopathy. Reproductive: The uterus and adnexa are unremarkable. Other: There is no ascites or free air. Musculoskeletal: There is a remote fracture of the right inferior pubic ramus. There is unchanged mild compression deformity of the L1 vertebral body and advanced degenerative change at L4-L5. There is no acute osseous abnormality or aggressive osseous lesion. IMPRESSION: 1. Mild stranding in the peripancreatic fat is suggestive of mild pancreatitis, given elevated lipase. There is no evidence of necrosis. 2. Persistent abnormal soft tissue density in the sigmoid colon, as seen on the prior study from 06/02/2020. Recommend colonoscopy to evaluate for neoplasm. 3. Severe fatty infiltration of the liver. 4. Nonobstructing left upper pole renal stone. 5. Moderate size hiatal hernia. Electronically Signed   By: Valetta Mole M.D.   On: 03/13/2021 14:30    Procedures .Critical Care Performed by: Evlyn Courier, PA-C Authorized by: Evlyn Courier, PA-C   Critical care provider statement:    Critical care time (minutes):  45   Critical care was necessary to treat or prevent imminent or life-threatening  deterioration of the following conditions:  Shock   Critical care was time spent personally by me on the following activities:  Ordering and review of laboratory studies, ordering and review of radiographic studies, pulse oximetry, evaluation of patient's response to treatment, examination of patient, review of old charts, obtaining history from patient or surrogate, re-evaluation of patient's condition and discussions with  consultants   Medications Ordered in ED Medications  lactated ringers bolus 1,000 mL (has no administration in time range)  ondansetron (ZOFRAN) injection 4 mg (4 mg Intravenous Given 03/13/21 1037)  sodium chloride 0.9 % bolus 1,000 mL (1,000 mLs Intravenous New Bag/Given 03/13/21 1038)  ondansetron (ZOFRAN) injection 4 mg (4 mg Intravenous Given 03/13/21 1512)  iohexol (OMNIPAQUE) 300 MG/ML solution 75 mL (75 mLs Intravenous Contrast Given 03/13/21 1405)    ED Course  I have reviewed the triage vital signs and the nursing notes.  Pertinent labs & imaging results that were available during my care of the patient were reviewed by me and considered in my medical decision making (see chart for details).    MDM Rules/Calculators/A&P                           65 year old female presents today for evaluation of hematemesis and abdominal pain onset this morning.  Patient is tachycardic, tachypneic.  She has had nausea vomiting for 4 days at this point.  She reports she has not been able to tolerate p.o. intake during this timeframe.  CT abdomen pelvis significant for mild pancreatitis and fatty liver disease.  Patient does have mild leukocytosis on CBC and hemoglobin of 15 which is likely concentration due to dehydration which is further supported by the AKI with creatinine of 1.46.  Lipase elevated at 447, AST 212, ALT 77, and total bilirubin of 2.0.  Patient had a bicarb less than 7.  Will obtain VBG and repeat BMP.  2 L fluid boluses ordered.  patient's chart was reviewed and  patient found to have a patient has a history of abnormal CT which was followed up with a colonoscopy where patient had biopsy done which was negative for malignancy, and CEA was normal at the time.  Patient was referred to colorectal surgeon.  Digital rectal exam was done without any gross abnormalities and brown stool.  Will discuss case with gastroenterology.   Case discussed with Anderson Malta from [facility].  She will evaluate patient.  Recommends medicine admission.  Patient's VBG with pH of 7.127, bicarb of 5.1.  Of note this sample was collected prior to completion of her fluid boluses.  She remains without acute distress on exam.  Case discussed with hospitalist team who will evaluate patient for admission.  Final Clinical Impression(s) / ED Diagnoses Final diagnoses:  None    Rx / DC Orders ED Discharge Orders     None        Evlyn Courier, PA-C 03/13/21 1747    Davonna Belling, MD 03/14/21 [phone]

## 2021-03-13 NOTE — H&P (Signed)
Date: 03/13/2021               Patient Name:  Debra Jackson MRN: [id number]  DOB: May 22, 1955 Age / Sex: 65 y.o., female   PCP: Patient, No Pcp Per (Inactive)         Medical Service: Internal Medicine Teaching Service         Attending Physician: Dr. Daryll Drown    First Contact: Dr. Corky Sox Pager: [phone]  Second Contact: Dr. Gaylan Gerold Pager: [phone]       After Hours (After 5p/  First Contact Pager: [phone]  weekends / holidays): Second Contact Pager: [phone]   Chief Complaint: hematemesis  History of Present Illness:  [name] is a 65 year old female with a PMHx of GERD, depression, anxiety, opiate abuse, and polysubstance use, presenting to the ED with hematemesis.   Patient began experiencing vomiting 5 days ago. Starting this morning she developed coffee-ground colored vomit along with occasional dark red and bright red blood. She reports poor PO intake the past 5 days, saying that she simply vomits up whatever she eats or drinks. She reports no BM for the past 5 days because of the poor PO but does report one dark stool before that. She reports epigastric pain that does not radiate. She reports chest pain that feels like a muscle spasm, no tearing sensation. She reports shortness of breath and feeling lightheaded with tingling in the extremities. She reports no recent weight loss.   On chart review, patient had a colonoscopy in May 2022, which was notable for several tubular adenomas that were resected. There was a large polypoid mass-like lesion that was biopsied and demonstrated tubulovillous adenoma without high-grade dysplasia or malignancy. She was referred to colorectal surgery for resection, but this was never performed due to patient concerns regarding a potential colostomy.   Meds:  Celexa 20 mg daily Klonopin 0.5 mg BID PRN Ibuprofen 400 mg q6hr PRN  Seroquel 50 mg QHS Rexulti 0.5 mg daily Vyvanse 30 mg daily  No outpatient medications have been  marked as taking for the 03/13/21 encounter [facility] Encounter).     Allergies: Allergies as of 03/13/2021 - Review Complete 03/13/2021  Allergen Reaction Noted   Penicillins Nausea And Vomiting 09/01/2012   Past Medical History:  Diagnosis Date   Anxiety    Depression    GERD (gastroesophageal reflux disease)    Opiate abuse, continuous ([facility])    Polysubstance abuse ([place])     Family History: patient reports being adopted and states that she does not know her family medical history. She reports she has two children. Her son is in good health. Her daughter suffers from bipolar disorder and substance use.   Social History: patient lives with her husband, an environment that she says can be troubled at times, but is overall a safe place. She takes care of her ADLs and iADLs independently. She reports drinking 2-3 beers per day (although it appears husband has reported heavier alcohol use, 1 quart of vodka daily). She denies smoking cigarettes or using illegal drugs.   Review of Systems: A complete ROS was negative except as per HPI.   Physical Exam: Blood pressure (!) 169/95, pulse (!) 113, temperature (!) 97.3 F (36.3 C), temperature source Oral, resp. rate (!) 22, SpO2 96 %.  Physical Exam Vitals reviewed.  HENT:     Mouth/Throat:     Comments: Dark dried blood around mouth Eyes:     General: No scleral  icterus. Cardiovascular:     Rate and Rhythm: Regular rhythm.     Heart sounds: No murmur heard.    Comments: Tachycardic, strong pulses, non-diaphoretic, no lower extremity edema Abdominal:     General: Bowel sounds are normal.     Palpations: Abdomen is soft.     Comments: RUQ and epigastric tenderness  Neurological:     Mental Status: She is alert.     EKG: personally reviewed my interpretation is sinus tach with left axis deviation  CXR: personally reviewed my interpretation is no acute cardiopulmonary disease, elevated R hemidiaphragm   Assessment & Plan by  Problem: [name] is a 65 year old female with a PMHx of GERD, depression, anxiety, opiate use disorder, and polysubstance use, presenting to the ED with hematemesis, found to have pancreatitis.  #Nausea/vomiting #Hematemesis #Epigastric pain #Pancreatitis Patient developed nausea/vomiting several days ago and only recently developed hematemesis. Given the CT findings of mild peripancreatic fat stranding and lipase of 447, she has pancreatitis. Alcohol use is a possible cause but gallstones are also possible. She likely subsequently developed mallory-weiss tears that have resulted in hematemesis. Underlying peptic ulcer disease or other GI pathology such as gastritis is possible. Currently patient is tachypneic and tachyardic to the 120's, however, SBP is 140-170 and H/H is stable at 15/44. Patient is s/p 3L of LR. Lactate of 3.8.  -GI consult, appreciate recs -2 large bore IV -Hgb q12 hrs -Lactate q3hrs x2 -EGD tomorrow AM, per GI -RUQ [place], r/o gallstone pancreatitis -NPO with sips and chips -IV Protonix 40 mg BID -Zofran q6hr PRN -Additional 1L LR bolus now -mIVF at 100 mL/hr -Dilaudid 0.5 mg IV q4hrs PRN  #Acid-base disturbance #AGMA Bicarb <7 on admission, AG of 33. After 3L of fluids, bicarb is still low at 8. VBG showing pH of 7.1, which improved to 7.2 on recheck. Osmolar gap of 20, raising concern for potential alcohol intoxication.  -Ethanol level -1L LR bolus now -mIVF at 100 mL/hr  #Alcohol use disorder #Benzo dependence Patient with regular scripts for Klonopin 0.5 mg BID PRN and a history of alcohol use disorder.  -CIWA q4hrs, follow closely -No Ativan for now to avoid potential aspiration -Counsel on abstinence   #AKI Cr 1.8 currently on a normal baseline (0.7-0.8). Likely pre-renal given pancreatitis and poor PO. -Fluids as above -Continue to monitor  #Transaminitis AST/ALT of 212/77, has been elevated on previous CMPs. Likely some component of alcoholic  hepatitis. Patient has fatty liver on CT A/P. Tbili up to 2.0. No obvious stigmata of liver disease. If INR is above 1.7 patient may be Child-Pugh B and need further workup.  -Continue to follow with CMPs -Check INR -Hepatitis panel  #Tubulovillous adenoma Never removed despite referral to colorectal surgery. -Consider colorectal surgery consult   FEN/GI: 1L LR right now, mIVF at 100 mL/hr VTE PPX: SCDs given active bleeding  Dispo: Admit patient to Inpatient with expected length of stay greater than 2 midnights.   Signed: Corky Sox, MD PGY-1 Pager: [phone]

## 2021-03-13 NOTE — ED Notes (Signed)
Got patient into the bed on the monitor patient is resting with fasmily at bedside and call bell in reach

## 2021-03-13 NOTE — ED Triage Notes (Signed)
Pt. Vomiting at triage.

## 2021-03-13 NOTE — ED Notes (Signed)
Registration notified us that the pt is vomiting blood

## 2021-03-13 NOTE — ED Triage Notes (Signed)
IV 20 g left hand,  Given  Zofran 4 mg IV  920

## 2021-03-13 NOTE — ED Notes (Signed)
Spoke w/ MD, awaiting new orders

## 2021-03-13 NOTE — ED Notes (Signed)
PT c/o anxiety and nausea. Paged MD to update to condition and for new orders

## 2021-03-13 NOTE — ED Provider Notes (Signed)
Emergency Medicine Provider Triage Evaluation Note  Debra Jackson , a 65 y.o. female  was evaluated in triage.  Pt complains of vomiting for 4 days. Patient states that 4 days ago she started vomiting.  She states she is unable to keep any food down since then.  She has now been dry heaving continuously.  She says that she does not drink alcohol or use any other recreational drugs.  Denies any abdominal pain but does have some pressure in her chest and feels like it is squeezing.  Denies any melanotic stools.  Denies any constipation or diarrhea.   Patient with past medical history of polysubstance abuse, GERD, and anxiety.   Review of Systems  Positive: Nausea, vomiting, chest pressure Negative: Abdominal pain, constipation, diarrhea, melena  Physical Exam  BP (!) 144/60 (BP Location: Left Arm)   Pulse (!) 122   Temp 98 F (36.7 C)   Resp (!) 28   SpO2 95%  Gen:   Awake, actively dry heaving Resp:  Tachypneic secondary to dry heaving MSK:   Moves extremities without difficulty Other:  No abdominal tenderness to palpation.  Abdomen soft and nontender.  Medical Decision Making  Medically screening exam initiated at 10:30 AM.  Appropriate orders placed.  Debra Jackson was informed that the remainder of the evaluation will be completed by another provider, this initial triage assessment does not replace that evaluation, and the importance of remaining in the ED until their evaluation is complete.  Chest pain and abdominal pain labs and imaging ordered   Sheila Oats 03/13/21 1032    Hayden Rasmussen, MD 03/13/21 1827

## 2021-03-13 NOTE — ED Triage Notes (Signed)
EMS stated, N/v 4 days.

## 2021-03-13 NOTE — ED Notes (Signed)
Lactic Acid 3.8

## 2021-03-13 NOTE — Hospital Course (Addendum)
11/23: Having diarrhea this morning Scared of going home bc of vomiting Having some abdominal tightness and couldn't eat breakfast (bc she's anxious about going home)  Plan: Potassium is low again today, giving more    #Hematemesis #Pancreatitis Patient developed nausea/vomiting with epigastric pain five days before admission and later developed hematemesis causing her to present to the hospital.  CT abdomen/pelvis was notable for mild peripancreatic fat stranding concerning for pancreatitis.  The patient also had a lipase of 447.  She was given the diagnosis of pancreatitis.  Right upper quadrant ultrasound was obtained, which showed no evidence of common bile duct dilation.  It was thought that her pancreatitis was secondary to her heavy alcohol use.  It was thought that her hematemesis was the result of Mallory-Weiss tears from her vomiting.  An EGD was performed showing mild gastritis, but no notable source for upper GI bleed.  She was fluid resuscitated with several liters of lactated Ringer's and maintained on maintenance fluids thereafter. She exhibited gradual improvement. Her diet was advanced gradually and before discharge she was able to tolerate a regular diet.   #Acid-base disturbance #AGMA Bicarb <7 on admission, AG of 33.  After fluid resuscitation and 48 hours of maintenance IV fluids, the patient's bicarbonate normalized.   #Alcohol use disorder #Benzo dependence Patient with regular scripts for Klonopin 0.5 mg BID PRN and a history of alcohol use disorder.  She was maintained on a CIWA protocol with as needed Ativan.  She had 1 CIWA score of 16, for which she received 3 mg of Ativan.  Otherwise her CIWA scores were less than 8.  #Transaminitis AST/ALT of 212/77 on admission, has been elevated on previous CMPs in previous admissions. Diagnosis of alcoholic hepatitis. CT A/P notable for severe fatty infiltration of liver.    #Tubulovillous adenoma On chart review, patient had  a colonoscopy in May 2022, which was notable for several tubular adenomas that were resected. There was a large polypoid mass-like lesion that was biopsied and demonstrated tubulovillous adenoma without high-grade dysplasia or malignancy. She was referred to colorectal surgery for resection, but this was never performed due to patient concerns regarding a potential colostomy.  During this hospitalization.   #AKI (resolved)

## 2021-03-13 NOTE — Consult Note (Addendum)
Consultation  Referring Provider:   Dr. Alvino Chapel Primary Care Physician:  Patient, No Pcp Per (Inactive) Primary Gastroenterologist: Dr. Bryan Lemma       Reason for Consultation:   Hematemesis and epigastric pain         HPI:   Debra Jackson is a 65 y.o. female with a past medical history as listed below including anxiety, depression and polysubstance abuse, who presented to the ER today with an episode of hematemesis and epigastric pain.    Today, patient tells me that she has been vomiting in the mornings when she wakes up for the past 4 to 5 days, but then will seem to feel somewhat better throughout the rest of the day, this morning when she woke up she started vomiting even more, at least 20 times since she arrived in the ER and the last about 25 minutes ago all with copious amounts of black/brown material and even some bright red blood.  Tells me this vomiting has been very "forceful", and even "makes me pee" when she has to vomit.  Along with this has developed a new epigastric pain which she describes as a "tightness" rated as a 6-10/2008 as of this morning.  Explained that the medicine she received in the ER helped initially but she is starting to feel the nausea come back.  Also describes some muscle spasms in her back.  Also reports a black stool off and on for many years, nothing recently.    Per patient she did follow with colorectal surgery in regards to large polyp found at time of colonoscopy, but they wanted to have her follow with a PCP (she does not have 1) and get approved for surgery.  She is since then not followed up.    Does continue to drink significant amounts of alcohol, her husband reported at least a quart of vodka daily.  She tells me 2-3 beers per day for years.  Of note, was admitted for a suicide attempt 02/07/2021.    Denies fever, chills, NSAID use or weight loss.     ED course: CT AP with mild stranding in the peripancreatic fat suggestive of mild  pancreatitis, persistent abnormal soft tissue density in the sigmoid colon as seen on prior study 06/02/2020, severe fatty infiltration of the liver, nonobstructing left upper pole renal stone, moderate size hiatal hernia, labs with creatinine elevated at 1.46, BUN normal at 16, AST 212, ALT 77, lipase 447, total bilirubin 2.0, MCV elevated 50.4, hemoglobin 15.2, white count 12.9     GI history: 09/17/2020 colonoscopy with Dr. Bryan Lemma: Findings: - One 15 mm polyp in the ascending colon, removed with a cold snare. Resected and retrieved. - One 12 mm polyp in the descending colon, removed with a hot snare. Resected and retrieved. - Partially obstructing tumor in the sigmoid colon. Biopsied. Tattooed. - One 18 mm polyp in the sigmoid colon, removed with a hot snare. Resected and retrieved. - Two 3 to 5 mm polyps in the rectum, removed with a cold snare. Resected and retrieved. - Non-bleeding internal hemorrhoids. - There was significant looping of the colon. Plan:  Check CT/CEA and referred to colorectal surgery Pathology showed several tubular adenomas and the large polypoid masslike lesion was biopsied and demonstrated tubulovillous adenoma without high-grade dysplasia or malignancy, CEA was normal, she had already had a referral to Dr. Dema Severin in [place] for surgical resection, repeat colonoscopy recommended in a year  08/06/2020 office visit for follow-up after ER below:  At that time presented with her husband, had been having some diarrhea with nausea and vomiting but not for at least a month, chronic right lower quadrant pain; plan: Scheduled for colonoscopy  06/02/2020 ER visit: For anxiety, shaking, nausea and vomiting, had been without her Klonopin for 5 days, at that time reported that she drank alcohol every day with elevated LFTs AST 125 and ALT 255, CT of the abdomen pelvis showed soft tissue prominence noted in the proximal descending colon without appreciable associated wall thickening, slight  narrowing of the sigmoid colon immediately distal to the area  Past Medical History:  Diagnosis Date  . Anxiety   . Depression   . GERD (gastroesophageal reflux disease)   . Opiate abuse, continuous ([place])   . Polysubstance abuse [facility])     Past Surgical History:  Procedure Laterality Date  . TONSILLECTOMY      Family History  Adopted: Yes    Social History   Tobacco Use  . Smoking status: Former    Types: Cigarettes  . Smokeless tobacco: Never  Vaping Use  . Vaping Use: Never used  Substance Use Topics  . Alcohol use: Yes    Alcohol/week: 2.0 - 4.0 standard drinks    Types: 2 - 4 Glasses of wine per week    Comment: daily  . Drug use: Not Currently    Types: Cocaine    Comment: heroin    Prior to Admission medications   Medication Sig Start Date End Date Taking? Authorizing Provider  citalopram (CELEXA) 20 MG tablet Take 20 mg by mouth daily.    [provider]  clonazePAM (KLONOPIN) 0.5 MG tablet Take 1 mg by mouth 2 (two) times daily as needed for anxiety.    [provider]  ibuprofen (ADVIL) 400 MG tablet Take 400 mg by mouth every 6 (six) hours as needed for moderate pain, headache or cramping.    [provider]  QUEtiapine (SEROQUEL) 50 MG tablet Take 50 mg by mouth at bedtime. 06/28/20   [provider]  REXULTI 0.5 MG TABS Take 0.5 mg by mouth daily. 07/28/20   [provider]  VYVANSE 30 MG capsule Take 30 mg by mouth every morning. 08/29/20   [provider]    Current Facility-Administered Medications  Medication Dose Route Frequency Provider Last Rate Last Admin  . lactated ringers bolus 1,000 mL  1,000 mL Intravenous Once Evlyn Courier, PA-C       Current Outpatient Medications  Medication Sig Dispense Refill  . citalopram (CELEXA) 20 MG tablet Take 20 mg by mouth daily.    . clonazePAM (KLONOPIN) 0.5 MG tablet Take 1 mg by mouth 2 (two) times daily as needed for anxiety.    Marland Kitchen ibuprofen (ADVIL) 400 MG  tablet Take 400 mg by mouth every 6 (six) hours as needed for moderate pain, headache or cramping.    Marland Kitchen QUEtiapine (SEROQUEL) 50 MG tablet Take 50 mg by mouth at bedtime.    Marland Kitchen REXULTI 0.5 MG TABS Take 0.5 mg by mouth daily.    Marland Kitchen VYVANSE 30 MG capsule Take 30 mg by mouth every morning.      Allergies as of 03/13/2021 - Review Complete 03/13/2021  Allergen Reaction Noted  . Penicillins Nausea And Vomiting 09/01/2012     Review of Systems:    Constitutional: No weight loss, fever or chills Skin: No rash  Cardiovascular: No chest pain Respiratory: No SOB  Gastrointestinal: See HPI and otherwise negative Genitourinary: No dysuria  Neurological: No headache, dizziness or syncope Musculoskeletal: No new muscle or joint pain Hematologic: No bruising Psychiatric:+depression and anxiety   Physical Exam:  Vital signs in last 24 hours: Temp:  [97.3 F (36.3 C)-98 F (36.7 C)] 97.3 F (36.3 C) (11/18 1537) Pulse Rate:  [106-122] 106 (11/18 1537) Resp:  [22-28] 28 (11/18 1537) BP: (131-158)/(60-81) 158/79 (11/18 1537) SpO2:  [95 %-97 %] 96 % (11/18 1537)   General:   Pleasant chronically ill-appearing Caucasian female appears to be in NAD, Well developed, Well nourished, alert and cooperative Head:  Normocephalic and atraumatic. Eyes:   PEERL, EOMI. No icterus. Conjunctiva pink. Ears:  Normal auditory acuity. Neck:  Supple Throat: Oral cavity and pharynx without inflammation, swelling or lesion. + Dried blood on lips and teeth, black in color Lungs: Respirations even and unlabored. Lungs clear to auscultation bilaterally.   No wheezes, crackles, or rhonchi.  Heart: Normal S1, S2. No MRG. Regular rate and rhythm. No peripheral edema, cyanosis or pallor.  Abdomen:  Soft, nondistended, moderate epigastric TTP with some involuntary guarding.  Normal bowel sounds. No appreciable masses or hepatomegaly. Rectal:  Not performed.  Msk:  Symmetrical without gross deformities. Peripheral pulses  intact.  Extremities:  Without edema, no deformity or joint abnormality.  Neurologic:  Alert and  oriented x4;  grossly normal neurologically.  Skin:   Dry and intact without significant lesions or rashes. Psychiatric: Demonstrates good judgement and reason without abnormal affect or behaviors.   LAB RESULTS: Recent Labs    03/13/21 1008  WBC 12.9*  HGB 15.2*  HCT 50.4*  PLT 156   BMET Recent Labs    03/13/21 1008  NA 141  K 4.6  CL 102  CO2 <7*  GLUCOSE 113*  BUN 16  CREATININE 1.46*  CALCIUM 8.5*   Hepatic Function Latest Ref Rng & Units 03/13/2021 02/07/2021 06/02/2020  Total Protein 6.5 - 8.1 g/dL 8.3(H) 7.2 8.1  Albumin 3.5 - 5.0 g/dL 4.2 3.7 4.6  AST 15 - 41 U/L 212(H) 64(H) 125(H)  ALT 0 - 44 U/L 77(H) 37 55(H)  Alk Phosphatase 38 - 126 U/L 105 79 69  Total Bilirubin 0.3 - 1.2 mg/dL 2.0(H) 0.6 1.2    STUDIES: DG Chest 2 View  Result Date: 03/13/2021 CLINICAL DATA:  Chest pain, vomiting EXAM: CHEST - 2 VIEW COMPARISON:  03/20/2019 FINDINGS: Cardiac size is within normal limits. There is soft tissue fullness in the retrocardiac region suggesting fixed hiatal hernia. Right hemidiaphragm is elevated. There are no signs of pulmonary edema or focal pulmonary consolidation. There is no pleural effusion or pneumothorax. IMPRESSION: There are no signs of alveolar pulmonary edema or focal pulmonary consolidation. There is soft tissue fullness in the retrocardiac region suggesting moderate sized fixed hiatal hernia. Elevation of right hemidiaphragm may be due to eventration or paralysis. Electronically Signed   By: Elmer Picker M.D.   On: 03/13/2021 10:56   CT ABDOMEN PELVIS W CONTRAST  Result Date: 03/13/2021 CLINICAL DATA:  Epigastric pain, hematemesis EXAM: CT ABDOMEN AND PELVIS WITH CONTRAST TECHNIQUE: Multidetector CT imaging of the abdomen and pelvis was performed using the standard protocol following bolus administration of intravenous contrast. CONTRAST:  51m  OMNIPAQUE IOHEXOL 300 MG/ML  SOLN COMPARISON:  CT abdomen/pelvis 06/02/2020 FINDINGS: Lower chest: The lung bases are clear. Hepatobiliary: The liver is markedly hypoattenuating consistent with severe fatty infiltration. There are no focal lesions. The gallbladder is unremarkable. There is no biliary ductal dilatation. Pancreas: There is mild stranding in the fat  surrounding the pancreas. The pancreatic parenchyma enhances normally. There are no focal lesions or contour abnormalities. There is no main pancreatic ductal dilatation. Spleen: Unremarkable. Adrenals/Urinary Tract: The adrenals are unremarkable. There is a 4 mm nonobstructing left upper pole renal stone. There are no focal renal lesions. There is no hydronephrosis or hydroureter. The bladder is decompressed but grossly unremarkable. Stomach/Bowel: There is a moderate size hiatal hernia. The stomach is otherwise unremarkable. There is no evidence of bowel obstruction. The colon is nearly entirely decompressed, but there is an approximately 5.4 cm by 4.0 cm soft tissue density lesion in the sigmoid colon in a similar location to the previously described soft tissue density on the study of 06/02/2020. There is no evidence of obstruction due to this lesion. Otherwise, there is no abnormal bowel wall thickening or inflammatory change. Vascular/Lymphatic: There is minimal calcified atherosclerotic plaque in the nonaneurysmal abdominal aorta. The major branch vessels are patent. The main portal and splenic veins are patent. There is no abdominal or pelvic lymphadenopathy. Reproductive: The uterus and adnexa are unremarkable. Other: There is no ascites or free air. Musculoskeletal: There is a remote fracture of the right inferior pubic ramus. There is unchanged mild compression deformity of the L1 vertebral body and advanced degenerative change at L4-L5. There is no acute osseous abnormality or aggressive osseous lesion. IMPRESSION: 1. Mild stranding in the  peripancreatic fat is suggestive of mild pancreatitis, given elevated lipase. There is no evidence of necrosis. 2. Persistent abnormal soft tissue density in the sigmoid colon, as seen on the prior study from 06/02/2020. Recommend colonoscopy to evaluate for neoplasm. 3. Severe fatty infiltration of the liver. 4. Nonobstructing left upper pole renal stone. 5. Moderate size hiatal hernia. Electronically Signed   By: Valetta Mole M.D.   On: 03/13/2021 14:30     Impression / Plan:   Impression: 1.  Pancreatitis: With nausea, vomiting and epigastric pain, lipase elevated in the 400s, history of alcohol abuse which is likely the cause 2.  Hematemesis: With above, started this morning with 20+ episodes; likely related to alcoholic pancreatitis and possibly Mallory-Weiss tear +/- esophagitis +/- gastritis 3.  Tubulovillous adenoma: Never removed after time of last colonoscopy in May even though patient was referred to colorectal surgery at the time  4.  Alcohol abuse: At least 3 beers a day for years, patient's husband reports a quart of vodka daily which she does not tell me at time of interview 5.  Fatty liver: Likely related to alcohol abuse; now with signs of likely acute alcoholic hepatitis  Plan: 1.  Recommend CIWA protocol 2.  Aggressive fluid resuscitation given pancreatitis 3.  Patient to remain n.p.o. now with sips and chips and strict n.p.o. at [facility] plans for EGD tomorrow to evaluate hematemesis.  Did discuss risks, benefits, limitations and alternatives and patient agrees to proceed.  This is scheduled with Dr. Ardis Hughs. 4.  Start Pantoprazole 40 mg IV twice daily 5.  Continue as needed analgesics and antiemetics 6.  Could consider passed by with the surgical team while she is in the hospital so they can further discuss removal of this tubulovillous adenoma 7.  Abstain from alcohol.  Discussed this with the patient today.  She tells me "I did not know it was such a problem" 8. Ordered  COVID testing to be completed this evening.  Also ordered repeat CBC/CMP for in the morning as well as hemoglobin every 12 for now unless she starts showing further signs of bleeding, at which  time this may need to be increased to every 6-8 hours.  Thank you for your kind consultation, we will continue to follow.  Lavone Nian [facility]  03/13/2021, 4:04 PM   ________________________________________________________________________  Velora Heckler GI MD note:  I personally examined the patient, reviewed the data and agree with the assessment and plan described above. She has pancreatitis and etoh related hepatitis.  Does have gallstones in her GB and so perhaps the AP is gallstone related.  Hard to know for certain. I am planning EGD today to evaluate her hematemesis, note that HB is still normal.  We discussed her sigmoid colon mass and that she needs to have it removed surgically. She has met with a surgeon months ago, tells me she needs to see a PCP to clear her for surgery. Would offer any services available here during this admission to facilitate that so that she can have her surgery in the near future.  Owens Loffler, MD [facility] Pager [phone]

## 2021-03-14 ENCOUNTER — Other Ambulatory Visit: Payer: Self-pay

## 2021-03-14 ENCOUNTER — Inpatient Hospital Stay (HOSPITAL_COMMUNITY): Payer: Medicare Other | Admitting: Certified Registered Nurse Anesthetist

## 2021-03-14 ENCOUNTER — Encounter (HOSPITAL_COMMUNITY): Payer: Self-pay | Admitting: Internal Medicine

## 2021-03-14 ENCOUNTER — Encounter (HOSPITAL_COMMUNITY)
Admission: EM | Disposition: A | Discharge status: Home / Self Care | Payer: Self-pay | Source: Home / Self Care | Attending: Internal Medicine

## 2021-03-14 DIAGNOSIS — K920 Hematemesis: Secondary | ICD-10-CM

## 2021-03-14 DIAGNOSIS — K859 Acute pancreatitis without necrosis or infection, unspecified: Secondary | ICD-10-CM

## 2021-03-14 DIAGNOSIS — K2970 Gastritis, unspecified, without bleeding: Secondary | ICD-10-CM

## 2021-03-14 DIAGNOSIS — K852 Alcohol induced acute pancreatitis without necrosis or infection: Secondary | ICD-10-CM | POA: Diagnosis not present

## 2021-03-14 HISTORY — PX: BIOPSY: SHX5522

## 2021-03-14 HISTORY — PX: ESOPHAGOGASTRODUODENOSCOPY (EGD) WITH PROPOFOL: SHX5813

## 2021-03-14 LAB — COMPREHENSIVE METABOLIC PANEL
ALT: 60 U/L — ABNORMAL HIGH (ref 0–44)
AST: 176 U/L — ABNORMAL HIGH (ref 15–41)
Albumin: 3.4 g/dL — ABNORMAL LOW (ref 3.5–5.0)
Alkaline Phosphatase: 78 U/L (ref 38–126)
Anion gap: 15 (ref 5–15)
BUN: 22 mg/dL (ref 8–23)
CO2: 17 mmol/L — ABNORMAL LOW (ref 22–32)
Calcium: 8.3 mg/dL — ABNORMAL LOW (ref 8.9–10.3)
Chloride: 109 mmol/L (ref 98–111)
Creatinine, Ser: 1.4 mg/dL — ABNORMAL HIGH (ref 0.44–1.00)
GFR, Estimated: 41 mL/min — ABNORMAL LOW (ref >60)
Glucose, Bld: 186 mg/dL — ABNORMAL HIGH (ref 70–99)
Potassium: 3.5 mmol/L (ref 3.5–5.1)
Sodium: 141 mmol/L (ref 135–145)
Total Bilirubin: 1.8 mg/dL — ABNORMAL HIGH (ref 0.3–1.2)
Total Protein: 6.4 g/dL — ABNORMAL LOW (ref 6.5–8.1)

## 2021-03-14 LAB — CBG MONITORING, ED: Glucose-Capillary: 200 mg/dL — ABNORMAL HIGH (ref 70–99)

## 2021-03-14 LAB — HEPATITIS PANEL, ACUTE
HCV Ab: NONREACTIVE
Hep A IgM: NONREACTIVE
Hep B C IgM: NONREACTIVE
Hepatitis B Surface Ag: NONREACTIVE

## 2021-03-14 LAB — GLUCOSE, CAPILLARY
Glucose-Capillary: 134 mg/dL — ABNORMAL HIGH (ref 70–99)
Glucose-Capillary: 151 mg/dL — ABNORMAL HIGH (ref 70–99)

## 2021-03-14 LAB — PHOSPHORUS: Phosphorus: 1.2 mg/dL — ABNORMAL LOW (ref 2.5–4.6)

## 2021-03-14 LAB — BETA-HYDROXYBUTYRIC ACID: Beta-Hydroxybutyric Acid: 6.57 mmol/L — ABNORMAL HIGH (ref 0.05–0.27)

## 2021-03-14 LAB — MAGNESIUM: Magnesium: 1.9 mg/dL (ref 1.7–2.4)

## 2021-03-14 SURGERY — ESOPHAGOGASTRODUODENOSCOPY (EGD) WITH PROPOFOL
Anesthesia: General

## 2021-03-14 MED ORDER — FENTANYL CITRATE (PF) 250 MCG/5ML IJ SOLN
INTRAMUSCULAR | Status: DC | PRN
  Administered 2021-03-14: 50 ug via INTRAVENOUS

## 2021-03-14 MED ORDER — LACTATED RINGERS IV SOLN
INTRAVENOUS | Status: AC | PRN
  Administered 2021-03-14: 20 mL/h via INTRAVENOUS

## 2021-03-14 MED ORDER — DEXAMETHASONE SODIUM PHOSPHATE 10 MG/ML IJ SOLN
INTRAMUSCULAR | Status: DC | PRN
  Administered 2021-03-14: 10 mg via INTRAVENOUS

## 2021-03-14 MED ORDER — THIAMINE HCL 100 MG/ML IJ SOLN
100.0000 mg | Freq: Once | INTRAMUSCULAR | Status: AC
  Administered 2021-03-14: 100 mg via INTRAVENOUS
  Filled 2021-03-14: qty 2

## 2021-03-14 MED ORDER — SUCCINYLCHOLINE CHLORIDE 200 MG/10ML IV SOSY
PREFILLED_SYRINGE | INTRAVENOUS | Status: DC | PRN
  Administered 2021-03-14: 140 mg via INTRAVENOUS

## 2021-03-14 MED ORDER — LIDOCAINE 2% (20 MG/ML) 5 ML SYRINGE
INTRAMUSCULAR | Status: DC | PRN
  Administered 2021-03-14: 40 mg via INTRAVENOUS

## 2021-03-14 MED ORDER — DEXTROSE-NACL 5-0.45 % IV SOLN: INTRAVENOUS | Status: DC

## 2021-03-14 MED ORDER — SODIUM CHLORIDE 0.9 % IV SOLN: INTRAVENOUS | Status: DC

## 2021-03-14 MED ORDER — PROPOFOL 10 MG/ML IV BOLUS
INTRAVENOUS | Status: DC | PRN
  Administered 2021-03-14: 15 mg via INTRAVENOUS

## 2021-03-14 MED ORDER — MIDAZOLAM HCL 2 MG/2ML IJ SOLN
INTRAMUSCULAR | Status: AC
  Filled 2021-03-14: qty 2

## 2021-03-14 MED ORDER — MIDAZOLAM HCL 2 MG/2ML IJ SOLN
INTRAMUSCULAR | Status: DC | PRN
  Administered 2021-03-14: 2 mg via INTRAVENOUS

## 2021-03-14 MED ORDER — ONDANSETRON HCL 4 MG/2ML IJ SOLN
INTRAMUSCULAR | Status: DC | PRN
  Administered 2021-03-14: 4 mg via INTRAVENOUS

## 2021-03-14 MED ORDER — FENTANYL CITRATE (PF) 250 MCG/5ML IJ SOLN
INTRAMUSCULAR | Status: AC
  Filled 2021-03-14: qty 5

## 2021-03-14 MED ORDER — POTASSIUM PHOSPHATES 15 MMOLE/5ML IV SOLN
40.0000 mmol | Freq: Once | INTRAVENOUS | Status: AC
  Administered 2021-03-15: 40 mmol via INTRAVENOUS
  Filled 2021-03-14: qty 13.33

## 2021-03-14 MED ORDER — ONDANSETRON HCL 4 MG/2ML IJ SOLN
4.0000 mg | Freq: Three times a day (TID) | INTRAMUSCULAR | Status: DC
  Administered 2021-03-14 – 2021-03-16 (×6): 4 mg via INTRAVENOUS
  Filled 2021-03-14 (×6): qty 2

## 2021-03-14 SURGICAL SUPPLY — 15 items

## 2021-03-14 NOTE — Plan of Care (Signed)

## 2021-03-14 NOTE — Progress Notes (Signed)
  Date: 03/14/2021  Patient name: Debra Jackson  Medical record number: [id number]  Date of birth: [birth year]   I have seen and evaluated Debra Jackson and discussed their care with the Residency Team. Briefly, Debra Jackson is a 65 year old woman with PMH of GERD, depression, anxiety, AUD who presented with abdominal pain, hematemesis, N/V.  She also has a history of untreated sigmoid mass from May of this year.  She has further had poor PO intake and dark stools.  This morning, she is more calm on CIWA protocol.  She has a history of daily ETOH use.   Vitals:   03/14/21 0600 03/14/21 0700  BP: (!) 136/92 124/81  Pulse: (!) 104 (!) 102  Resp: (!) 30 (!) 26  Temp:    SpO2: 91% 94%   Gen: Lying in bed, alert, calm Eyes: Anicteric sclerae, no conjunctival injection HENT: Neck is supple, mildly dry MM CV: Tachycardic, regular, no murmur noted, no peripheral edema Pulm: Breathing comfortably on room air, no wheezing Abd: Distended but soft, non tender, bowel sounds decreased MSK: Thin extremities but normal tone Skin: She has pale skin, no spider angiomas noted.  Psych: Flat affect, alert, calm.   RUQ [place] as read by Debra Jackson IMPRESSION: 1. Cholelithiasis without cholecystitis. 2. Hepatic steatosis.  CT abdomen as read by Debra Jackson IMPRESSION: 1. Mild stranding in the peripancreatic fat is suggestive of mild pancreatitis, given elevated lipase. There is no evidence of necrosis. 2. Persistent abnormal soft tissue density in the sigmoid colon, as seen on the prior study from 06/02/2020. Recommend colonoscopy to evaluate for neoplasm. 3. Severe fatty infiltration of the liver. 4. Nonobstructing left upper pole renal stone. 5. Moderate size hiatal hernia.  Assessment and Plan: I have seen and evaluated the patient as outlined above. I agree with the formulated Assessment and Plan as detailed in the residents' note, with the following changes:   1. Acute pancreatitis - This is  the likely cause for the vomiting and abdominal pain - NPO, fluids, IV protonix - GI consulted and will do EGD to evaluate for varices  - Trend lactate - Dilaudid for pain - RUQ ultrasound as noted above  2. AUD with withdrawal - Patient with agitation and elevated CIWA - Continue CIWA protocol with ativan  3. Sigmoid mass - After evaluation of nausea, vomiting, hematemesis, will need follow up for this; repeat colonoscopy vs. Surgical evaluation.   4. Anion gap metabolic acidosis, AKI, ketosis - Mix of lactic acidosis and starvation ketosis - Improving with fluids - Continue fluid resuscitation and monitor for worsening respiratory or mental status  Patient is very sick, she is making improvements with current interventions.  Plan for EGD today with GI.  Other issues will be addressed in Debra Jackson daily note.   Debra Falcon, MD 11/19/20228:02 AM

## 2021-03-14 NOTE — Op Note (Signed)
[facility] Patient Name: Debra Jackson Procedure Date : 03/14/2021 MRN: [id number] Attending MD: Milus Banister , MD Date of Birth: November 27, 1955 CSN: [id number] Age: 65 Admit Type: Inpatient Procedure:                Upper GI endoscopy Indications:              Coffee-ground emesis, Hematemesis in setting of                            mild acute pancreatitis and hepatitis (both etoh                            related likely) Providers:                Milus Banister, MD, Grace Isaac, RN, Tyna Jaksch Technician Referring MD:              Medicines:                Monitored Anesthesia Care Complications:            No immediate complications. Estimated blood loss:                            None. Estimated Blood Loss:     Estimated blood loss: none. Procedure:                Pre-Anesthesia Assessment:                           - Prior to the procedure, a History and Physical                            was performed, and patient medications and                            allergies were reviewed. The patient's tolerance of                            previous anesthesia was also reviewed. The risks                            and benefits of the procedure and the sedation                            options and risks were discussed with the patient.                            All questions were answered, and informed consent                            was obtained. Prior Anticoagulants: The patient has                            taken no previous  anticoagulant or antiplatelet                            agents. ASA Grade Assessment: III - A patient with                            severe systemic disease. After reviewing the risks                            and benefits, the patient was deemed in                            satisfactory condition to undergo the procedure.                           After obtaining informed consent, the endoscope was                             passed under direct vision. Throughout the                            procedure, the patient's blood pressure, pulse, and                            oxygen saturations were monitored continuously. The                            [id number]-[id number] ([id number] Olympus endoscope was introduced                            through the mouth, and advanced to the second part                            of duodenum. The upper GI endoscopy was                            accomplished without difficulty. The patient                            tolerated the procedure well. Scope In: Scope Out: Findings:      No blood in the UGI tract.      A medium-sized hiatal hernia was present with typical resultant       esophageal tortuosity and foreshortening. No obvious Cameron's erosions.      Minimal inflammation characterized by erythema, friability and       granularity was found in the entire examined stomach. Biopsies were       taken with a cold forceps for histology.      The exam was otherwise without abnormality. Impression:               - Medium-sized hiatal hernia. No obvious Cameron's                            erosions                           -  Mild gastritis, biopsied to check for H. pylori                           - The examination was otherwise normal. Recommendation:           - Scheduled antiemetics.                           - If biopsies are positive for H. pylori will start                            appropriate antitibiotics.                           - SIps water only for now.                           - Will follow along. Procedure Code(s):        --- Professional ---                           [phone], Esophagogastroduodenoscopy, flexible,                            transoral; with biopsy, single or multiple Diagnosis Code(s):        --- Professional ---                           K44.9, Diaphragmatic hernia without obstruction or                             gangrene                           K29.70, Gastritis, unspecified, without bleeding                           K92.0, Hematemesis CPT copyright 2019 [employer]. All rights reserved. The codes documented in this report are preliminary and upon coder review may  be revised to meet current compliance requirements. Milus Banister, MD 03/14/2021 12:59:13 PM This report has been signed electronically. Number of Addenda: 0

## 2021-03-14 NOTE — Progress Notes (Signed)
HD#1 Subjective:  Overnight Events: no event  Patient was seen at bedside with family member.  She appears much more comfortable in no acute distress.  She is not actively vomiting.  She states that her abdominal pain has improved as well as her nausea and vomiting.  She has had a BM this morning.  Objective:  Vital signs in last 24 hours: Vitals:   03/14/21 0809 03/14/21 0830 03/14/21 0900 03/14/21 0930  BP:  (!) 156/85 137/88 (!) 145/92  Pulse:  (!) 103 (!) 103 (!) 102  Resp:  (!) 25 (!) 27 (!) 24  Temp: 98.7 F (37.1 C)     TempSrc: Oral     SpO2:  95% 93% 92%   Supplemental O2: Room Air SpO2: 92 %   Physical Exam:  Physical Exam Constitutional:      General: She is not in acute distress. HENT:     Head: Normocephalic.  Eyes:     General:        Right eye: No discharge.        Left eye: No discharge.     Conjunctiva/sclera: Conjunctivae normal.  Cardiovascular:     Rate and Rhythm: Regular rhythm. Tachycardia present.  Pulmonary:     Effort: Pulmonary effort is normal. No respiratory distress.  Abdominal:     General: Bowel sounds are normal.     Tenderness: There is abdominal tenderness (Tenderness to palpation in the epigastric region).  Skin:    General: Skin is warm.  Neurological:     General: No focal deficit present.     Mental Status: She is alert and oriented to person, place, and time.    There were no vitals filed for this visit.   Intake/Output Summary (Last 24 hours) at 03/14/2021 0959 Last data filed at 03/14/2021 0200 Gross per 24 hour  Intake 5678.14 ml  Output --  Net 5678.14 ml   Net IO Since Admission: 5,678.14 mL [03/14/21 0959]  Pertinent Labs: CBC Latest Ref Rng & Units 03/14/2021 03/13/2021 03/13/2021  WBC 4.0 - 10.5 K/uL 9.0 - -  Hemoglobin 12.0 - 15.0 g/dL 13.4 15.0 13.7  Hematocrit 36.0 - 46.0 % 39.9 44.0 -  Platelets 150 - 400 K/uL 106(L) - -    CMP Latest Ref Rng & Units 03/14/2021 03/13/2021 03/13/2021  Glucose  70 - 99 mg/dL 186(H) 176(H) -  BUN 8 - 23 mg/dL 22 22 -  Creatinine 0.44 - 1.00 mg/dL 1.40(H) 1.76(H) -  Sodium 135 - 145 mmol/L 141 140 141  Potassium 3.5 - 5.1 mmol/L 3.5 3.8 4.0  Chloride 98 - 111 mmol/L 109 107 -  CO2 22 - 32 mmol/L 17(L) 11(L) -  Calcium 8.9 - 10.3 mg/dL 8.3(L) 8.0(L) -  Total Protein 6.5 - 8.1 g/dL 6.4(L) - -  Total Bilirubin 0.3 - 1.2 mg/dL 1.8(H) - -  Alkaline Phos 38 - 126 U/L 78 - -  AST 15 - 41 U/L 176(H) - -  ALT 0 - 44 U/L 60(H) - -    Imaging: DG Chest 2 View  Result Date: 03/13/2021 CLINICAL DATA:  Chest pain, vomiting EXAM: CHEST - 2 VIEW COMPARISON:  03/20/2019 FINDINGS: Cardiac size is within normal limits. There is soft tissue fullness in the retrocardiac region suggesting fixed hiatal hernia. Right hemidiaphragm is elevated. There are no signs of pulmonary edema or focal pulmonary consolidation. There is no pleural effusion or pneumothorax. IMPRESSION: There are no signs of alveolar pulmonary edema or focal pulmonary  consolidation. There is soft tissue fullness in the retrocardiac region suggesting moderate sized fixed hiatal hernia. Elevation of right hemidiaphragm may be due to eventration or paralysis. Electronically Signed   By: Elmer Picker M.D.   On: 03/13/2021 10:56   CT ABDOMEN PELVIS W CONTRAST  Result Date: 03/13/2021 CLINICAL DATA:  Epigastric pain, hematemesis EXAM: CT ABDOMEN AND PELVIS WITH CONTRAST TECHNIQUE: Multidetector CT imaging of the abdomen and pelvis was performed using the standard protocol following bolus administration of intravenous contrast. CONTRAST:  109mL OMNIPAQUE IOHEXOL 300 MG/ML  SOLN COMPARISON:  CT abdomen/pelvis 06/02/2020 FINDINGS: Lower chest: The lung bases are clear. Hepatobiliary: The liver is markedly hypoattenuating consistent with severe fatty infiltration. There are no focal lesions. The gallbladder is unremarkable. There is no biliary ductal dilatation. Pancreas: There is mild stranding in the fat  surrounding the pancreas. The pancreatic parenchyma enhances normally. There are no focal lesions or contour abnormalities. There is no main pancreatic ductal dilatation. Spleen: Unremarkable. Adrenals/Urinary Tract: The adrenals are unremarkable. There is a 4 mm nonobstructing left upper pole renal stone. There are no focal renal lesions. There is no hydronephrosis or hydroureter. The bladder is decompressed but grossly unremarkable. Stomach/Bowel: There is a moderate size hiatal hernia. The stomach is otherwise unremarkable. There is no evidence of bowel obstruction. The colon is nearly entirely decompressed, but there is an approximately 5.4 cm by 4.0 cm soft tissue density lesion in the sigmoid colon in a similar location to the previously described soft tissue density on the study of 06/02/2020. There is no evidence of obstruction due to this lesion. Otherwise, there is no abnormal bowel wall thickening or inflammatory change. Vascular/Lymphatic: There is minimal calcified atherosclerotic plaque in the nonaneurysmal abdominal aorta. The major branch vessels are patent. The main portal and splenic veins are patent. There is no abdominal or pelvic lymphadenopathy. Reproductive: The uterus and adnexa are unremarkable. Other: There is no ascites or free air. Musculoskeletal: There is a remote fracture of the right inferior pubic ramus. There is unchanged mild compression deformity of the L1 vertebral body and advanced degenerative change at L4-L5. There is no acute osseous abnormality or aggressive osseous lesion. IMPRESSION: 1. Mild stranding in the peripancreatic fat is suggestive of mild pancreatitis, given elevated lipase. There is no evidence of necrosis. 2. Persistent abnormal soft tissue density in the sigmoid colon, as seen on the prior study from 06/02/2020. Recommend colonoscopy to evaluate for neoplasm. 3. Severe fatty infiltration of the liver. 4. Nonobstructing left upper pole renal stone. 5. Moderate  size hiatal hernia. Electronically Signed   By: Valetta Mole M.D.   On: 03/13/2021 14:30   US Abdomen Limited RUQ (LIVER/GB)  Result Date: 03/13/2021 CLINICAL DATA:  Acute pancreatitis, epigastric pain EXAM: ULTRASOUND ABDOMEN LIMITED RIGHT UPPER QUADRANT COMPARISON:  03/13/2021 FINDINGS: Gallbladder: Shadowing 1.5 cm gallstone dependently within the gallbladder. No evidence of gallbladder wall thickening or pericholecystic fluid. Negative sonographic Murphy sign. Common bile duct: Diameter: 5 mm Liver: Diffuse increased liver echotexture consistent with hepatic steatosis. Portal vein is patent on color Doppler imaging with normal direction of blood flow towards the liver. Other: Evaluation limited due to patient body habitus. IMPRESSION: 1. Cholelithiasis without cholecystitis. 2. Hepatic steatosis. Electronically Signed   By: Randa Ngo M.D.   On: 03/13/2021 19:36    Assessment/Plan:   Principal Problem:   Acute pancreatitis Active Problems:   Pancreatitis   Patient Summary: Debra Jackson is a 65 year old female with a PMHx of GERD, depression,  anxiety, opiate use disorder, and polysubstance use, presenting to the ED with hematemesis, found to have pancreatitis.  #Hematemesis #Pancreatitis Pancreatitis likely due to alcohol use disorder.  RUQ ultrasound showed gallstones but no CBD dilation.  Pain is well controlled with Dilaudid.  She currently n.p.o. Hematemesis in the setting of pancreatitis.  Differential including Mallory-Weiss tear vs gastritis/esophagitis vs esophageal varices.  Her hemoglobin is stable at 13.4 this morning.  Plan for EGD today. -Appreciate GI recommendations -N.p.o., IV fluids, pain control -PPI and octreotide -Zofran as needed for nausea/vomiting -Monitor for signs of pancreatic necrosis   #AGMA #AKI - likely prerenal Severe AGMA secondary to lactic acidosis and starvation ketosis.  Lactic acid trend down to 2 this morning.  Evidence of ketone in urine.   Acidosis improved status post 4 L of fluid.  Bicarb was 17 this morning anion gap of 15.   Creatinine also trending down from 1.76 to 1.4.  Her elevated serum osm and concentrated urine consistent with severe dehydration. -Continue D5/ 1/2-normal saline for hydration -Monitor mental status closely   #Alcohol use disorder #Benzo dependence Patient has heavy alcohol history per husband.  Her last drink was 5 days ago which put her at risk for alcohol withdrawal.  His CIWA score was 16 last night which she was given 3 mg of Ativan.  CIWA of 2 this morning. -Continue CIWA with Ativan -Thiamine folate supplement   #Transaminitis Likely alcoholic hepatitis.  No ascites on exam.  LFT trending down this morning with IV fluid.  Hepatitis panel came back negative. -Continue to follow with [facility]   # Known sigmoid tubulovillous adenoma Consider consult surgery when patient is more stable  Full code DVT: SCD Diet: NPO IVF: D5 1/2 NS  Gaylan Gerold, DO 03/14/2021, 9:59 AM Pager: [phone]  Please contact the on call pager after 5 pm and on weekends at [phone].

## 2021-03-14 NOTE — Anesthesia Procedure Notes (Signed)
Procedure Name: Intubation Date/Time: 03/14/2021 12:37 PM Performed by: Dorthea Cove, CRNA Pre-anesthesia Checklist: Patient identified, Emergency Drugs available, Suction available and Patient being monitored Patient Re-evaluated:Patient Re-evaluated prior to induction Oxygen Delivery Method: Circle system utilized Preoxygenation: Pre-oxygenation with 100% oxygen Induction Type: IV induction, Cricoid Pressure applied and Rapid sequence Laryngoscope Size: Mac and 3 Grade View: Grade I Tube type: Oral Tube size: 7.0 mm Number of attempts: 1 Airway Equipment and Method: Stylet and Oral airway Placement Confirmation: ETT inserted through vocal cords under direct vision, positive ETCO2 and breath sounds checked- equal and bilateral Secured at: 21 cm Tube secured with: Tape Dental Injury: Teeth and Oropharynx as per pre-operative assessment

## 2021-03-14 NOTE — Anesthesia Preprocedure Evaluation (Addendum)
Anesthesia Evaluation  Patient identified by MRN, date of birth, ID band Patient awake    Reviewed: Allergy & Precautions, NPO status , Patient's Chart, lab work & pertinent test results  Airway Mallampati: II  TM Distance: >3 FB Neck ROM: Full    Dental  (+) Teeth Intact, Dental Advisory Given   Pulmonary former smoker,    breath sounds clear to auscultation       Cardiovascular negative cardio ROS   Rhythm:Regular Rate:Normal     Neuro/Psych PSYCHIATRIC DISORDERS Anxiety Depression    GI/Hepatic GERD  ,(+)     substance abuse  alcohol use,   Endo/Other  negative endocrine ROS  Renal/GU negative Renal ROS     Musculoskeletal negative musculoskeletal ROS (+) narcotic dependent  Abdominal Normal abdominal exam  (+)   Peds  Hematology negative hematology ROS (+)   Anesthesia Other Findings   Reproductive/Obstetrics                            Anesthesia Physical Anesthesia Plan  ASA: 2  Anesthesia Plan: General   Post-op Pain Management:    Induction: Rapid sequence, Cricoid pressure planned and Intravenous  PONV Risk Score and Plan: 3 and Ondansetron, Midazolam and Dexamethasone  Airway Management Planned: Oral ETT  Additional Equipment: None  Intra-op Plan:   Post-operative Plan: Extubation in OR  Informed Consent: I have reviewed the patients History and Physical, chart, labs and discussed the procedure including the risks, benefits and alternatives for the proposed anesthesia with the patient or authorized representative who has indicated his/her understanding and acceptance.     Dental advisory given  Plan Discussed with: CRNA  Anesthesia Plan Comments: (Pt ok with receiving blood. )       Anesthesia Quick Evaluation

## 2021-03-14 NOTE — ED Notes (Signed)
Patient transported to Endo 

## 2021-03-14 NOTE — ED Notes (Signed)
Checked pt brief and linens. Pt is dry and linens are clean. Purewick replaced, pt boosted and repositioned. No further needs identified

## 2021-03-14 NOTE — Interval H&P Note (Signed)
History and Physical Interval Note:  03/14/2021 11:59 AM  Debra Jackson  has presented today for surgery, with the diagnosis of Hematemesis.  The various methods of treatment have been discussed with the patient and family. After consideration of risks, benefits and other options for treatment, the patient has consented to  Procedure(s): ESOPHAGOGASTRODUODENOSCOPY (EGD) WITH PROPOFOL (N/A) as a surgical intervention.  The patient's history has been reviewed, patient examined, no change in status, stable for surgery.  I have reviewed the patient's chart and labs.  Questions were answered to the patient's satisfaction.     Milus Banister

## 2021-03-14 NOTE — Anesthesia Postprocedure Evaluation (Signed)
Anesthesia Post Note  Patient: Debra Jackson  Procedure(s) Performed: ESOPHAGOGASTRODUODENOSCOPY (EGD) WITH PROPOFOL BIOPSY     Patient location during evaluation: PACU Anesthesia Type: General Level of consciousness: awake and alert Pain management: pain level controlled Vital Signs Assessment: post-procedure vital signs reviewed and stable Respiratory status: spontaneous breathing, nonlabored ventilation, respiratory function stable and patient connected to nasal cannula oxygen Cardiovascular status: blood pressure returned to baseline and stable Postop Assessment: no apparent nausea or vomiting Anesthetic complications: no   No notable events documented.  Last Vitals:  Vitals:   03/14/21 1327 03/14/21 1353  BP: 130/80 (!) 142/72  Pulse: 89 85  Resp: 19 20  Temp: 36.7 C 36.7 C  SpO2: 94% 95%    Last Pain:  Vitals:   03/14/21 1327  TempSrc:   PainSc: 0-No pain                 Effie Berkshire

## 2021-03-14 NOTE — Transfer of Care (Signed)
Immediate Anesthesia Transfer of Care Note  Patient: Debra Jackson  Procedure(s) Performed: ESOPHAGOGASTRODUODENOSCOPY (EGD) WITH PROPOFOL BIOPSY  Patient Location: PACU  Anesthesia Type:General  Level of Consciousness: awake and drowsy  Airway & Oxygen Therapy: Patient Spontanous Breathing and Patient connected to face mask oxygen  Post-op Assessment: Report given to RN and Post -op Vital signs reviewed and stable  Post vital signs: Reviewed and stable  Last Vitals:  Vitals Value Taken Time  BP    Temp    Pulse    Resp 15   SpO2 96     Last Pain:  Vitals:   03/14/21 1211  TempSrc: Oral  PainSc: 0-No pain         Complications: No notable events documented.

## 2021-03-14 NOTE — ED Notes (Signed)
Endo spoke with this RN & reports they will come get pt for EGD procedure in 45 min to approx. 1 hr.

## 2021-03-14 NOTE — Plan of Care (Signed)

## 2021-03-14 NOTE — ED Notes (Signed)
Pt noted to be saturated in urine and bed disheveled. Complete bed change performed and purewick readjusted. Fresh brief placed on pt, Clean linens. Pt boosted and resting comfortably, denies further needs at this time

## 2021-03-15 DIAGNOSIS — K852 Alcohol induced acute pancreatitis without necrosis or infection: Secondary | ICD-10-CM | POA: Diagnosis not present

## 2021-03-15 LAB — CBC
HCT: 39.9 % (ref 36.0–46.0)
HCT: 35.6 % — ABNORMAL LOW (ref 36.0–46.0)
Hemoglobin: 13.4 g/dL (ref 12.0–15.0)
Hemoglobin: 12.1 g/dL (ref 12.0–15.0)
MCH: 33.3 pg (ref 26.0–34.0)
MCH: 33.1 pg (ref 26.0–34.0)
MCHC: 33.6 g/dL (ref 30.0–36.0)
MCHC: 34 g/dL (ref 30.0–36.0)
MCV: 99 fL (ref 80.0–100.0)
MCV: 97.3 fL (ref 80.0–100.0)
Platelets: 106 10*3/uL — ABNORMAL LOW (ref 150–400)
Platelets: 86 10*3/uL — ABNORMAL LOW (ref 150–400)
RBC: 4.03 MIL/uL (ref 3.87–5.11)
RBC: 3.66 MIL/uL — ABNORMAL LOW (ref 3.87–5.11)
RDW: 15 % (ref 11.5–15.5)
RDW: 15.1 % (ref 11.5–15.5)
WBC: 9 10*3/uL (ref 4.0–10.5)
WBC: 6.7 10*3/uL (ref 4.0–10.5)
nRBC: 0 % (ref 0.0–0.2)
nRBC: 0 % (ref 0.0–0.2)

## 2021-03-15 LAB — BASIC METABOLIC PANEL
Anion gap: 11 (ref 5–15)
BUN: 12 mg/dL (ref 8–23)
CO2: 23 mmol/L (ref 22–32)
Calcium: 8 mg/dL — ABNORMAL LOW (ref 8.9–10.3)
Chloride: 109 mmol/L (ref 98–111)
Creatinine, Ser: 0.79 mg/dL (ref 0.44–1.00)
GFR, Estimated: >60 mL/min (ref >60)
Glucose, Bld: 94 mg/dL (ref 70–99)
Potassium: 2.7 mmol/L — CL (ref 3.5–5.1)
Sodium: 143 mmol/L (ref 135–145)

## 2021-03-15 LAB — COMPREHENSIVE METABOLIC PANEL
ALT: 55 U/L — ABNORMAL HIGH (ref 0–44)
AST: 159 U/L — ABNORMAL HIGH (ref 15–41)
Albumin: 3.2 g/dL — ABNORMAL LOW (ref 3.5–5.0)
Alkaline Phosphatase: 77 U/L (ref 38–126)
Anion gap: 12 (ref 5–15)
BUN: 16 mg/dL (ref 8–23)
CO2: 20 mmol/L — ABNORMAL LOW (ref 22–32)
Calcium: 8.1 mg/dL — ABNORMAL LOW (ref 8.9–10.3)
Chloride: 110 mmol/L (ref 98–111)
Creatinine, Ser: 0.9 mg/dL (ref 0.44–1.00)
GFR, Estimated: >60 mL/min (ref >60)
Glucose, Bld: 142 mg/dL — ABNORMAL HIGH (ref 70–99)
Potassium: 3.3 mmol/L — ABNORMAL LOW (ref 3.5–5.1)
Sodium: 142 mmol/L (ref 135–145)
Total Bilirubin: 1.4 mg/dL — ABNORMAL HIGH (ref 0.3–1.2)
Total Protein: 6.1 g/dL — ABNORMAL LOW (ref 6.5–8.1)

## 2021-03-15 LAB — MAGNESIUM: Magnesium: 1.9 mg/dL (ref 1.7–2.4)

## 2021-03-15 LAB — GLUCOSE, CAPILLARY
Glucose-Capillary: 139 mg/dL — ABNORMAL HIGH (ref 70–99)
Glucose-Capillary: 133 mg/dL — ABNORMAL HIGH (ref 70–99)

## 2021-03-15 LAB — PHOSPHORUS
Phosphorus: 1.2 mg/dL — ABNORMAL LOW (ref 2.5–4.6)
Phosphorus: 2.2 mg/dL — ABNORMAL LOW (ref 2.5–4.6)

## 2021-03-15 MED ORDER — ACETAMINOPHEN 325 MG PO TABS
650.0000 mg | ORAL_TABLET | Freq: Four times a day (QID) | ORAL | Status: AC | PRN
  Administered 2021-03-15: 650 mg via ORAL
  Filled 2021-03-15: qty 2

## 2021-03-15 MED ORDER — PANTOPRAZOLE SODIUM 40 MG PO TBEC
40.0000 mg | DELAYED_RELEASE_TABLET | Freq: Two times a day (BID) | ORAL | Status: DC
  Administered 2021-03-15 – 2021-03-16 (×2): 40 mg via ORAL
  Filled 2021-03-15 (×2): qty 1

## 2021-03-15 MED ORDER — POTASSIUM CHLORIDE 20 MEQ PO PACK
40.0000 meq | PACK | Freq: Two times a day (BID) | ORAL | Status: AC
  Administered 2021-03-15 (×2): 40 meq via ORAL
  Filled 2021-03-15 (×2): qty 2

## 2021-03-15 MED ORDER — POTASSIUM CHLORIDE 10 MEQ/100ML IV SOLN
10.0000 meq | INTRAVENOUS | Status: AC
  Administered 2021-03-15 (×3): 10 meq via INTRAVENOUS
  Filled 2021-03-15 (×4): qty 100

## 2021-03-15 MED ORDER — LOPERAMIDE HCL 2 MG PO CAPS
2.0000 mg | ORAL_CAPSULE | ORAL | Status: DC | PRN
  Administered 2021-03-15 (×3): 2 mg via ORAL
  Filled 2021-03-15 (×3): qty 1

## 2021-03-15 MED ORDER — K PHOS MONO-SOD PHOS DI & MONO 155-852-130 MG PO TABS
500.0000 mg | ORAL_TABLET | Freq: Two times a day (BID) | ORAL | Status: AC
  Administered 2021-03-15 (×2): 500 mg via ORAL
  Filled 2021-03-15 (×2): qty 2

## 2021-03-15 NOTE — Progress Notes (Signed)
Pt's IV was accidentally removed (partially) by pt while on the way tot he [facility]. RN attempted to save the IV but it continued to leak after the dressing change. Pt's IV was then fully removed by staff and an order for IV placement was placed for the IV team.

## 2021-03-15 NOTE — Progress Notes (Addendum)
HD#2 Subjective:  Overnight Events: no event  Patient was seen at bedside with family member.  She reports several episodes of watery diarrhea overnight. She reports minimal nausea and denies vomiting, saying that she has been spitting up some phlegm. She is informed about the results of the EGD and plan for today.   Objective:  Vital signs in last 24 hours: Vitals:   03/14/21 2003 03/14/21 2354 03/15/21 0459 03/15/21 0500  BP: (!) 143/86 (!) 151/94 (!) 151/94   Pulse: 84 84 79   Resp: 20 18 18    Temp: 98.9 F (37.2 C) 98 F (36.7 C) 98.2 F (36.8 C)   TempSrc: Oral Oral Oral   SpO2: 97% 95% 93%   Weight:    85.2 kg  Height:       Supplemental O2: Room Air SpO2: 93 %   Physical Exam:  Physical Exam Cardiovascular:     Rate and Rhythm: Normal rate and regular rhythm.     Pulses: Normal pulses.     Heart sounds: No murmur heard. Pulmonary:     Effort: Pulmonary effort is normal.     Breath sounds: Normal breath sounds.  Abdominal:     General: Bowel sounds are normal.     Palpations: Abdomen is soft.     Tenderness: There is no abdominal tenderness.  Neurological:     Mental Status: She is alert.  Psychiatric:     Comments: She appears anxious     Filed Weights   03/14/21 1211 03/15/21 0500  Weight: 77.1 kg 85.2 kg     Intake/Output Summary (Last 24 hours) at 03/15/2021 7494 Last data filed at 03/15/2021 0352 Gross per 24 hour  Intake 596.67 ml  Output 2 ml  Net 594.67 ml    Net IO Since Admission: 6,272.81 mL [03/15/21 0712]  Pertinent Labs: CBC Latest Ref Rng & Units 03/15/2021 03/14/2021 03/13/2021  WBC 4.0 - 10.5 K/uL 6.7 9.0 -  Hemoglobin 12.0 - 15.0 g/dL 12.1 13.4 15.0  Hematocrit 36.0 - 46.0 % 35.6(L) 39.9 44.0  Platelets 150 - 400 K/uL 86(L) 106(L) -    CMP Latest Ref Rng & Units 03/15/2021 03/14/2021 03/13/2021  Glucose 70 - 99 mg/dL 142(H) 186(H) 176(H)  BUN 8 - 23 mg/dL 16 22 22   Creatinine 0.44 - 1.00 mg/dL 0.90 1.40(H) 1.76(H)   Sodium 135 - 145 mmol/L 142 141 140  Potassium 3.5 - 5.1 mmol/L 3.3(L) 3.5 3.8  Chloride 98 - 111 mmol/L 110 109 107  CO2 22 - 32 mmol/L 20(L) 17(L) 11(L)  Calcium 8.9 - 10.3 mg/dL 8.1(L) 8.3(L) 8.0(L)  Total Protein 6.5 - 8.1 g/dL 6.1(L) 6.4(L) -  Total Bilirubin 0.3 - 1.2 mg/dL 1.4(H) 1.8(H) -  Alkaline Phos 38 - 126 U/L 77 78 -  AST 15 - 41 U/L 159(H) 176(H) -  ALT 0 - 44 U/L 55(H) 60(H) -    Imaging: CT A/P notable for mild stranding of peripancreatic fat, concerning for pancreatitis RUQ showed gallstones but negative for CBD dilation  Assessment/Plan:   Patient Summary: Debra Jackson is a 65 year old female with a PMHx of GERD, depression, anxiety, opiate use disorder, and polysubstance use, presenting to the ED with hematemesis, found to have pancreatitis.  #Hematemesis #Pancreatitis EGD yesterday showed only mild gastritis and hiatal hernia, no UGIB or source for hematemesis. Likely source is mallory-weiss tear 2/2 pancreatitis induced N/V. Hgb stable at 12 this morning. Currently patient has little nausea and no vomiting x24hrs.  -  GI following, appreciate recs -D5, 1/2NS at 100 mL/hr -Advance to heart healthy diet today -Scheduled Zofran 4 mg q8hrs -Dilaudid 0.5 mg q4hrs PRN -IV Protonix 40 mg BID -f/u EGD biopsy results (H. Pylori) -Monitor for signs of pancreatic necrosis  #Alcohol use disorder #Benzo dependence CIWA of 3 this morning. Patient does not appear to be in withdrawal currently.  -Continue CIWA with Ativan -Thiamine/folate supplement -Discussed need for abstinence  -Consider Naltrexone/acamprosate   #Hypokalemia #Hypophosphatemia K of 3.3, phos of 1.2 this morning. Currently getting Kphos 51mmol IV.  -Check noon phos and K -Replace as appropriate  #Diarrhea Nursing confirms several watery BM overnight. No ABD tenderness, fever, or leukocytosis. Low suspicion for C. Diff. -Continue to monitor   #Transaminitis AST/ALT continue to downtrend.  Now at 159/55.   -Continue to follow with [facility]   #AGMA (resolving) #AKI - likely prerenal (resolved) With fluid replacement, bicarb is now up to 20 and AKI has resolved. -Continue D5-1/2NS for hydration   # Known sigmoid tubulovillous adenoma -Plan for general surgery consult on Monday (consider simultaneous cholecystectomy)   Full code DVT: SCD Diet: NPO --> advancing diet today IVF: D5 1/2 NS  Corky Sox, MD 03/15/2021, 7:12 AM Pager: [phone]  Please contact the on call pager after 5 pm and on weekends at [phone].

## 2021-03-15 NOTE — Progress Notes (Signed)
PHARMACIST - PHYSICIAN COMMUNICATION  DR:   Alvie Heidelberg  CONCERNING: IV to Oral Route Change Policy  RECOMMENDATION: This patient is receiving protonix by the intravenous route.  Based on criteria approved by the Pharmacy and Therapeutics Committee, the intravenous medication(s) is/are being converted to the equivalent oral dose form(s).   DESCRIPTION: These criteria include: The patient is eating (either orally or via tube) and/or has been taking other orally administered medications for a least 24 hours The patient has no evidence of active gastrointestinal bleeding or impaired GI absorption (gastrectomy, short bowel, patient on TNA or NPO).  If you have questions about this conversion, please contact the Pharmacy Department  []   [phone] )  [facility] []   [phone] )  [facility] [x]   [phone] )  [facility] []   [phone] )  [facility] []   [phone] )  [facility]

## 2021-03-15 NOTE — Progress Notes (Signed)
[facility] Progress Note    Since last GI note: EGD yesterday, see full report in epic.    Her biggest complaint today is diarrhea, non bloody.  Says its been going on for 7-8 days.  No abd pains.  No nausea or vomiting.  Objective: Vital signs in last 24 hours: Temp:  [97.8 F (36.6 C)-98.9 F (37.2 C)] 97.8 F (36.6 C) (11/20 0700) Pulse Rate:  [79-104] 88 (11/20 0700) Resp:  [18-26] 20 (11/20 0700) BP: (119-160)/(72-95) 160/94 (11/20 0700) SpO2:  [93 %-98 %] 98 % (11/20 0700) Weight:  [77.1 kg-85.2 kg] 85.2 kg (11/20 0500) Last BM Date: 03/14/21 General: alert and oriented times 3 Heart: regular rate and rythm Abdomen: soft, non-tender, non-distended, normal bowel sounds  Lab Results: Recent Labs    03/13/21 1008 03/13/21 1623 03/13/21 1805 03/14/21 0328 03/15/21 0306  WBC 12.9*  --   --  9.0 6.7  HGB 15.2*   < > 15.0 13.4 12.1  PLT 156  --   --  106* 86*  MCV 108.4*  --   --  99.0 97.3   < > = values in this interval not displayed.   Recent Labs    03/13/21 2255 03/14/21 0634 03/15/21 0306  NA 140 141 142  K 3.8 3.5 3.3*  CL 107 109 110  CO2 11* 17* 20*  GLUCOSE 176* 186* 142*  BUN 22 22 16   CREATININE 1.76* 1.40* 0.90  CALCIUM 8.0* 8.3* 8.1*   Recent Labs    03/13/21 1008 03/14/21 0634 03/15/21 0306  PROT 8.3* 6.4* 6.1*  ALBUMIN 4.2 3.4* 3.2*  AST 212* 176* 159*  ALT 77* 60* 55*  ALKPHOS 105 78 77  BILITOT 2.0* 1.8* 1.4*   Recent Labs    03/13/21 2255  INR 1.2    Medications: Scheduled Meds:  folic acid  1 mg Oral Daily   multivitamin with minerals  1 tablet Oral Daily   ondansetron (ZOFRAN) IV  4 mg Intravenous Q8H   pantoprazole (PROTONIX) IV  40 mg Intravenous Q12H   Continuous Infusions:  dextrose 5 % and 0.45% NaCl 100 mL/hr at 03/14/21 2316   potassium PHOSPHATE IVPB (in mmol) 40 mmol (03/15/21 0213)   PRN Meds:.HYDROmorphone (DILAUDID) injection, loperamide, LORazepam **OR**  LORazepam  Assessment/Plan: 65 y.o. female with acute pancreatitis (etoh +/- biliary given stones in GB), alcoholic hepatitis; diarrhea; mass in colon  Her acute pancreatitis seems quite mild.  Probably related to etoh.  I am going to allow solid food today since she has no pains, no nausae or vomiting. She does have gallstones and so there is a chance her pancreatitis is biliary. Maybe she should have GB removed at same time as colon surgery, see below.  Alcoholic hepatitis.  I explained she should really stop drinking completely.  DIarrhea.  A single imodium this AM helped. I am going to order GI pathogen panel this AM and c. Diff as well (no recent abx however).  Colon Mass.  THis was found 6 months ago. Surgery was planning to resect it but she never really followed up.  She and her husband would like to reestablish with [facility] this admission because they understand the polyp is large, advanced and will turn to cancer (if it doesn't already harbor cancer already).  I recommend asking [facility] to see her, probably safe to wait till Monday as it is not urgent at all.  We will see her tomorrow.    Milus Banister, MD  03/15/2021,  9:32 AM [facility] Pager [phone]

## 2021-03-15 NOTE — Progress Notes (Signed)
Lab called to inform RN that pt's potassium was at a value of 2.7. RN attempted to contact the pager number [phone]) that was left at the end of Dr. [place] note. Waiting for someone to respond to page at this time.

## 2021-03-15 NOTE — Plan of Care (Signed)

## 2021-03-16 DIAGNOSIS — E876 Hypokalemia: Secondary | ICD-10-CM

## 2021-03-16 DIAGNOSIS — K6389 Other specified diseases of intestine: Secondary | ICD-10-CM

## 2021-03-16 DIAGNOSIS — R197 Diarrhea, unspecified: Secondary | ICD-10-CM

## 2021-03-16 DIAGNOSIS — K8500 Idiopathic acute pancreatitis without necrosis or infection: Secondary | ICD-10-CM

## 2021-03-16 DIAGNOSIS — K852 Alcohol induced acute pancreatitis without necrosis or infection: Secondary | ICD-10-CM | POA: Diagnosis not present

## 2021-03-16 LAB — BASIC METABOLIC PANEL
Anion gap: 9 (ref 5–15)
BUN: 9 mg/dL (ref 8–23)
CO2: 24 mmol/L (ref 22–32)
Calcium: 7.6 mg/dL — ABNORMAL LOW (ref 8.9–10.3)
Chloride: 110 mmol/L (ref 98–111)
Creatinine, Ser: 0.73 mg/dL (ref 0.44–1.00)
GFR, Estimated: >60 mL/min (ref >60)
Glucose, Bld: 101 mg/dL — ABNORMAL HIGH (ref 70–99)
Potassium: 2.9 mmol/L — ABNORMAL LOW (ref 3.5–5.1)
Sodium: 143 mmol/L (ref 135–145)

## 2021-03-16 LAB — GLUCOSE, CAPILLARY
Glucose-Capillary: 102 mg/dL — ABNORMAL HIGH (ref 70–99)
Glucose-Capillary: 104 mg/dL — ABNORMAL HIGH (ref 70–99)
Glucose-Capillary: 157 mg/dL — ABNORMAL HIGH (ref 70–99)
Glucose-Capillary: 90 mg/dL (ref 70–99)
Glucose-Capillary: 94 mg/dL (ref 70–99)

## 2021-03-16 LAB — CBC
HCT: 36.5 % (ref 36.0–46.0)
Hemoglobin: 12.4 g/dL (ref 12.0–15.0)
MCH: 33.1 pg (ref 26.0–34.0)
MCHC: 34 g/dL (ref 30.0–36.0)
MCV: 97.3 fL (ref 80.0–100.0)
Platelets: 87 10*3/uL — ABNORMAL LOW (ref 150–400)
RBC: 3.75 MIL/uL — ABNORMAL LOW (ref 3.87–5.11)
RDW: 15.1 % (ref 11.5–15.5)
WBC: 5.2 10*3/uL (ref 4.0–10.5)
nRBC: 0 % (ref 0.0–0.2)

## 2021-03-16 LAB — MAGNESIUM: Magnesium: 1.6 mg/dL — ABNORMAL LOW (ref 1.7–2.4)

## 2021-03-16 LAB — PHOSPHORUS: Phosphorus: 3.3 mg/dL (ref 2.5–4.6)

## 2021-03-16 LAB — POTASSIUM: Potassium: 3.5 mmol/L (ref 3.5–5.1)

## 2021-03-16 MED ORDER — MAGNESIUM SULFATE 2 GM/50ML IV SOLN
2.0000 g | Freq: Once | INTRAVENOUS | Status: DC
  Filled 2021-03-16: qty 50

## 2021-03-16 MED ORDER — MAGNESIUM SULFATE 4 GM/100ML IV SOLN
4.0000 g | Freq: Once | INTRAVENOUS | Status: AC
  Administered 2021-03-16: 4 g via INTRAVENOUS
  Filled 2021-03-16: qty 100

## 2021-03-16 MED ORDER — POTASSIUM CHLORIDE 20 MEQ PO PACK
40.0000 meq | PACK | Freq: Two times a day (BID) | ORAL | Status: AC
  Administered 2021-03-16 (×2): 40 meq via ORAL
  Filled 2021-03-16 (×2): qty 2

## 2021-03-16 MED ORDER — OXYCODONE HCL 5 MG PO TABS
5.0000 mg | ORAL_TABLET | Freq: Four times a day (QID) | ORAL | Status: DC | PRN
  Administered 2021-03-16 – 2021-03-18 (×5): 5 mg via ORAL
  Filled 2021-03-16 (×5): qty 1

## 2021-03-16 MED ORDER — ONDANSETRON HCL 4 MG/2ML IJ SOLN: 4.0000 mg | Freq: Four times a day (QID) | INTRAMUSCULAR | Status: DC | PRN

## 2021-03-16 MED ORDER — LACTATED RINGERS IV SOLN
INTRAVENOUS | Status: AC
Start: 2021-03-16 — End: 2021-03-16

## 2021-03-16 MED ORDER — CLONAZEPAM 0.5 MG PO TABS
0.5000 mg | ORAL_TABLET | Freq: Two times a day (BID) | ORAL | Status: DC | PRN
  Administered 2021-03-16 – 2021-03-17 (×3): 0.5 mg via ORAL
  Filled 2021-03-16 (×3): qty 1

## 2021-03-16 MED ORDER — ALBUTEROL SULFATE HFA 108 (90 BASE) MCG/ACT IN AERS
2.0000 | INHALATION_SPRAY | Freq: Four times a day (QID) | RESPIRATORY_TRACT | Status: DC | PRN
  Administered 2021-03-16: 2 via RESPIRATORY_TRACT
  Filled 2021-03-16: qty 6.7

## 2021-03-16 MED ORDER — RIVAROXABAN 10 MG PO TABS
10.0000 mg | ORAL_TABLET | Freq: Every day | ORAL | Status: DC
  Administered 2021-03-16 – 2021-03-18 (×3): 10 mg via ORAL
  Filled 2021-03-16 (×3): qty 1

## 2021-03-16 MED ORDER — POTASSIUM CHLORIDE 10 MEQ/100ML IV SOLN
10.0000 meq | Freq: Once | INTRAVENOUS | Status: AC
  Administered 2021-03-16: 10 meq via INTRAVENOUS

## 2021-03-16 NOTE — Progress Notes (Addendum)
    Progress Note   Subjective  Chief complaint: Diarrhea  Frustrated by ongoing diarrhea. No abdominal pain, nausea, or vomiting. Wants to have surgery before she goes home. No new complaints or concerns.    Objective  Vital signs in last 24 hours: Temp:  [97.5 F (36.4 C)-99.4 F (37.4 C)] 98.6 F (37 C) (11/21 1626) Pulse Rate:  [68-80] 68 (11/21 1626) Resp:  [14-18] 14 (11/21 1626) BP: (124-154)/(75-92) 124/75 (11/21 1626) SpO2:  [94 %-97 %] 97 % (11/21 1626) Weight:  [86.4 kg] 86.4 kg (11/21 0500) Last BM Date: 03/15/21  General: Alert, well-developed, in NAD Heart:  Regular rate and rhythm; no murmurs Chest: Clear to ascultation bilaterally Abdomen:  Soft, nontender, ondistended. Normal bowel sounds. There is no rebound or guarding.  Extremities:  Without edema. Neurologic:  Alert and  oriented x 4; grossly normal neurologically. Psych:  Alert and cooperative. Normal mood and affect.  Lab Results: Recent Labs    03/14/21 0328 03/15/21 0306 03/16/21 0353  WBC 9.0 6.7 5.2  HGB 13.4 12.1 12.4  HCT 39.9 35.6* 36.5  PLT 106* 86* 87*   BMET Recent Labs    03/15/21 0306 03/15/21 1709 03/16/21 0353 03/16/21 0917  NA 142 143 143  --   K 3.3* 2.7* 2.9* 3.5  CL 110 109 110  --   CO2 20* 23 24  --   GLUCOSE 142* 94 101*  --   BUN 16 12 9   --   CREATININE 0.90 0.79 0.73  --   CALCIUM 8.1* 8.0* 7.6*  --    LFT Recent Labs    03/15/21 0306  PROT 6.1*  ALBUMIN 3.2*  AST 159*  ALT 55*  ALKPHOS 77  BILITOT 1.4*   PT/INR Recent Labs    03/13/21 2255  LABPROT 15.6*  INR 1.2       Assessment & Recommendations  65 y.o. female with acute pancreatitis (etoh +/- biliary given stones in GB), alcoholic hepatitis; diarrhea; mass in colon   Her acute pancreatitis seems quite mild.  She has tolerated a diet advance without progression in symptoms. She does have gallstones and so there is a chance her pancreatitis is biliary. Maybe she should have GB removed at  same time as colon surgery.   DIarrhea.  GI pathogen panel and C Diff have been ordered but not collected. Her diarrhea may ultimately be overflow diarrhea related to her colon mass.  Alcoholic hepatitis.  Complete abstinence recommended.   Colon Mass.  This was found 6 months ago. Surgery was planning to resect it but she never really followed up.  Hopefully surgery will consider resection prior to discharge.        LOS: 3 days   Thornton Park  03/16/2021, 4:36 PM

## 2021-03-16 NOTE — Progress Notes (Signed)
HD#3 Subjective:  Overnight Events: the overnight team was called for patient report of wheezing. She was given albuterol x1 and an incentive spirometer.   The patient is seen and assessed at bedside this morning. She reports that taking her vitamins made her gag. She reports good PO intake, eating salmon lat night and fruit and cottage cheese this morning. She reports having a lot of non-bloody diarrhea over the past 24 hrs.   Objective:  Vital signs in last 24 hours: Vitals:   03/16/21 0430 03/16/21 0500 03/16/21 0732 03/16/21 1141  BP: (!) 150/90  (!) 154/92 (!) 144/76  Pulse: 76  76 77  Resp: 17  17 18   Temp: 98.4 F (36.9 C)  (!) 97.5 F (36.4 C) 98.4 F (36.9 C)  TempSrc: Oral   Oral  SpO2: 95%  94% 96%  Weight:  86.4 kg    Height:       Supplemental O2: Room Air SpO2: 96 %   Physical Exam:  Physical Exam Constitutional:      Appearance: She is not toxic-appearing.  Cardiovascular:     Rate and Rhythm: Normal rate and regular rhythm.     Pulses: Normal pulses.     Heart sounds: No murmur heard. Pulmonary:     Effort: Pulmonary effort is normal.     Breath sounds: Normal breath sounds.     Comments: No wheezing on exam Abdominal:     General: Bowel sounds are normal.     Palpations: Abdomen is soft.     Tenderness: There is no abdominal tenderness.  Neurological:     Mental Status: She is alert.  Psychiatric:     Comments: She appears anxious      Filed Weights   03/14/21 1211 03/15/21 0500 03/16/21 0500  Weight: 77.1 kg 85.2 kg 86.4 kg     Intake/Output Summary (Last 24 hours) at 03/16/2021 1207 Last data filed at 03/16/2021 0951 Gross per 24 hour  Intake 2377.7 ml  Output 2 ml  Net 2375.7 ml    Net IO Since Admission: 8,648.51 mL [03/16/21 1207]  Pertinent Labs: CBC Latest Ref Rng & Units 03/16/2021 03/15/2021 03/14/2021  WBC 4.0 - 10.5 K/uL 5.2 6.7 9.0  Hemoglobin 12.0 - 15.0 g/dL 12.4 12.1 13.4  Hematocrit 36.0 - 46.0 % 36.5 35.6(L)  39.9  Platelets 150 - 400 K/uL 87(L) 86(L) 106(L)    CMP Latest Ref Rng & Units 03/16/2021 03/16/2021 03/15/2021  Glucose 70 - 99 mg/dL - 101(H) 94  BUN 8 - 23 mg/dL - 9 12  Creatinine 0.44 - 1.00 mg/dL - 0.73 0.79  Sodium 135 - 145 mmol/L - 143 143  Potassium 3.5 - 5.1 mmol/L 3.5 2.9(L) 2.7(LL)  Chloride 98 - 111 mmol/L - 110 109  CO2 22 - 32 mmol/L - 24 23  Calcium 8.9 - 10.3 mg/dL - 7.6(L) 8.0(L)  Total Protein 6.5 - 8.1 g/dL - - -  Total Bilirubin 0.3 - 1.2 mg/dL - - -  Alkaline Phos 38 - 126 U/L - - -  AST 15 - 41 U/L - - -  ALT 0 - 44 U/L - - -    Imaging: CT A/P notable for mild stranding of peripancreatic fat, concerning for pancreatitis RUQ showed gallstones but negative for CBD dilation  Assessment/Plan:   Patient Summary: Debra Jackson is a 65 year old female with a PMHx of GERD, depression, anxiety, opiate use disorder, and polysubstance use, presenting to the ED with hematemesis, found  to have pancreatitis.  #Hematemesis #Pancreatitis Patient continues to improve, tolerating a diet well. Minimal pain and no tenderness. Able to ambulate. No recurrence of hematemesis. It was likely 2/2 Mallory-Weiss tears that have since resolved.  -GI following, appreciate recs -LR at 100 mL/hr for 10 hrs primarily for diarrhea (see below) -Continue heart healthy diet -Change scheduled Zofran 4 mg to PRN q8hrs -Switch dilaudid to oxycodone 5 mg q6hrs PRN -Discontinue IV Protonix 40 mg BID -f/u EGD biopsy results (H. Pylori)  #Diarrhea Patient reports continued watery diarrhea. No ABD tenderness, fever, or leukocytosis. Low suspicion for C. Diff., no recent ABX. Likely contributing to electrolyte abnormalities.  -Discontinue imodium  -C. Diff and GI PCR if able to collect -Fluids as above -Continue to monitor  #Hypokalemia #Hypomagnesemia #Hypophosphatemia (resolved) K of 2.7, replaced with 40 meq PO and 40 meq IV. K currently 3.5. Mag of 1.6, given 4 g of mag sulfate.   -Kcl 40 meq PO x1 -Recheck tomorrow AM -Replace as appropriate  #Alcohol use disorder #Benzo dependence CIWA of 5 this morning, scoring for anxiety and tremor. Patient does not appear to be in withdrawal currently.  -Continue CIWA, discontinue Ativan -Restart home Klonopin 0.5 mg BID PRN anxiety -Thiamine/folate supplement -Discussed need for abstinence from alcohol -Consider Naltrexone/acamprosate   #Transaminitis AST/ALT 159/55.  -Recheck CMP tomorrow  # Known sigmoid tubulovillous adenoma General surgery recommending outpatient follow up with colorectal surgery. Follow up info in AVS.    #AGMA (resolving) #AKI - likely prerenal (resolved) With fluid replacement, bicarb is now up to 20 and AKI has resolved. -Continue D5-1/2NS for hydration   Full code DVT: Xarelto  Diet: heart healthy IVF: 100 ml/hr LR, for 10 hrs  Corky Sox, MD 03/16/2021, 12:07 PM Pager: [phone]  Please contact the on call pager after 5 pm and on weekends at [phone].

## 2021-03-16 NOTE — Plan of Care (Signed)

## 2021-03-17 ENCOUNTER — Encounter (HOSPITAL_COMMUNITY): Payer: Self-pay | Admitting: Gastroenterology

## 2021-03-17 DIAGNOSIS — E876 Hypokalemia: Secondary | ICD-10-CM | POA: Diagnosis not present

## 2021-03-17 DIAGNOSIS — K6389 Other specified diseases of intestine: Secondary | ICD-10-CM | POA: Diagnosis not present

## 2021-03-17 DIAGNOSIS — K8500 Idiopathic acute pancreatitis without necrosis or infection: Secondary | ICD-10-CM | POA: Diagnosis not present

## 2021-03-17 DIAGNOSIS — K852 Alcohol induced acute pancreatitis without necrosis or infection: Principal | ICD-10-CM

## 2021-03-17 LAB — CBC WITH DIFFERENTIAL/PLATELET
Abs Immature Granulocytes: 0.07 10*3/uL (ref 0.00–0.07)
Basophils Absolute: 0 10*3/uL (ref 0.0–0.1)
Basophils Relative: 1 %
Eosinophils Absolute: 0.1 10*3/uL (ref 0.0–0.5)
Eosinophils Relative: 3 %
HCT: 38.4 % (ref 36.0–46.0)
Hemoglobin: 12.8 g/dL (ref 12.0–15.0)
Immature Granulocytes: 2 %
Lymphocytes Relative: 16 %
Lymphs Abs: 0.7 10*3/uL (ref 0.7–4.0)
MCH: 33.1 pg (ref 26.0–34.0)
MCHC: 33.3 g/dL (ref 30.0–36.0)
MCV: 99.2 fL (ref 80.0–100.0)
Monocytes Absolute: 0.4 10*3/uL (ref 0.1–1.0)
Monocytes Relative: 9 %
Neutro Abs: 3.3 10*3/uL (ref 1.7–7.7)
Neutrophils Relative %: 69 %
Platelets: 95 10*3/uL — ABNORMAL LOW (ref 150–400)
RBC: 3.87 MIL/uL (ref 3.87–5.11)
RDW: 15.1 % (ref 11.5–15.5)
WBC: 4.7 10*3/uL (ref 4.0–10.5)
nRBC: 0 % (ref 0.0–0.2)

## 2021-03-17 LAB — GLUCOSE, CAPILLARY
Glucose-Capillary: 111 mg/dL — ABNORMAL HIGH (ref 70–99)
Glucose-Capillary: 120 mg/dL — ABNORMAL HIGH (ref 70–99)
Glucose-Capillary: 121 mg/dL — ABNORMAL HIGH (ref 70–99)
Glucose-Capillary: 129 mg/dL — ABNORMAL HIGH (ref 70–99)
Glucose-Capillary: 142 mg/dL — ABNORMAL HIGH (ref 70–99)

## 2021-03-17 LAB — BASIC METABOLIC PANEL
Anion gap: 8 (ref 5–15)
BUN: 7 mg/dL — ABNORMAL LOW (ref 8–23)
CO2: 25 mmol/L (ref 22–32)
Calcium: 7.4 mg/dL — ABNORMAL LOW (ref 8.9–10.3)
Chloride: 107 mmol/L (ref 98–111)
Creatinine, Ser: 0.64 mg/dL (ref 0.44–1.00)
GFR, Estimated: >60 mL/min (ref >60)
Glucose, Bld: 117 mg/dL — ABNORMAL HIGH (ref 70–99)
Potassium: 2.9 mmol/L — ABNORMAL LOW (ref 3.5–5.1)
Sodium: 140 mmol/L (ref 135–145)

## 2021-03-17 LAB — HEPATIC FUNCTION PANEL
ALT: 44 U/L (ref 0–44)
AST: 86 U/L — ABNORMAL HIGH (ref 15–41)
Albumin: 2.9 g/dL — ABNORMAL LOW (ref 3.5–5.0)
Alkaline Phosphatase: 79 U/L (ref 38–126)
Bilirubin, Direct: 0.2 mg/dL (ref 0.0–0.2)
Indirect Bilirubin: 0.8 mg/dL (ref 0.3–0.9)
Total Bilirubin: 1 mg/dL (ref 0.3–1.2)
Total Protein: 6 g/dL — ABNORMAL LOW (ref 6.5–8.1)

## 2021-03-17 LAB — SURGICAL PATHOLOGY

## 2021-03-17 LAB — POTASSIUM: Potassium: 3.9 mmol/L (ref 3.5–5.1)

## 2021-03-17 LAB — MAGNESIUM: Magnesium: 1.9 mg/dL (ref 1.7–2.4)

## 2021-03-17 MED ORDER — POTASSIUM CHLORIDE 20 MEQ PO PACK: 40.0000 meq | PACK | Freq: Two times a day (BID) | ORAL | Status: DC

## 2021-03-17 MED ORDER — POTASSIUM CHLORIDE 20 MEQ PO PACK
40.0000 meq | PACK | Freq: Once | ORAL | Status: AC
  Administered 2021-03-17: 40 meq via ORAL
  Filled 2021-03-17: qty 2

## 2021-03-17 MED ORDER — CLONAZEPAM 0.5 MG PO TABS
0.5000 mg | ORAL_TABLET | Freq: Every day | ORAL | Status: DC | PRN
  Administered 2021-03-18 (×2): 0.5 mg via ORAL
  Filled 2021-03-17 (×2): qty 1

## 2021-03-17 MED ORDER — POTASSIUM CHLORIDE 20 MEQ PO PACK: 40.0000 meq | PACK | Freq: Once | ORAL | Status: DC

## 2021-03-17 MED ORDER — POTASSIUM CHLORIDE 10 MEQ/100ML IV SOLN
10.0000 meq | INTRAVENOUS | Status: AC
  Administered 2021-03-17 (×5): 10 meq via INTRAVENOUS
  Filled 2021-03-17 (×5): qty 100

## 2021-03-17 NOTE — Progress Notes (Signed)
OT Evaluation  PTA pt lives independently with her husband. Per pt, her husband travels out of town 2-3 days per week and during that time she is alone. When asked, pt states her husband could stay with her while she gets stronger at home. At this time, pt requires minguard A with mobility and ADL due to below listed deficits. Pt declined sitting out of bed in chair. Encouraged pt to walk with staff and spend more time OOB in chair. Will follow acutely to facilitate safe DC home however do not recommend follow up OT.     03/17/21 1200  OT Visit Information  Last OT Received On 03/17/21  Assistance Needed +1  History of Present Illness Pt is a 65 y/o female admitted secondary to nausea/vomiting and abdominal pain. Found to have acute pancreatitis. Pt is s/p EGD on 11/19. PH includes opiate abuse, polysubstance abuse, alcohol abuse, and colon mass.  Precautions  Precautions Fall  Home Living  Family/patient expects to be discharged to: Private residence  Living Arrangements Spouse/significant other  Available Help at Discharge Family;Available PRN/intermittently  Type of Home Apartment  Home Access Level entry  Home Layout One level  Bathroom Shower/Tub Biochemist, clinical Yes  How Accessible Accessible via walker;Accessible via wheelchair  Home Equipment None  Additional Comments Husband is gone 2-3 days/week - potentially can stay with her initially (lives in handicapped apt)  Prior Function  Prior Level of Function  Independent/Modified Independent  Communication  Communication No difficulties  Pain Assessment  Pain Assessment 0-10  Faces Pain Scale 4  Pain Location upper abdomen  Pain Descriptors / Indicators Guarding  Pain Intervention(s) Monitored during session  Cognition  Arousal/Alertness Awake/alert  Behavior During Therapy Anxious  Overall Cognitive Status Impaired/Different from baseline  Area of Impairment Problem  solving  Problem Solving Slow processing  General Comments Pt very anxious about going home  Upper Extremity Assessment  Upper Extremity Assessment Overall WFL for tasks assessed;Generalized weakness  Lower Extremity Assessment  Lower Extremity Assessment Defer to PT evaluation  Cervical / Trunk Assessment  Cervical / Trunk Assessment Normal  Vision- History  Baseline Vision/History 1 Wears glasses (reading only)  ADL  Overall ADL's  Needs assistance/impaired  Grooming Modified independent  Upper Body Bathing Set up  Lower Body Bathing Min guard;Sit to/from stand  Upper Body Dressing  Set up  Lower Body Dressing Min guard  Toilet Transfer Min guard  Toileting- Clothing Manipulation and Hygiene Supervision/safety  Functional mobility during ADLs Min guard  Bed Mobility  Overal bed mobility Modified Independent  Transfers  Overall transfer level Needs assistance  Equipment used None  Transfers Sit to/from Stand  Sit to Stand Supervision  Balance  Overall balance assessment Needs assistance  Sitting-balance support No upper extremity supported;Feet supported  Sitting balance-Leahy Scale Good  Standing balance support Single extremity supported;No upper extremity supported  Standing balance-Leahy Scale Fair  Standing balance comment Able to maintain static standing without UE support  General Comments  General comments (skin integrity, edema, etc.) Comlains of fatigue  OT - End of Session  Equipment Utilized During Treatment Gait belt  Activity Tolerance Patient tolerated treatment well  Patient left in bed;with call bell/phone within reach (declined sitting up in chair)  Nurse Communication Other (comment) (encourage ambulation)  OT Assessment  OT Recommendation/Assessment Patient needs continued OT Services  OT Visit Diagnosis Unsteadiness on feet (R26.81);Muscle weakness (generalized) (M62.81);Pain  Pain - part of body  (abdomen)  OT  Problem List Decreased  strength;Decreased activity tolerance;Decreased safety awareness;Decreased knowledge of use of DME or AE;Obesity;Pain  OT Plan  OT Frequency (ACUTE ONLY) Min 2X/week  OT Treatment/Interventions (ACUTE ONLY) Self-care/ADL training;Energy conservation;Therapeutic exercise;DME and/or AE instruction;Therapeutic activities;Patient/family education;Balance training  AM-PAC OT "6 Clicks" Daily Activity Outcome Measure (Version 2)  Help from another person eating meals? 4  Help from another person taking care of personal grooming? 4  Help from another person toileting, which includes using toliet, bedpan, or urinal? 3  Help from another person bathing (including washing, rinsing, drying)? 3  Help from another person to put on and taking off regular upper body clothing? 3  Help from another person to put on and taking off regular lower body clothing? 3  6 Click Score 20  Progressive Mobility  What is the highest level of mobility based on the progressive mobility assessment? Level 5 (Walks with assist in room/hall) - Balance while stepping forward/back and can walk in room with assist - Complete  Mobility Out of bed for toileting;Out of bed to chair with meals  OT Recommendation  Follow Up Recommendations No OT follow up  Assistance recommended at discharge Intermittent Supervision/Assistance  Functional Status Assessent Patient has had a recent decline in their functional status and demonstrates the ability to make significant improvements in function in a reasonable and predictable amount of time.  OT Equipment BSC/3in1  Individuals Consulted  Consulted and Agree with Results and Recommendations Patient  Acute Rehab OT Goals  Patient Stated Goal to go back to bed  OT Goal Formulation With patient  Time For Goal Achievement 03/31/21  Potential to Achieve Goals Good  OT Time Calculation  OT Start Time (ACUTE ONLY) 1245  OT Stop Time (ACUTE ONLY) 1306  OT Time Calculation (min) 21 min  OT  General Charges  $OT Visit 1 Visit  OT Evaluation  $OT Eval Low Complexity 1 Low  Written Expression  Dominant Hand Right  Maurie Boettcher, OT/L   Acute OT Clinical Specialist [place] Pager [phone] Office [phone]

## 2021-03-17 NOTE — Progress Notes (Addendum)
Daily Rounding Note  03/17/2021, 9:27 AM  LOS: 4 days   SUBJECTIVE:   Chief complaint:    Diarrhea.  Acute pancreatitis.  CGE Some pain in lower abdomen mostly on the right, this morning after drinking warm liquid potassium.  No nausea or vomiting.  No diarrhea or stools for more than 36 hours.  OBJECTIVE:         Vital signs in last 24 hours:    Temp:  [97.9 F (36.6 C)-98.6 F (37 C)] 98.1 F (36.7 C) (11/22 0823) Pulse Rate:  [68-81] 72 (11/22 0823) Resp:  [14-18] 17 (11/22 0823) BP: (124-157)/(71-96) 138/85 (11/22 0823) SpO2:  [92 %-98 %] 95 % (11/22 0823) Last BM Date: 03/15/21 Filed Weights   03/14/21 1211 03/15/21 0500 03/16/21 0500  Weight: 77.1 kg 85.2 kg 86.4 kg   General: Tremulous, looks unwell. Heart: RRR. Chest: Clear with excellent breath sounds bilaterally.  No labored breathing.  No cough. Abdomen: Soft with minimal tenderness.  No guarding or rebound.  Bowel sounds active. Extremities: No CCE. Neuro/Psych: Anxious/worried.  Soft-spoken.  Tremors in her hands not asterixis.  Intake/Output from previous day: 11/21 0701 - 11/22 0700 In: 1283.2 [I.V.:1183.2; IV Piggyback:100] Out: 1 [Urine:1]  Intake/Output this shift: No intake/output data recorded.  Lab Results: Recent Labs    03/15/21 0306 03/16/21 0353 03/17/21 0208  WBC 6.7 5.2 4.7  HGB 12.1 12.4 12.8  HCT 35.6* 36.5 38.4  PLT 86* 87* 95*   BMET Recent Labs    03/15/21 1709 03/16/21 0353 03/16/21 0917 03/17/21 0208  NA 143 143  --  140  K 2.7* 2.9* 3.5 2.9*  CL 109 110  --  107  CO2 23 24  --  25  GLUCOSE 94 101*  --  117*  BUN 12 9  --  7*  CREATININE 0.79 0.73  --  0.64  CALCIUM 8.0* 7.6*  --  7.4*   LFT Recent Labs    03/15/21 0306 03/17/21 0208  PROT 6.1* 6.0*  ALBUMIN 3.2* 2.9*  AST 159* 86*  ALT 55* 44  ALKPHOS 77 79  BILITOT 1.4* 1.0  BILIDIR  --  0.2  IBILI  --  0.8   PT/INR No results for  input(s): LABPROT, INR in the last 72 hours. Hepatitis Panel No results for input(s): HEPBSAG, HCVAB, HEPAIGM, HEPBIGM in the last 72 hours.  Studies/Results: No results found.  Scheduled Meds:  folic acid  1 mg Oral Daily   multivitamin with minerals  1 tablet Oral Daily   potassium chloride  40 mEq Oral Once   rivaroxaban  10 mg Oral Daily   Continuous Infusions: PRN Meds:.albuterol, clonazePAM, ondansetron (ZOFRAN) IV, oxyCODONE   ASSESMENT:     Pancreatitis.  ETOH, +/- GB stones.  Renal function not deranged    ETOH hepatitis.  Fatty liver.      Sigmoid Colon polyp/mass.  08/2020 Colonoscopy w multiple large adenomatous polyps, partially obstructing sigmoid tumor (TVA w/O HGD).   Seen outpt by gen surg but apparently needed PCP to clear her for surgery.   No fup since.  Currently visible but not obstructing by CT    CGE.  Pattern of AM vomiting.  11/19 EGD: HH, mild gastritis, no active bleeding.      ETOH abuse, polysubstance abuse,  depression.  Attempted suicide (Clonazepam),  cutting behavior 10/15.  No psych meds at discharge, prior Celexa.      Xarelto in use,  new, as DVT prophylaxis.      Diarrhea.  C diff ordered 2 d ago.      Hypocalcemia.     PLAN   Surgery will plan fup at office.  Would not operate in setting pancreatitis.      Stopped orders for c diff and contact precautions.  Leave all other orders in place.       ? Add non-benzo Psych meds? She od'd on Clonazepam a month ago so this is not a wise mgt strategy in outpt setting, especially given hx etoh and polysub abuse.        Azucena Freed  03/17/2021, 9:27 AM Phone [phone]

## 2021-03-17 NOTE — Evaluation (Signed)
Physical Therapy Evaluation Patient Details Name: Debra Jackson MRN: [id number] DOB: 04-17-1956 Today's Date: 03/17/2021  History of Present Illness  Pt is a 65 y/o female admitted secondary to nausea/vomiting and abdominal pain. Found to have acute pancreatitis. Pt is s/p EGD on 11/19. PH includes opiate abuse, polysubstance abuse, alcohol abuse, and colon mass.  Clinical Impression  Pt admitted secondary to problem above with deficits below. Pt requiring min guard A for mobility tasks this session. Limited secondary to abdominal pain and fatigue. Recommending HHPT at d/c to address current deficits. Will continue to follow acutely.        Recommendations for follow up therapy are one component of a multi-disciplinary discharge planning process, led by the attending physician.  Recommendations may be updated based on patient status, additional functional criteria and insurance authorization.  Follow Up Recommendations Home health PT    Assistance Recommended at Discharge Intermittent Supervision/Assistance  Functional Status Assessment Patient has had a recent decline in their functional status and demonstrates the ability to make significant improvements in function in a reasonable and predictable amount of time.  Equipment Recommendations  Rolling walker (2 wheels)    Recommendations for Other Services       Precautions / Restrictions Precautions Precautions: Fall Restrictions Weight Bearing Restrictions: No      Mobility  Bed Mobility Overal bed mobility: Needs Assistance Bed Mobility: Supine to Sit;Sit to Supine     Supine to sit: Min guard Sit to supine: Supervision   General bed mobility comments: Min guard for safety to come to sitting.    Transfers Overall transfer level: Needs assistance Equipment used: None Transfers: Sit to/from Stand Sit to Stand: Min guard           General transfer comment: Min guard for safety.     Ambulation/Gait Ambulation/Gait assistance: Min guard Gait Distance (Feet): 30 Feet Assistive device: 1 person hand held assist Gait Pattern/deviations: Step-through pattern;Decreased stride length Gait velocity: Decreased     General Gait Details: Very slow, cautious gait. Min guard for safety. Pt reports pain in abdomen and that she felt very weak. Educated about using RW for increased support  Financial trader Rankin (Stroke Patients Only)       Balance Overall balance assessment: Needs assistance Sitting-balance support: No upper extremity supported;Feet supported Sitting balance-Leahy Scale: Good     Standing balance support: Single extremity supported;No upper extremity supported Standing balance-Leahy Scale: Fair Standing balance comment: Able to maintain static standing without UE support                             Pertinent Vitals/Pain Pain Assessment: Faces Faces Pain Scale: Hurts little more Pain Location: R upper abdomen Pain Descriptors / Indicators: Guarding Pain Intervention(s): Limited activity within patient's tolerance;Monitored during session;Repositioned    Home Living Family/patient expects to be discharged to:: Private residence Living Arrangements: Spouse/significant other Available Help at Discharge: Family Type of Home: Apartment Home Access: Level entry       Home Layout: One level Home Equipment: None      Prior Function Prior Level of Function : Independent/Modified Independent                     Hand Dominance        Extremity/Trunk Assessment   Upper Extremity Assessment Upper Extremity Assessment: Defer to  OT evaluation    Lower Extremity Assessment Lower Extremity Assessment: Generalized weakness    Cervical / Trunk Assessment Cervical / Trunk Assessment: Normal  Communication   Communication: No difficulties  Cognition Arousal/Alertness:  Awake/alert Behavior During Therapy: Anxious Overall Cognitive Status: Impaired/Different from baseline Area of Impairment: Problem solving                             Problem Solving: Slow processing General Comments: Pt very anxious about going home        General Comments General comments (skin integrity, edema, etc.): Pt's husband present    Exercises     Assessment/Plan    PT Assessment Patient needs continued PT services  PT Problem List Decreased strength;Decreased activity tolerance;Decreased balance;Decreased mobility;Decreased knowledge of use of DME;Decreased knowledge of precautions;Decreased safety awareness       PT Treatment Interventions DME instruction;Gait training;Functional mobility training;Therapeutic activities;Therapeutic exercise;Balance training;Patient/family education    PT Goals (Current goals can be found in the Care Plan section)  Acute Rehab PT Goals Patient Stated Goal: to get stronger PT Goal Formulation: With patient/family Time For Goal Achievement: 03/31/21 Potential to Achieve Goals: Good    Frequency Min 3X/week   Barriers to discharge        Co-evaluation               AM-PAC PT "6 Clicks" Mobility  Outcome Measure Help needed turning from your back to your side while in a flat bed without using bedrails?: None Help needed moving from lying on your back to sitting on the side of a flat bed without using bedrails?: A Little Help needed moving to and from a bed to a chair (including a wheelchair)?: A Little Help needed standing up from a chair using your arms (e.g., wheelchair or bedside chair)?: A Little Help needed to walk in hospital room?: A Little Help needed climbing 3-5 steps with a railing? : A Lot 6 Click Score: 18    End of Session Equipment Utilized During Treatment: Gait belt Activity Tolerance: Patient limited by fatigue Patient left: in bed;with call bell/phone within reach;with family/visitor  present Nurse Communication: Mobility status;Other (comment) (Pt requesting bath and sheets changed; pt's husband requesting to speak with MD, notified MD as well) PT Visit Diagnosis: Unsteadiness on feet (R26.81);Muscle weakness (generalized) (M62.81)    Time: 0539-7673 PT Time Calculation (min) (ACUTE ONLY): 25 min   Charges:   PT Evaluation $PT Eval Moderate Complexity: 1 Mod PT Treatments $Gait Training: 8-22 mins        Lou Miner, DPT  Acute Rehabilitation Services  Pager: [phone] Office: [phone]   Rudean Hitt 03/17/2021, 11:48 AM

## 2021-03-17 NOTE — Care Management Important Message (Signed)
Important Message  Patient Details  Name: Debra Jackson MRN: [id number] Date of Birth: 16-Oct-1955   Medicare Important Message Given:  Yes     Joetta Manners 03/17/2021, 11:58 AM

## 2021-03-17 NOTE — Progress Notes (Addendum)
HD#4 SUBJECTIVE:  Patient Summary: Debra Jackson is a 65 year old female with a PMHx of GERD, depression, anxiety, opiate use disorder, and polysubstance use, presenting to the ED with hematemesis, found to have pancreatitis, now with diarrhea and hypokalemia.   Overnight Events: NAEON  Interim History: Patient reports ABD pain and some nausea after drinking warm kcl solution. She has been eating fruit and cottage cheese. She reports no diarrhea over the past 24 hrs. She complains of urinating frequently over the past day.   OBJECTIVE:  Vital Signs: Vitals:   03/16/21 2102 03/17/21 0006 03/17/21 0405 03/17/21 0823  BP: 129/71 (!) 157/96 129/76 138/85  Pulse: 68 81 73 72  Resp: 15 16 16 17   Temp: 98.5 F (36.9 C) 98.4 F (36.9 C) 97.9 F (36.6 C) 98.1 F (36.7 C)  TempSrc:   Oral Oral  SpO2: 92% 98% 93% 95%  Weight:      Height:          Intake/Output Summary (Last 24 hours) at 03/17/2021 1139 Last data filed at 03/16/2021 2329 Gross per 24 hour  Intake 1283.18 ml  Output --  Net 1283.18 ml   Net IO Since Admission: 9,931.69 mL [03/17/21 1139]  Physical Exam: Physical Exam Vitals reviewed.  Cardiovascular:     Rate and Rhythm: Normal rate and regular rhythm.     Pulses: Normal pulses.     Heart sounds: No murmur heard.    Comments: No lower extremity edema Pulmonary:     Effort: Pulmonary effort is normal.     Breath sounds: Normal breath sounds.  Abdominal:     General: Bowel sounds are normal.     Palpations: Abdomen is soft.     Comments: Mildly TTP on the right side of the abdomen.   Neurological:     Mental Status: She is alert.    Patient Lines/Drains/Airways Status     Active Line/Drains/Airways     Name Placement date Placement time Site Days   Peripheral IV 03/15/21 20 G 1" Left;Posterior Hand 03/15/21  1629  Hand  2             ASSESSMENT/PLAN:  Assessment: Debra Jackson is a 66 year old female with a PMHx of GERD, depression,  anxiety, opiate use disorder, and polysubstance use, presenting to the ED with hematemesis, found to have pancreatitis, now with diarrhea and hypokalemia.    Plan: #Diarrhea #Hypokalemia Patient denies diarrhea over the past 24 hours. C. Diff and GI panel not collected. K low at 2.9 this morning, plan to replace with IV Kcl given aversion to oral.  -IV Kcl 10 meq x6 runs -Recheck K at 6 PM -No IVF given patient taking in PO and lack of continued diarrhea -D/C contact precautions -BMP and mag tomorrow  #Hematemesis #Pancreatitis Patient reports ABD pain but this appears to be due to drinking oral potassium solution, not a regression of her pancreatitis. Remains afebrile, ABD mildly TTP, taking in PO.  -GI following, appreciate recs -Heart healthy diet -No mIVF  #Alcohol use disorder #Benzo dependence Last CIWA of 3. Patient does not appear to be in withdrawal. Klonopin was started back put patient previously overdosed on this medication. Will scale back and D/C.  -Klonopin 0.5 mg daily PRN x1 day, then D/C -Thiamine/folate supplement -Discussed need for abstinence from alcohol -Consider Naltrexone/acamprosate   #Transaminitis AST/ALT of 86/44. Anticipate full resolution of likely alcoholic hepatitis.  -Recheck CMP tomorrow  #Thrombocytopenia Stable at 95 this morning. Etiology  may be 2/2 to alcohol use/liver disease (patient is Child Pugh A however).  -Daily CBC  # Known sigmoid tubulovillous adenoma General surgery recommending outpatient follow up with colorectal surgery. Follow up info in AVS.  -Discuss with patient and family   Full code DVT: Xarelto  Diet: heart healthy IVF: NA  Signature: Debra Sox, MD PGY-1 Pager: [phone]

## 2021-03-18 DIAGNOSIS — K6389 Other specified diseases of intestine: Secondary | ICD-10-CM | POA: Diagnosis not present

## 2021-03-18 DIAGNOSIS — K8500 Idiopathic acute pancreatitis without necrosis or infection: Secondary | ICD-10-CM | POA: Diagnosis not present

## 2021-03-18 DIAGNOSIS — R197 Diarrhea, unspecified: Secondary | ICD-10-CM | POA: Diagnosis not present

## 2021-03-18 LAB — CBC WITH DIFFERENTIAL/PLATELET
Abs Immature Granulocytes: 0.08 10*3/uL — ABNORMAL HIGH (ref 0.00–0.07)
Basophils Absolute: 0 10*3/uL (ref 0.0–0.1)
Basophils Relative: 1 %
Eosinophils Absolute: 0.2 10*3/uL (ref 0.0–0.5)
Eosinophils Relative: 3 %
HCT: 39.5 % (ref 36.0–46.0)
Hemoglobin: 13.5 g/dL (ref 12.0–15.0)
Immature Granulocytes: 1 %
Lymphocytes Relative: 14 %
Lymphs Abs: 0.8 10*3/uL (ref 0.7–4.0)
MCH: 33.3 pg (ref 26.0–34.0)
MCHC: 34.2 g/dL (ref 30.0–36.0)
MCV: 97.5 fL (ref 80.0–100.0)
Monocytes Absolute: 0.7 10*3/uL (ref 0.1–1.0)
Monocytes Relative: 11 %
Neutro Abs: 4.3 10*3/uL (ref 1.7–7.7)
Neutrophils Relative %: 70 %
Platelets: 113 10*3/uL — ABNORMAL LOW (ref 150–400)
RBC: 4.05 MIL/uL (ref 3.87–5.11)
RDW: 15.1 % (ref 11.5–15.5)
WBC: 6.1 10*3/uL (ref 4.0–10.5)
nRBC: 0 % (ref 0.0–0.2)

## 2021-03-18 LAB — COMPREHENSIVE METABOLIC PANEL
ALT: 38 U/L (ref 0–44)
AST: 64 U/L — ABNORMAL HIGH (ref 15–41)
Albumin: 2.9 g/dL — ABNORMAL LOW (ref 3.5–5.0)
Alkaline Phosphatase: 73 U/L (ref 38–126)
Anion gap: 9 (ref 5–15)
BUN: 9 mg/dL (ref 8–23)
CO2: 24 mmol/L (ref 22–32)
Calcium: 8 mg/dL — ABNORMAL LOW (ref 8.9–10.3)
Chloride: 106 mmol/L (ref 98–111)
Creatinine, Ser: 0.64 mg/dL (ref 0.44–1.00)
GFR, Estimated: >60 mL/min (ref >60)
Glucose, Bld: 117 mg/dL — ABNORMAL HIGH (ref 70–99)
Potassium: 3 mmol/L — ABNORMAL LOW (ref 3.5–5.1)
Sodium: 139 mmol/L (ref 135–145)
Total Bilirubin: 0.7 mg/dL (ref 0.3–1.2)
Total Protein: 5.9 g/dL — ABNORMAL LOW (ref 6.5–8.1)

## 2021-03-18 LAB — GLUCOSE, CAPILLARY
Glucose-Capillary: 115 mg/dL — ABNORMAL HIGH (ref 70–99)
Glucose-Capillary: 117 mg/dL — ABNORMAL HIGH (ref 70–99)
Glucose-Capillary: 132 mg/dL — ABNORMAL HIGH (ref 70–99)
Glucose-Capillary: 133 mg/dL — ABNORMAL HIGH (ref 70–99)

## 2021-03-18 LAB — MAGNESIUM: Magnesium: 1.6 mg/dL — ABNORMAL LOW (ref 1.7–2.4)

## 2021-03-18 LAB — PHOSPHORUS: Phosphorus: 3.2 mg/dL (ref 2.5–4.6)

## 2021-03-18 LAB — POTASSIUM: Potassium: 3.9 mmol/L (ref 3.5–5.1)

## 2021-03-18 MED ORDER — POTASSIUM CHLORIDE 20 MEQ PO PACK: 20.0000 meq | PACK | Freq: Once | ORAL | Status: DC

## 2021-03-18 MED ORDER — POTASSIUM CHLORIDE 10 MEQ/100ML IV SOLN
10.0000 meq | INTRAVENOUS | Status: DC
  Filled 2021-03-18: qty 100

## 2021-03-18 MED ORDER — POTASSIUM CHLORIDE 20 MEQ PO PACK: 40.0000 meq | PACK | Freq: Two times a day (BID) | ORAL | Status: DC

## 2021-03-18 MED ORDER — POTASSIUM CHLORIDE 20 MEQ PO PACK
40.0000 meq | PACK | Freq: Once | ORAL | Status: AC
  Administered 2021-03-18: 40 meq via ORAL
  Filled 2021-03-18: qty 2

## 2021-03-18 NOTE — Discharge Instructions (Signed)
You were hospitalized for inflammation of your pancreas caused by excessive alcohol use. Your labs and imaging also noted advancing liver disease, which requires that you dramatically reduce or abstain from alcohol intake. It will be important to follow up with your PCP for this. You remained in the hospital an extra day due to your potassium being low. This electrolyte was replaced and determined to be normal before you left. It will be good to eat potassium rich foods at home, such as broccoli, potatoes, and bananas in the subsequent days. You should follow up with your PCP (assigned in attached paperwork, [facility]) to have a potassium level rechecked in 7 days. It will also be important to obtain a pre-operative evaluation from this PCP for removal of your colonic mass. Please follow up with [facility] at the number attached elsewhere.

## 2021-03-18 NOTE — Progress Notes (Signed)
Physical Therapy Treatment Patient Details Name: Debra Jackson MRN: [id number] DOB: 09-26-1955 Today's Date: 03/18/2021   History of Present Illness Pt is a 65 y/o female admitted secondary to nausea/vomiting and abdominal pain. Found to have acute pancreatitis. Pt is s/p EGD on 11/19. PH includes opiate abuse, polysubstance abuse, alcohol abuse, and colon mass.    PT Comments    Pt admitted with above diagnosis. Pt making good progress with incr stability with gait and not needing device. Pt states she feels better each time she is up.  Pt progressing well and will most likely not need HH therapies and shouldn't need a RW.  Pt currently with functional limitations due to endurance deficits. Pt will benefit from skilled PT to increase their independence and safety with mobility to allow discharge to the venue listed below.      Recommendations for follow up therapy are one component of a multi-disciplinary discharge planning process, led by the attending physician.  Recommendations may be updated based on patient status, additional functional criteria and insurance authorization.  Follow Up Recommendations  Home health PT (may progress and not need HHPT)     Assistance Recommended at Discharge Set up Supervision/Assistance  Equipment Recommendations  None recommended by PT    Recommendations for Other Services       Precautions / Restrictions Precautions Precautions: Fall Restrictions Weight Bearing Restrictions: No     Mobility  Bed Mobility Overal bed mobility: Modified Independent Bed Mobility: Supine to Sit;Sit to Supine     Supine to sit: Independent     General bed mobility comments: Pt initially refusing to work with PT but convinced pt to take a walk.    Transfers Overall transfer level: Needs assistance Equipment used: None Transfers: Sit to/from Stand Sit to Stand: Independent                Ambulation/Gait Ambulation/Gait assistance:  Supervision Gait Distance (Feet): 250 Feet Assistive device: None Gait Pattern/deviations: Step-through pattern;Decreased stride length Gait velocity: Decreased Gait velocity interpretation: <1.31 ft/sec, indicative of household ambulator   General Gait Details: Pt with much better gait today and no LOB with min challenges to balance. Pt reports she wants to wash up as she feels dirty. Got all the linens and pt washed herself with PT helping with her back and gown change.   Stairs             Wheelchair Mobility    Modified Rankin (Stroke Patients Only)       Balance Overall balance assessment: Needs assistance Sitting-balance support: No upper extremity supported;Feet supported Sitting balance-Leahy Scale: Good     Standing balance support: Single extremity supported;No upper extremity supported Standing balance-Leahy Scale: Fair Standing balance comment: Able to maintain static standing without UE support                            Cognition Arousal/Alertness: Awake/alert Behavior During Therapy: Anxious Overall Cognitive Status: Impaired/Different from baseline Area of Impairment: Problem solving                             Problem Solving: Slow processing          Exercises      General Comments        Pertinent Vitals/Pain Pain Assessment: Faces Faces Pain Scale: Hurts little more Pain Location: upper abdomen Pain Descriptors / Indicators: Guarding Pain Intervention(s):  Limited activity within patient's tolerance;Monitored during session;Repositioned    Home Living                          Prior Function            PT Goals (current goals can now be found in the care plan section) Acute Rehab PT Goals Patient Stated Goal: to get stronger Progress towards PT goals: Progressing toward goals    Frequency    Min 3X/week      PT Plan Current plan remains appropriate    Co-evaluation               AM-PAC PT "6 Clicks" Mobility   Outcome Measure  Help needed turning from your back to your side while in a flat bed without using bedrails?: None Help needed moving from lying on your back to sitting on the side of a flat bed without using bedrails?: None Help needed moving to and from a bed to a chair (including a wheelchair)?: None Help needed standing up from a chair using your arms (e.g., wheelchair or bedside chair)?: None Help needed to walk in hospital room?: A Little Help needed climbing 3-5 steps with a railing? : A Little 6 Click Score: 22    End of Session Equipment Utilized During Treatment: Gait belt Activity Tolerance: Patient limited by fatigue Patient left: with call bell/phone within reach;in chair Nurse Communication: Mobility status;Other (comment) (Pt request sheets changed; notified NT who came in to change them at end of treatment.) PT Visit Diagnosis: Unsteadiness on feet (R26.81);Muscle weakness (generalized) (M62.81)     Time: 8453-6468 PT Time Calculation (min) (ACUTE ONLY): 23 min  Charges:  $Gait Training: 8-22 mins $Therapeutic Activity: 8-22 mins                     Cypress Hinkson M,PT Acute Rehab Services [phone] (pager)    Alvira Philips 03/18/2021, 12:33 PM

## 2021-03-18 NOTE — Progress Notes (Signed)
Per on call MD, potassium will be addressed on day shift.

## 2021-03-18 NOTE — Discharge Summary (Signed)
Name: Debra Jackson MRN: [id number] DOB: March 11, 1956 65 y.o. PCP: Patient, No Pcp Per (Inactive)  Date of Admission: 03/13/2021  9:50 AM Date of Discharge: 03/18/2021 Attending Physician: Velna Ochs, MD  Discharge Diagnosis: 1. Pancreatitis 2. Hematemesis 3. Alcohol use disorder 4. Benzodiazepine dependence 5. AKI (resolved) 6. Alcoholic hepatitis 7. Tubulovillous adenoma 8. Thrombmocytopenia  Discharge Medications: Allergies as of 03/18/2021       Reactions   Penicillins Nausea And Vomiting        Medication List     STOP taking these medications    ibuprofen 200 MG tablet Commonly known as: ADVIL       TAKE these medications    citalopram 20 MG tablet Commonly known as: CELEXA Take 20 mg by mouth at bedtime.        Disposition and follow-up:   Ms.Debra Jackson was discharged from [facility] in Stable condition.  At the hospital follow up visit please address:  1.  Follow up potassium level with BMP.  2. Ensure pre-op evaluation is completed for resection of tubulovillous adenoma.   3. Regular monitoring for cirrhosis, including blood work and physical exam.   4.  Labs / imaging needed at time of follow-up: BMP  5.  Pending labs/ test needing follow-up: NA  Follow-up Appointments:  Follow-up Information     [facility], [place] Follow up.   Specialty: General Surgery Why: If your GI doctor recommends follow up with a surgeon based on your colonoscopy results, please call our office and schedule an appointment with one of our Colorectal surgeons (Dr. Marcello Moores, Dr. Dema Severin, or Dr. Johney Maine). Contact information: [address] [place] [address] [phone]         [facility] Follow up on 03/26/2021.   Why: Post hospitsl follow up scheduled for 03/26/2021 at 2:30 with Dedra Skeens PA Contact information: [place]  [id number] [place] Hospital Course by problem list: 1. Pancreatitis, likely 2/2 alcohol Patient presented with a several day history of nausea and vomiting as well as epigastric pain. On the day of admission, the patient developed hematemesis. On admission, her lipase was 440 and her CT A/P demonstrated findings of pancreatitis. RUQ ultrasound was negative for common bile duct dilation but was notable for a 1.5 cm gallstone. It was felt that her hematemesis was due to mallory weiss tears that developed due to vomiting. She was fluid resuscitated with subsequent improvement in pain. She was able to tolerate a regular diet quickly.   2. Hematemesis Investigated with EGD which was unremarkable for source of bleeding. Etiology suspected as above. Hgb was stable WNL throughout hospitalization.   3. Hypokalemia Developed later in hospitalization. Was treated appropriately with oral and IV replacement. Thought to be secondary to diarrhea. Patient discharged with instruction to follow up with PCP within 1 week for repeat BMP.   4. Alcohol use disorder CIWAs consistently <8. No PRN ativan needed.   5. Benzodiazepine dependence Home Klonopin PRN was discontinued given previous overdose (suicide attempt) on this medication.  6. AKI (resolved) Mild, improved with fluids.   7. Alcoholic hepatitis LFTs up to low 100's. Given timecourse and nearly full resolution, suspect alcoholic hepatitis. Patient was noted to have fatty liver on CT A/P. Acute hepatitis panel was negative for Hep C/A/B viruses.   8. Thrombocytopenia Stable throughout admission  at 90's to 100's. Likely secondary to alcohol use and liver dysfunction.   9. Tubulovillous adenoma Large colonic mass that may be harboring cancer (per GI). Never removed despite referral to colorectal surgery. Patient did not manage to follow up with PCP. Diarrhea may be attributable to this. Talked with general surgery twice who  declined to intervene or see patient given lack of colorectal surgeon on call and active pancreatitis. Of note GI suggested a potential simultaneous cholecystectomy.   Discharge Exam:   BP 109/86 (BP Location: Right Arm)   Pulse 84   Temp 97.8 F (36.6 C) (Oral)   Resp 18   Ht 5\' 3"  (1.6 m)   Wt 86.4 kg   SpO2 98%   BMI 33.74 kg/m  Discharge exam:  Physical Exam Vitals reviewed.  Cardiovascular:     Rate and Rhythm: Normal rate and regular rhythm.     Pulses: Normal pulses.     Heart sounds: No murmur heard. Pulmonary:     Effort: Pulmonary effort is normal.     Breath sounds: Normal breath sounds.  Abdominal:     General: Bowel sounds are normal.     Palpations: Abdomen is soft.     Tenderness: There is no abdominal tenderness.  Neurological:     Mental Status: She is alert.     Pertinent Labs, Studies, and Procedures:  CT A/P with pancreatitis and fatty liver EGD with no source of bleeding  Discharge Instructions: Discharge Instructions     Diet - low sodium heart healthy   Complete by: As directed    Increase activity slowly   Complete by: As directed      You were hospitalized for inflammation of your pancreas caused by excessive alcohol use. Your labs and imaging also noted advancing liver disease, which requires that you dramatically reduce or abstain from alcohol intake. It will be important to follow up with your PCP for this. You remained in the hospital an extra day due to your potassium being low. This electrolyte was replaced and determined to be normal before you left. It will be good to eat potassium rich foods at home, such as broccoli, potatoes, and bananas in the subsequent days. You should follow up with your PCP (assigned in attached paperwork, [facility]) to have a potassium level rechecked in 7 days. It will also be important to obtain a pre-operative evaluation from this PCP for removal of your colonic mass. Please follow up  with [facility] at the number attached elsewhere.    Signed: Marianna Payment, MD 03/18/2021, 5:03 PM

## 2021-03-18 NOTE — Progress Notes (Signed)
Mobility Specialist Progress Note   03/18/21 1641  Mobility  Activity Ambulated in hall  Level of Assistance Independent after set-up  Assistive Device None  Distance Ambulated (ft) 550 ft  Mobility Ambulated independently in hallway  Mobility Response Tolerated well  Mobility performed by Mobility specialist  $Mobility charge 1 Mobility   Received pt in bed having no complaints and agreeable to mobility. Asymptomatic throughout ambulation, returned back to bed w/ call bell by side and all needs met.  Holland Falling Mobility Specialist Phone Number [phone]

## 2021-03-18 NOTE — Progress Notes (Signed)
Mobility Specialist Progress Note   03/18/21 1200  Mobility  Activity Refused mobility   Just had a session w/ PT and requested for me to come back in the afternoon. Will follow up again around 3p.  Holland Falling Mobility Specialist Phone Number [phone]

## 2021-03-26 ENCOUNTER — Inpatient Hospital Stay: Payer: Medicare Other | Admitting: Physician Assistant

## 2021-04-01 ENCOUNTER — Inpatient Hospital Stay: Payer: Medicare Other | Admitting: Physician Assistant

## 2021-05-12 ENCOUNTER — Encounter: Payer: Self-pay | Admitting: Internal Medicine

## 2021-05-12 ENCOUNTER — Ambulatory Visit: Payer: Medicare Other | Attending: Physician Assistant | Admitting: Internal Medicine

## 2021-05-12 ENCOUNTER — Other Ambulatory Visit: Payer: Self-pay

## 2021-05-12 VITALS — BP 141/91 | HR 100 | Resp 16 | Ht 61.0 in | Wt 184.2 lb

## 2021-05-12 DIAGNOSIS — Z2821 Immunization not carried out because of patient refusal: Secondary | ICD-10-CM

## 2021-05-12 DIAGNOSIS — F320 Major depressive disorder, single episode, mild: Secondary | ICD-10-CM

## 2021-05-12 DIAGNOSIS — Z8719 Personal history of other diseases of the digestive system: Secondary | ICD-10-CM

## 2021-05-12 DIAGNOSIS — Z7689 Persons encountering health services in other specified circumstances: Secondary | ICD-10-CM | POA: Diagnosis not present

## 2021-05-12 DIAGNOSIS — K6389 Other specified diseases of intestine: Secondary | ICD-10-CM | POA: Diagnosis not present

## 2021-05-12 DIAGNOSIS — R7989 Other specified abnormal findings of blood chemistry: Secondary | ICD-10-CM | POA: Diagnosis not present

## 2021-05-12 DIAGNOSIS — F1020 Alcohol dependence, uncomplicated: Secondary | ICD-10-CM | POA: Diagnosis not present

## 2021-05-12 DIAGNOSIS — Z1231 Encounter for screening mammogram for malignant neoplasm of breast: Secondary | ICD-10-CM

## 2021-05-12 DIAGNOSIS — D696 Thrombocytopenia, unspecified: Secondary | ICD-10-CM
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
## 2021-05-13 ENCOUNTER — Telehealth: Payer: Self-pay

## 2021-05-13 LAB — COMPREHENSIVE METABOLIC PANEL
ALT: 18 IU/L (ref 0–32)
AST: 44 IU/L — ABNORMAL HIGH (ref 0–40)
Albumin/Globulin Ratio: 1.1 — ABNORMAL LOW (ref 1.2–2.2)
Albumin: 3.8 g/dL (ref 3.8–4.8)
Alkaline Phosphatase: 148 IU/L — ABNORMAL HIGH (ref 44–121)
BUN/Creatinine Ratio: 19 (ref 12–28)
BUN: 13 mg/dL (ref 8–27)
Bilirubin Total: 0.3 mg/dL (ref 0.0–1.2)
CO2: 24 mmol/L (ref 20–29)
Calcium: 9.8 mg/dL (ref 8.7–10.3)
Chloride: 99 mmol/L (ref 96–106)
Creatinine, Ser: 0.7 mg/dL (ref 0.57–1.00)
Globulin, Total: 3.5 g/dL (ref 1.5–4.5)
Glucose: 89 mg/dL (ref 70–99)
Potassium: 3.7 mmol/L (ref 3.5–5.2)
Sodium: 138 mmol/L (ref 134–144)
Total Protein: 7.3 g/dL (ref 6.0–8.5)
eGFR: 95 mL/min/{1.73_m2} (ref >59)

## 2021-05-13 LAB — CBC
Hematocrit: 41.3 % (ref 34.0–46.6)
Hemoglobin: 14.1 g/dL (ref 11.1–15.9)
MCH: 31.4 pg (ref 26.6–33.0)
MCHC: 34.1 g/dL (ref 31.5–35.7)
MCV: 92 fL (ref 79–97)
Platelets: 262 10*3/uL (ref 150–450)
RBC: 4.49 x10E6/uL (ref 3.77–5.28)
RDW: 15.1 % (ref 11.7–15.4)
WBC: 10.9 10*3/uL — ABNORMAL HIGH (ref 3.4–10.8)

## 2021-05-13 NOTE — Telephone Encounter (Signed)
Contacted pt to go over lab results pt didn't answer tried both numbers and was unable to reach pt   Sent a CRM and forward labs to NT to give pt labs when they call back

## 2021-05-18 ENCOUNTER — Telehealth: Payer: Self-pay | Admitting: Internal Medicine

## 2021-05-18 NOTE — Telephone Encounter (Signed)
Pt is returning the office call for lab results.

## 2021-05-18 NOTE — Telephone Encounter (Signed)
Already received, see result notes.

## 2021-06-16 ENCOUNTER — Telehealth: Payer: Self-pay

## 2021-06-16 NOTE — Telephone Encounter (Signed)
Copied from [place] [phone]. Topic: General - Other >> Jun 16, 2021 10:04 AM Bayard Beaver wrote: Reason for CRM:Pt called in states ws told by Dr Wynetta Emery that she would prescribe her the citalopram (CELEXA) 20 MG tablet , since she isnt seeing previus Dr that prescriubed it before. Please call back

## 2021-06-16 NOTE — Telephone Encounter (Signed)
Will forward to provider  

## 2021-06-17 MED ORDER — CITALOPRAM HYDROBROMIDE 20 MG PO TABS
20.0000 mg | ORAL_TABLET | Freq: Every day | ORAL | 6 refills | Status: DC
Start: 2021-06-17 — End: 2021-12-16

## 2021-06-17 NOTE — Addendum Note (Signed)
Addended by: Karle Plumber B on: 06/17/2021 08:08 AM   Modules accepted: Orders

## 2021-06-18 NOTE — Telephone Encounter (Signed)
Tried reaching out to pt to make aware of medication being sent pt didn't answer and was unable to lvm phone kept ringing

## 2021-06-28 ENCOUNTER — Emergency Department (HOSPITAL_BASED_OUTPATIENT_CLINIC_OR_DEPARTMENT_OTHER)
Admission: EM | Admit: 2021-06-28 | Discharge: 2021-06-28 | Disposition: A | Discharge status: Home / Self Care | Payer: Medicare Other | Attending: Emergency Medicine | Admitting: Emergency Medicine

## 2021-06-28 ENCOUNTER — Emergency Department (HOSPITAL_BASED_OUTPATIENT_CLINIC_OR_DEPARTMENT_OTHER): Payer: Medicare Other

## 2021-06-28 ENCOUNTER — Encounter (HOSPITAL_BASED_OUTPATIENT_CLINIC_OR_DEPARTMENT_OTHER): Payer: Self-pay | Admitting: Emergency Medicine

## 2021-06-28 ENCOUNTER — Other Ambulatory Visit: Payer: Self-pay

## 2021-06-28 DIAGNOSIS — S42252A Displaced fracture of greater tuberosity of left humerus, initial encounter for closed fracture: Secondary | ICD-10-CM | POA: Diagnosis not present

## 2021-06-28 DIAGNOSIS — M25512 Pain in left shoulder: Secondary | ICD-10-CM | POA: Insufficient documentation

## 2021-06-28 DIAGNOSIS — S4992XA Unspecified injury of left shoulder and upper arm, initial encounter: Secondary | ICD-10-CM | POA: Diagnosis present

## 2021-06-28 DIAGNOSIS — Y93K1 Activity, walking an animal: Secondary | ICD-10-CM | POA: Diagnosis not present

## 2021-06-28 DIAGNOSIS — S20212A Contusion of left front wall of thorax, initial encounter: Secondary | ICD-10-CM | POA: Insufficient documentation

## 2021-06-28 DIAGNOSIS — S42212A Unspecified displaced fracture of surgical neck of left humerus, initial encounter for closed fracture: Secondary | ICD-10-CM

## 2021-06-28 DIAGNOSIS — W010XXA Fall on same level from slipping, tripping and stumbling without subsequent striking against object, initial encounter: Secondary | ICD-10-CM | POA: Insufficient documentation

## 2021-06-28 DIAGNOSIS — S0081XA Abrasion of other part of head, initial encounter: Secondary | ICD-10-CM | POA: Insufficient documentation

## 2021-06-28 MED ORDER — OXYCODONE-ACETAMINOPHEN 5-325 MG PO TABS
1.0000 | ORAL_TABLET | Freq: Four times a day (QID) | ORAL | 0 refills | Status: DC | PRN
Start: 2021-06-28 — End: 2021-08-10

## 2021-06-28 MED ORDER — OXYCODONE-ACETAMINOPHEN 5-325 MG PO TABS
1.0000 | ORAL_TABLET | Freq: Once | ORAL | Status: AC
  Administered 2021-06-28: 1 via ORAL
  Filled 2021-06-28: qty 1

## 2021-06-28 NOTE — ED Triage Notes (Signed)
Pt arrives pov, ambulatory to triage, c/o mechanical fall when walking dog 3 days pta, and both hands were occupied. Pt c/o left arm pain, bruising noted, ROM decreased. Pt denies loc, denies thinners ?

## 2021-06-28 NOTE — ED Provider Notes (Signed)
[facility] EMERGENCY DEPARTMENT Provider Note   CSN: [id number] Arrival date & time: 06/28/21  1036     History  Chief Complaint  Patient presents with   Debra Jackson    Debra Jackson is a 66 y.o. female.  Past medical history of polysubstance abuse, anxiety, and depression.  Patient states about 3 days ago she had a mechanical fall when she was walking her dog.  She was bending over to pick up the stool when her dog ran off and pulled her.  She ended up being pulled over and landed on her left shoulder as well as her face leaning in a bush.  She denies hitting her head or any loss of consciousness.  She says she did not originally come to the emergency department because she thought that it would improve on its own.  She was unable to move her left shoulder following the event.  She was also unable to take off her close.  When the pain continued, they ended up cutting off her shirt at home and noticed a large bruise on the inside of her left upper arm.  At that point, she presented to the emergency department.  She denies any numbness or tingling in her hands or lower arms.   Fall      Home Medications Prior to Admission medications   Medication Sig Start Date End Date Taking? Authorizing Provider  oxyCODONE-acetaminophen (PERCOCET/ROXICET) 5-325 MG tablet Take 1 tablet by mouth every 6 (six) hours as needed for severe pain. 06/28/21  Yes Joelene Barriere, Adora Fridge, PA-C  citalopram (CELEXA) 20 MG tablet Take 1 tablet (20 mg total) by mouth at bedtime. 06/17/21   Ladell Pier, MD      Allergies    Penicillins    Review of Systems   Review of Systems  Musculoskeletal:  Positive for arthralgias.  Skin:  Positive for color change.  All other systems reviewed and are negative.  Physical Exam Updated Vital Signs BP 140/78    Pulse 99    Temp 98.2 F (36.8 C) (Oral)    Resp 20    Ht '5\' 1"'$  (1.549 m)    SpO2 99%    BMI 34.80 kg/m  Physical Exam Vitals and nursing note reviewed.   Constitutional:      General: She is not in acute distress.    Appearance: Normal appearance. She is well-developed. She is not ill-appearing, toxic-appearing or diaphoretic.  HENT:     Head: Normocephalic and atraumatic.     Nose: No nasal deformity.     Mouth/Throat:     Lips: Pink. No lesions.  Eyes:     General: Gaze aligned appropriately. No scleral icterus.       Right eye: No discharge.        Left eye: No discharge.     Conjunctiva/sclera: Conjunctivae normal.     Right eye: Right conjunctiva is not injected. No exudate or hemorrhage.    Left eye: Left conjunctiva is not injected. No exudate or hemorrhage. Pulmonary:     Effort: Pulmonary effort is normal. No respiratory distress.  Musculoskeletal:     Comments: On exam of the left shoulder, there is no obvious deformity, however there is a large bruising on the medial side of the left upper arm.  She has full grip strength and is able to move her fingers.  She has elbow range of motion and no tenderness to palpation.  She has normal radial pulse that is 2+.  She has good sensation in her lower arm.  Good capillary refill.  She is unable to move her left shoulder.  Skin:    General: Skin is warm and dry.     Comments: There are various scratches on her face.  All seem superficial.  Well-healing.  No evidence of infection.  There is no eye involvement.  Bruising noted on left anterior chest wall, and left upper arm  Neurological:     Mental Status: She is alert and oriented to person, place, and time.  Psychiatric:        Mood and Affect: Mood normal.        Speech: Speech normal.        Behavior: Behavior normal. Behavior is cooperative.    ED Results / Procedures / Treatments   Labs (all labs ordered are listed, but only abnormal results are displayed) Labs Reviewed - No data to display  EKG None  Radiology DG Chest 2 View  Result Date: 06/28/2021 CLINICAL DATA:  66 year old female with history of trauma from a  fall. Bruising. EXAM: CHEST - 2 VIEW COMPARISON:  Chest x-ray 03/13/2021. FINDINGS: Chronic elevation of the right hemidiaphragm again noted. Lung volumes are normal. No consolidative airspace disease. No pleural effusions. No pneumothorax. No pulmonary nodule or mass noted. Large hiatal hernia. Pulmonary vasculature and the cardiomediastinal silhouette are otherwise within normal limits. IMPRESSION: 1. No radiographic evidence of acute cardiopulmonary disease. 2. Chronic elevation of the right hemidiaphragm. 3. Large hiatal hernia. Electronically Signed   By: Vinnie Langton M.D.   On: 06/28/2021 11:49   CT SHOULDER LEFT WO CONTRAST  Result Date: 06/28/2021 CLINICAL DATA:  Status post fall, left arm pain EXAM: CT OF THE UPPER LEFT EXTREMITY WITHOUT CONTRAST TECHNIQUE: Multidetector CT imaging of the upper left extremity was performed according to the standard protocol. RADIATION DOSE REDUCTION: This exam was performed according to the departmental dose-optimization program which includes automated exposure control, adjustment of the mA and/or kV according to patient size and/or use of iterative reconstruction technique. COMPARISON:  None. FINDINGS: Bones/Joint/Cartilage Comminuted fracture of the surgical neck of the left proximal humerus with 9 mm of anterior displacement and dorsal rotation of the humeral head. Comminuted fracture of the left greater tuberosity with 2.4 cm of superior posterolateral displacement. Small fracture fragment along the anterior margin of the glenoid. No other acute fracture or dislocation. Small glenohumeral joint effusion. Mild arthropathy of the acromioclavicular joint. Partially visualized cervical spine spondylosis. Ligaments Ligaments are suboptimally evaluated by CT. Muscles and Tendons Muscles are normal. No muscle atrophy. No intramuscular fluid collection or hematoma. Soft tissue No fluid collection or hematoma.  No soft tissue mass. IMPRESSION: 1. Comminuted fracture of  the surgical neck of the left proximal humerus with 9 mm of anterior displacement and dorsal rotation of the humeral head. Comminuted fracture of the left greater tuberosity with 2.4 cm of superior posterolateral displacement. Small fracture fragment along the anterior margin of the glenoid. Electronically Signed   By: Kathreen Devoid M.D.   On: 06/28/2021 12:43   DG Shoulder Left  Result Date: 06/28/2021 CLINICAL DATA:  Status post fall. EXAM: LEFT SHOULDER - 2+ VIEW COMPARISON:  None. FINDINGS: Comminuted fracture deformity involving the surgical neck and head of humerus. Posterior and medial displacement of the distal fracture fragments noted. IMPRESSION: Comminuted fracture deformity involves the surgical neck and head of the humerus. Electronically Signed   By: Kerby Moors M.D.   On: 06/28/2021 11:52    Procedures  Procedures    Medications Ordered in ED Medications  oxyCODONE-acetaminophen (PERCOCET/ROXICET) 5-325 MG per tablet 1 tablet (1 tablet Oral Given 06/28/21 1224)    ED Course/ Medical Decision Making/ A&P Clinical Course as of 06/28/21 Baldom[place] Lamy Jun 28, 2021  1219 Spoke with Dr. Rolena Infante, Recommends Ct, call back with results to [phone], f/u with Dr. Stann Mainland this week. [GL]    Clinical Course User Index [GL] Sherre Poot Adora Fridge, PA-C                           Medical Decision Making Amount and/or Complexity of Data Reviewed Radiology: ordered.  Risk Prescription drug management.    MDM  This is a 66 y.o. female who presents to the ED with left shoulder pain after a ground level fall while walking dog.   My Impression, Plan, and ED Course: Vitals are stable.  Patient appears well.  There are a few superficial abrasions on her face.  No head injury.  No obvious swelling or indication of facial injury.  Left shoulder is not able to be ranged.  There is a large bruise on the left medial aspect of the left humerus.  There is also bruising on the left chest.  Chest x-ray and  left shoulder x-ray were obtained. I personally ordered, reviewed, and interpreted all laboratory work and imaging and agree with radiologist interpretation. Results interpreted below: Chest x-ray does not reveal any fractures.  Shoulder x-ray reveals comminuted fracture deformity involving the surgical neck head of the humerus.  It is posterior and medial displacement of the distal fracture fragments noted.  Plan to consult orthopedic surgery. Pain meds ordered Spoke with Dr. Rolena Infante.  Recommends CT scan.  We will call him back following results. CT reveals comminuted fracture of the surgical neck of the left proximal humerus with 9 mm of anterior displacement and dorsal rotation of the humeral head.  Comminuted fracture of the left left greater tuberosity with 2.4 cm of superior posterior lateral displacement.  Small fracture fragment along the anterior margin of the glenoid.  No dislocation noted.  I spoke again with Dr. Rolena Infante.  He recommends following up in office this week.  Will place patient in sling for now give pain medications outpatient for comfort..  Charting Requirements Additional history is obtained from:  Independent historian External Records from outside source obtained and reviewed including: n/a Social Determinants of Health:  n/a Pertinant PMH that complicates patient's illness: mental illness, hx of polysubstance abuse  Patient Care Problems that were addressed during this visit: - humerus fracture: Acute illness with complication Medications given in ED: Percocet Reevaluation of the patient after these medicines showed that the patient improved Consultations: Orthopedic surgery Disposition: f/u with ortho  I have discussed this patient with my attending physician, Dr. Regenia Skeeter who has made changes to the plan accordingly.  Portions of this note were generated with [employer]. Dictation errors may occur despite best attempts at proofreading.   Final  Clinical Impression(s) / ED Diagnoses Final diagnoses:  Closed displaced fracture of surgical neck of left humerus, unspecified fracture morphology, initial encounter    Rx / DC Orders ED Discharge Orders          Ordered    oxyCODONE-acetaminophen (PERCOCET/ROXICET) 5-325 MG tablet  Every 6 hours PRN        06/28/21 1305              Kanin Lia, Adora Fridge, PA-C  06/28/21 Kai Levins, MD 06/29/21 1112

## 2021-06-28 NOTE — Discharge Instructions (Signed)
You have a fracture of your humeral head.  You need to call Dr. Stann Mainland to schedule an appointment.  Please call him this first thing in tomorrow morning.  You should be seen in the next week. ?I will give you prescription for pain medication for breakthrough pain.  You can use Tylenol and ibuprofen for mild to moderate pain as needed. ?

## 2021-08-06 NOTE — Progress Notes (Addendum)
Surgical Instructions ? ? ? Your procedure is scheduled on Monday April 17. ? Report to [facility] Main Entrance "A" at 12:30 P.M., then check in with the Admitting office. ? Call this number if you have problems the morning of surgery: ? [phone] ? ? If you have any questions prior to your surgery date call [phone]: Open Monday-Friday 8am-4pm ? ? ? Remember: ? Do not eat after midnight the night before your surgery ? ?You may drink clear liquids until 11:30am the morning of your surgery.   ?Clear liquids allowed are: Water, Non-Citrus Juices (without pulp), Carbonated Beverages, Clear Tea, Black Coffee ONLY (NO MILK, CREAM OR POWDERED CREAMER of any kind), and Gatorade ?  ? Take these medicines the morning of surgery with A SIP OF WATER:  ?oxycodone (OXY-IR)  ? ?As of today, STOP taking any Aspirin (unless otherwise instructed by your surgeon) Aleve, Naproxen, Ibuprofen, Motrin, Advil, Goody's, BC's, all herbal medications, fish oil, and all vitamins. ? ?         ?Do not wear jewelry or makeup ?Do not wear lotions, powders, perfumes/colognes, or deodorant. ?Do not shave 48 hours prior to surgery.  Men may shave face and neck. ?Do not bring valuables to the hospital. ?Do not wear nail polish, gel polish, artificial nails, or any other type of covering on natural nails (fingers and toes) ?If you have artificial nails or gel coating that need to be removed by a nail salon, please have this removed prior to surgery. Artificial nails or gel coating may interfere with anesthesia's ability to adequately monitor your vital signs. ? ?[facility] is not responsible for any belongings or valuables. .  ? ?Do NOT Smoke (Tobacco/Vaping)  24 hours prior to your procedure ? ?If you use a CPAP at night, you may bring your mask for your overnight stay. ?  ?Contacts, glasses, hearing aids, dentures or partials may not be worn into surgery, please bring cases for these belongings ?  ?For patients admitted to the hospital,  discharge time will be determined by your treatment team. ?  ?Patients discharged the day of surgery will not be allowed to drive home, and someone needs to stay with them for 24 hours. ? ? ?SURGICAL WAITING ROOM VISITATION ?Patients having surgery or a procedure in a hospital may have two support people. ?Children under the age of 48 must have an adult with them who is not the patient. ?They may stay in the waiting area during the procedure and may switch out with other visitors. If the patient needs to stay at the hospital during part of their recovery, the visitor guidelines for inpatient rooms apply. ? ?Please refer to the [facility] website for the visitor guidelines for Inpatients (after your surgery is over and you are in a regular room).  ? ? ? ? ?                                 - Preparing for Total Shoulder Arthroplasty  ? ?Before surgery, you can play an important role. Because skin is not sterile, your skin needs to be as free of germs as possible. You can reduce the number of germs on your skin by using the following products. ?Benzoyl Peroxide Gel ?Reduces the number of germs present on the skin ?Applied twice a day to shoulder area starting two days before surgery   ?Chlorhexidine Gluconate (CHG) Soap ?An antiseptic cleaner that  kills germs and bonds with the skin to continue killing germs even after washing ?Used for showering the night before surgery and morning of surgery ?  ?Oral Hygiene is also important to reduce your risk of infection.                                    ?Remember - BRUSH YOUR TEETH THE MORNING OF SURGERY WITH YOUR REGULAR TOOTHPASTE ? ?================================================================== ? ?Please follow these instructions carefully: ? ?BENZOYL PEROXIDE 5% GEL ? ?Please do not use if you have an allergy to benzoyl peroxide.   If your skin becomes reddened/irritated stop using the benzoyl peroxide. ? ?Starting two days before surgery, apply as  follows: ?Apply benzoyl peroxide in the morning and at night. Apply after taking a shower. If you are not taking a shower clean entire shoulder front, back, and side along with the armpit with a clean wet washcloth. ? ?Place a quarter-sized dollop on your shoulder and rub in thoroughly, making sure to cover the front, back, and side of your shoulder, along with the armpit.  ? ?2 days before ____ AM   ____ PM              1 day before ____ AM   ____ PM ? ?                        ? Do this twice a day for two days.  (Last application is the night before surgery, AFTER using the CHG soap as described below). ? ?Do NOT apply benzoyl peroxide gel on the day of surgery. ? ?CHLORHEXIDINE GLUCONATE (CHG) SOAP ? ?Please do not use if you have an allergy to CHG or antibacterial soaps. If your skin becomes reddened/irritated stop using the CHG.  ? ?Do not shave (including legs and underarms) for at least 48 hours prior to first CHG shower. It is OK to shave your face. ? ?Starting the night before surgery, use CHG soap as follows: ? ?Shower the NIGHT BEFORE SURGERY and MORNING OF SURGERY with CHG. ? ?If you choose to wash your hair, wash your hair first as usual with your normal shampoo. ? ?After shampooing, rinse your hair and body thoroughly to remove the shampoo. ? ?Use CHG as you would any other liquid soap.  You can apply CHG directly to the skin and wash gently with a scrungie or a clean washcloth. ? ?Apply the CHG soap to your body ONLY FROM THE NECK DOWN.  Do not use on open wounds or open sores.  Avoid contact with your eyes, ears, mouth, and genitals (private parts).  Wash face and genitals (private parts) with your normal soap. ? ?Wash thoroughly, paying special attention to the area where your surgery will be performed. ? ?Thoroughly rinse your body with warm water from the neck down. ? ?DO NOT shower/wash with your normal soap after using and rinsing off the CHG soap. ? ? ?Pat yourself dry with a CLEAN TOWEL.  ? ?  Apply benzoyl peroxide.  ? ?Wear CLEAN PAJAMAS to bed the night before surgery; wear comfortable clothes the morning of surgery. ? ?Place CLEAN SHEETS on your bed the night of your first shower and DO NOT SLEEP WITH PETS. ? ?Day of Surgery: ?Shower as above ?Do not apply any deodorants/lotions.  ?Please wear clean clothes to the hospital/surgery center.   ?Remember to brush  your teeth WITH YOUR REGULAR TOOTHPASTE  ? ? ? ?If you received a COVID test during your pre-op visit  it is requested that you wear a mask when out in public, stay away from anyone that may not be feeling well and notify your surgeon if you develop symptoms. If you have been in contact with anyone that has tested positive in the last 10 days please notify you surgeon. ? ?  ?Please read over the following fact sheets that you were given.   ?

## 2021-08-07 ENCOUNTER — Encounter (HOSPITAL_COMMUNITY)
Admission: RE | Admit: 2021-08-07 | Discharge: 2021-08-07 | Disposition: A | Discharge status: Home / Self Care | Payer: Medicare Other | Source: Ambulatory Visit | Attending: Orthopedic Surgery | Admitting: Orthopedic Surgery

## 2021-08-07 ENCOUNTER — Other Ambulatory Visit: Payer: Self-pay

## 2021-08-07 ENCOUNTER — Encounter (HOSPITAL_COMMUNITY): Payer: Self-pay

## 2021-08-07 VITALS — BP 159/80 | HR 100 | Temp 98.3°F | Resp 17 | Ht 63.0 in | Wt 185.8 lb

## 2021-08-07 DIAGNOSIS — F1090 Alcohol use, unspecified, uncomplicated: Secondary | ICD-10-CM | POA: Insufficient documentation

## 2021-08-07 DIAGNOSIS — Z01818 Encounter for other preprocedural examination: Secondary | ICD-10-CM | POA: Insufficient documentation

## 2021-08-07 DIAGNOSIS — Z789 Other specified health status: Secondary | ICD-10-CM

## 2021-08-07 HISTORY — DX: Acute pancreatitis without necrosis or infection, unspecified: K85.90

## 2021-08-07 LAB — COMPREHENSIVE METABOLIC PANEL
ALT: 34 U/L (ref 0–44)
AST: 79 U/L — ABNORMAL HIGH (ref 15–41)
Albumin: 3.6 g/dL (ref 3.5–5.0)
Alkaline Phosphatase: 99 U/L (ref 38–126)
Anion gap: 8 (ref 5–15)
BUN: 8 mg/dL (ref 8–23)
CO2: 27 mmol/L (ref 22–32)
Calcium: 9 mg/dL (ref 8.9–10.3)
Chloride: 103 mmol/L (ref 98–111)
Creatinine, Ser: 0.84 mg/dL (ref 0.44–1.00)
GFR, Estimated: >60 mL/min (ref >60)
Glucose, Bld: 119 mg/dL — ABNORMAL HIGH (ref 70–99)
Potassium: 3.2 mmol/L — ABNORMAL LOW (ref 3.5–5.1)
Sodium: 138 mmol/L (ref 135–145)
Total Bilirubin: 0.9 mg/dL (ref 0.3–1.2)
Total Protein: 7.6 g/dL (ref 6.5–8.1)

## 2021-08-07 LAB — SURGICAL PCR SCREEN
MRSA, PCR: NEGATIVE
Staphylococcus aureus: POSITIVE — AB

## 2021-08-07 LAB — CBC
HCT: 36.2 % (ref 36.0–46.0)
Hemoglobin: 11.5 g/dL — ABNORMAL LOW (ref 12.0–15.0)
MCH: 29.1 pg (ref 26.0–34.0)
MCHC: 31.8 g/dL (ref 30.0–36.0)
MCV: 91.6 fL (ref 80.0–100.0)
Platelets: 236 10*3/uL (ref 150–400)
RBC: 3.95 MIL/uL (ref 3.87–5.11)
RDW: 14.7 % (ref 11.5–15.5)
WBC: 8.4 10*3/uL (ref 4.0–10.5)
nRBC: 0 % (ref 0.0–0.2)

## 2021-08-07 NOTE — Progress Notes (Signed)
Surgical Instructions ? ? ? Your procedure is scheduled on Monday April 17. ? Report to [facility] Main Entrance "A" at 12:30 P.M., then check in with the Admitting office. ? Call this number if you have problems the morning of surgery: ? [phone] ? ? If you have any questions prior to your surgery date call [phone]: Open Monday-Friday 8am-4pm ? ? ? Remember: ? Do not eat after midnight the night before your surgery ? ?You may drink clear liquids until 11:30am the morning of your surgery.   ?Clear liquids allowed are: Water, Non-Citrus Juices (without pulp), Carbonated Beverages, Clear Tea, Black Coffee ONLY (NO MILK, CREAM OR POWDERED CREAMER of any kind), and Gatorade. ? ? ?Enhanced Recovery after Surgery for Orthopedics ?Enhanced Recovery after Surgery is a protocol used to improve the stress on your body and your recovery after surgery. ? ?Patient Instructions ? ?The day of surgery (if you do NOT have diabetes):  ?Drink ONE (1) Pre-Surgery Clear Ensure by 11:30 am the morning of surgery   ?This drink was given to you during your hospital  ?pre-op appointment visit. ?Nothing else to drink after completing the  ?Pre-Surgery Clear Ensure. ? ?       If you have questions, please contact your surgeon?s office. ? ?  ? Take these medicines the morning of surgery with A SIP OF WATER:  ?oxycodone (OXY-IR)  ? ?As of today, STOP taking any Aspirin (unless otherwise instructed by your surgeon) Aleve, Naproxen, Ibuprofen, Motrin, Advil, Goody's, BC's, all herbal medications, fish oil, and all vitamins. ? ?         ?Do not wear jewelry or makeup ?Do not wear lotions, powders, perfumes/colognes, or deodorant. ?Do not shave 48 hours prior to surgery.  Men may shave face and neck. ?Do not bring valuables to the hospital. ?Do not wear nail polish, gel polish, artificial nails, or any other type of covering on natural nails (fingers and toes) ?If you have artificial nails or gel coating that need to be removed by a nail  salon, please have this removed prior to surgery. Artificial nails or gel coating may interfere with anesthesia's ability to adequately monitor your vital signs. ? ?[facility] is not responsible for any belongings or valuables. .  ? ?Do NOT Smoke (Tobacco/Vaping)  24 hours prior to your procedure ? ?If you use a CPAP at night, you may bring your mask for your overnight stay. ?  ?Contacts, glasses, hearing aids, dentures or partials may not be worn into surgery, please bring cases for these belongings ?  ?For patients admitted to the hospital, discharge time will be determined by your treatment team. ?  ?Patients discharged the day of surgery will not be allowed to drive home, and someone needs to stay with them for 24 hours. ? ? ?SURGICAL WAITING ROOM VISITATION ?Patients having surgery or a procedure in a hospital may have two support people. ?Children under the age of 81 must have an adult with them who is not the patient. ?They may stay in the waiting area during the procedure and may switch out with other visitors. If the patient needs to stay at the hospital during part of their recovery, the visitor guidelines for inpatient rooms apply. ? ?Please refer to the [facility] website for the visitor guidelines for Inpatients (after your surgery is over and you are in a regular room).  ? ? ? ? ?  [facility]- Preparing for Total Shoulder Arthroplasty  ? ?Before surgery, you can play an important role. Because skin is not sterile, your skin needs to be as free of germs as possible. You can reduce the number of germs on your skin by using the following products. ?Benzoyl Peroxide Gel ?Reduces the number of germs present on the skin ?Applied twice a day to shoulder area starting two days before surgery   ?Chlorhexidine Gluconate (CHG) Soap ?An antiseptic cleaner that kills germs and bonds with the skin to continue killing germs even after washing ?Used for showering the night before  surgery and morning of surgery ?  ?Oral Hygiene is also important to reduce your risk of infection.                                    ?Remember - BRUSH YOUR TEETH THE MORNING OF SURGERY WITH YOUR REGULAR TOOTHPASTE ? ?================================================================== ? ?Please follow these instructions carefully: ? ?BENZOYL PEROXIDE 5% GEL ? ?Please do not use if you have an allergy to benzoyl peroxide.   If your skin becomes reddened/irritated stop using the benzoyl peroxide. ? ?Starting two days before surgery, apply as follows: ?Apply benzoyl peroxide in the morning and at night. Apply after taking a shower. If you are not taking a shower clean entire shoulder front, back, and side along with the armpit with a clean wet washcloth. ? ?Place a quarter-sized dollop on your shoulder and rub in thoroughly, making sure to cover the front, back, and side of your shoulder, along with the armpit.  ? ?2 days before ____ AM   ____ PM              1 day before ____ AM   ____ PM ? ?                        ? Do this twice a day for two days.  (Last application is the night before surgery, AFTER using the CHG soap as described below). ? ?Do NOT apply benzoyl peroxide gel on the day of surgery. ? ?CHLORHEXIDINE GLUCONATE (CHG) SOAP ? ?Please do not use if you have an allergy to CHG or antibacterial soaps. If your skin becomes reddened/irritated stop using the CHG.  ? ?Do not shave (including legs and underarms) for at least 48 hours prior to first CHG shower. It is OK to shave your face. ? ?Starting the night before surgery, use CHG soap as follows: ? ?Shower the NIGHT BEFORE SURGERY and MORNING OF SURGERY with CHG. ? ?If you choose to wash your hair, wash your hair first as usual with your normal shampoo. ? ?After shampooing, rinse your hair and body thoroughly to remove the shampoo. ? ?Use CHG as you would any other liquid soap.  You can apply CHG directly to the skin and wash gently with a scrungie or a  clean washcloth. ? ?Apply the CHG soap to your body ONLY FROM THE NECK DOWN.  Do not use on open wounds or open sores.  Avoid contact with your eyes, ears, mouth, and genitals (private parts).  Wash face and genitals (private parts) with your normal soap. ? ?Wash thoroughly, paying special attention to the area where your surgery will be performed. ? ?Thoroughly rinse your body with warm water from the neck down. ? ?DO NOT shower/wash with your normal soap after using and rinsing  off the CHG soap. ? ? ?Pat yourself dry with a CLEAN TOWEL.  ? ? Apply benzoyl peroxide.  ? ?Wear CLEAN PAJAMAS to bed the night before surgery; wear comfortable clothes the morning of surgery. ? ?Place CLEAN SHEETS on your bed the night of your first shower and DO NOT SLEEP WITH PETS. ? ?Day of Surgery: ?Shower as above ?Do not apply any deodorants/lotions.  ?Please wear clean clothes to the hospital/surgery center.   ?Remember to brush your teeth WITH YOUR REGULAR TOOTHPASTE  ? ? ? ?If you received a COVID test during your pre-op visit  it is requested that you wear a mask when out in public, stay away from anyone that may not be feeling well and notify your surgeon if you develop symptoms. If you have been in contact with anyone that has tested positive in the last 10 days please notify you surgeon. ? ?  ?Please read over the following fact sheets that you were given.   ?

## 2021-08-07 NOTE — Progress Notes (Signed)
PCP - Dr. Karle Plumber ?Cardiologist - denies ? ?PPM/ICD - n/a ? ?Chest x-ray -n/a  ?EKG - n/a, verified with Karoline Caldwell, PA-C ?Stress Test - denies ?ECHO - denies ?Cardiac Cath - denies ? ?Sleep Study - denies ?CPAP - denies ? ?Blood Thinner Instructions: n/a ?Aspirin Instructions:n/a ? ?ERAS Protcol -Clear liquids until 1130 DOS ?PRE-SURGERY Ensure or G2- Ensure ? ?COVID TEST- n/a ? ?Anesthesia review: No ? ?Patient denies shortness of breath, fever, cough and chest pain at PAT appointment ? ? ?All instructions explained to the patient, with a verbal understanding of the material. Patient agrees to go over the instructions while at home for a better understanding. Patient also instructed to self quarantine after being tested for COVID-19. The opportunity to ask questions was provided. ? ? ?

## 2021-08-10 ENCOUNTER — Ambulatory Visit (HOSPITAL_BASED_OUTPATIENT_CLINIC_OR_DEPARTMENT_OTHER): Payer: Medicare Other | Admitting: Physician Assistant

## 2021-08-10 ENCOUNTER — Other Ambulatory Visit: Payer: Self-pay

## 2021-08-10 ENCOUNTER — Encounter (HOSPITAL_COMMUNITY)
Admission: RE | Disposition: A | Discharge status: Home / Self Care | Payer: Self-pay | Source: Ambulatory Visit | Attending: Orthopedic Surgery

## 2021-08-10 ENCOUNTER — Encounter (HOSPITAL_COMMUNITY): Payer: Self-pay | Admitting: Orthopedic Surgery

## 2021-08-10 ENCOUNTER — Ambulatory Visit (HOSPITAL_COMMUNITY): Payer: Medicare Other | Admitting: Physician Assistant

## 2021-08-10 ENCOUNTER — Ambulatory Visit (HOSPITAL_COMMUNITY): Payer: Medicare Other

## 2021-08-10 ENCOUNTER — Ambulatory Visit (HOSPITAL_COMMUNITY)
Admission: RE | Admit: 2021-08-10 | Discharge: 2021-08-10 | Disposition: A | Discharge status: Home / Self Care | Payer: Medicare Other | Source: Ambulatory Visit | Attending: Orthopedic Surgery | Admitting: Orthopedic Surgery

## 2021-08-10 DIAGNOSIS — S42242A 4-part fracture of surgical neck of left humerus, initial encounter for closed fracture: Secondary | ICD-10-CM | POA: Diagnosis not present

## 2021-08-10 DIAGNOSIS — X58XXXA Exposure to other specified factors, initial encounter: Secondary | ICD-10-CM | POA: Diagnosis not present

## 2021-08-10 DIAGNOSIS — F32A Depression, unspecified: Secondary | ICD-10-CM | POA: Insufficient documentation

## 2021-08-10 DIAGNOSIS — F419 Anxiety disorder, unspecified: Secondary | ICD-10-CM | POA: Insufficient documentation

## 2021-08-10 DIAGNOSIS — S42202A Unspecified fracture of upper end of left humerus, initial encounter for closed fracture: Secondary | ICD-10-CM | POA: Diagnosis present

## 2021-08-10 DIAGNOSIS — Z87891 Personal history of nicotine dependence: Secondary | ICD-10-CM | POA: Diagnosis not present

## 2021-08-10 DIAGNOSIS — K219 Gastro-esophageal reflux disease without esophagitis: Secondary | ICD-10-CM | POA: Insufficient documentation

## 2021-08-10 HISTORY — PX: REVERSE SHOULDER ARTHROPLASTY: SHX5054

## 2021-08-10 SURGERY — ARTHROPLASTY, SHOULDER, TOTAL, REVERSE
Anesthesia: Regional | Site: Shoulder | Laterality: Left

## 2021-08-10 MED ORDER — LACTATED RINGERS IV SOLN: INTRAVENOUS | Status: DC

## 2021-08-10 MED ORDER — ACETAMINOPHEN 10 MG/ML IV SOLN: 1000.0000 mg | Freq: Once | INTRAVENOUS | Status: DC | PRN

## 2021-08-10 MED ORDER — ONDANSETRON HCL 4 MG/2ML IJ SOLN
INTRAMUSCULAR | Status: DC | PRN
  Administered 2021-08-10: 4 mg via INTRAVENOUS

## 2021-08-10 MED ORDER — ORAL CARE MOUTH RINSE: 15.0000 mL | Freq: Once | OROMUCOSAL | Status: AC

## 2021-08-10 MED ORDER — DEXAMETHASONE SODIUM PHOSPHATE 10 MG/ML IJ SOLN
INTRAMUSCULAR | Status: DC | PRN
  Administered 2021-08-10: 10 mg via INTRAVENOUS

## 2021-08-10 MED ORDER — VANCOMYCIN HCL 1000 MG IV SOLR
INTRAVENOUS | Status: AC
  Filled 2021-08-10: qty 20

## 2021-08-10 MED ORDER — PHENYLEPHRINE 40 MCG/ML (10ML) SYRINGE FOR IV PUSH (FOR BLOOD PRESSURE SUPPORT): PREFILLED_SYRINGE | INTRAVENOUS | Status: DC | PRN

## 2021-08-10 MED ORDER — FENTANYL CITRATE (PF) 250 MCG/5ML IJ SOLN
INTRAMUSCULAR | Status: AC
  Filled 2021-08-10: qty 5

## 2021-08-10 MED ORDER — FENTANYL CITRATE (PF) 100 MCG/2ML IJ SOLN: 50.0000 ug | Freq: Once | INTRAMUSCULAR | Status: AC

## 2021-08-10 MED ORDER — BUPIVACAINE HCL (PF) 0.5 % IJ SOLN
INTRAMUSCULAR | Status: DC | PRN
  Administered 2021-08-10: 15 mL via PERINEURAL

## 2021-08-10 MED ORDER — TRANEXAMIC ACID-NACL 1000-0.7 MG/100ML-% IV SOLN
1000.0000 mg | INTRAVENOUS | Status: AC
  Administered 2021-08-10: 1000 mg via INTRAVENOUS

## 2021-08-10 MED ORDER — CEFAZOLIN SODIUM-DEXTROSE 2-4 GM/100ML-% IV SOLN
2.0000 g | INTRAVENOUS | Status: AC
  Administered 2021-08-10: 2 g via INTRAVENOUS

## 2021-08-10 MED ORDER — FENTANYL CITRATE (PF) 100 MCG/2ML IJ SOLN
25.0000 ug | INTRAMUSCULAR | Status: DC | PRN
  Administered 2021-08-10 (×2): 25 ug via INTRAVENOUS
  Administered 2021-08-10: 50 ug via INTRAVENOUS

## 2021-08-10 MED ORDER — 0.9 % SODIUM CHLORIDE (POUR BTL) OPTIME
TOPICAL | Status: DC | PRN
Start: 2021-08-10 — End: 2021-08-10
  Administered 2021-08-10 (×2): 1000 mL

## 2021-08-10 MED ORDER — TRANEXAMIC ACID-NACL 1000-0.7 MG/100ML-% IV SOLN
INTRAVENOUS | Status: AC
  Filled 2021-08-10: qty 100

## 2021-08-10 MED ORDER — CHLORHEXIDINE GLUCONATE 0.12 % MT SOLN
OROMUCOSAL | Status: AC
  Administered 2021-08-10: 15 mL via OROMUCOSAL
  Filled 2021-08-10: qty 15

## 2021-08-10 MED ORDER — BUPIVACAINE LIPOSOME 1.3 % IJ SUSP
INTRAMUSCULAR | Status: DC | PRN
  Administered 2021-08-10: 10 mL via PERINEURAL

## 2021-08-10 MED ORDER — FENTANYL CITRATE (PF) 100 MCG/2ML IJ SOLN
INTRAMUSCULAR | Status: AC
  Filled 2021-08-10: qty 2

## 2021-08-10 MED ORDER — PROPOFOL 10 MG/ML IV BOLUS
INTRAVENOUS | Status: AC
  Filled 2021-08-10: qty 20

## 2021-08-10 MED ORDER — FENTANYL CITRATE (PF) 100 MCG/2ML IJ SOLN
INTRAMUSCULAR | Status: AC
  Administered 2021-08-10: 50 ug via INTRAVENOUS
  Filled 2021-08-10: qty 2

## 2021-08-10 MED ORDER — PHENYLEPHRINE HCL-NACL 20-0.9 MG/250ML-% IV SOLN
INTRAVENOUS | Status: DC | PRN
  Administered 2021-08-10: 50 ug/min via INTRAVENOUS

## 2021-08-10 MED ORDER — METHOCARBAMOL 500 MG PO TABS: 500.0000 mg | ORAL_TABLET | Freq: Four times a day (QID) | ORAL | 1 refills | Status: DC | PRN

## 2021-08-10 MED ORDER — CHLORHEXIDINE GLUCONATE 0.12 % MT SOLN: 15.0000 mL | Freq: Once | OROMUCOSAL | Status: AC

## 2021-08-10 MED ORDER — FENTANYL CITRATE (PF) 250 MCG/5ML IJ SOLN
INTRAMUSCULAR | Status: DC | PRN
  Administered 2021-08-10: 50 ug via INTRAVENOUS
  Administered 2021-08-10: 100 ug via INTRAVENOUS

## 2021-08-10 MED ORDER — MIDAZOLAM HCL 2 MG/2ML IJ SOLN
INTRAMUSCULAR | Status: AC
  Administered 2021-08-10: 2 mg via INTRAVENOUS
  Filled 2021-08-10: qty 2

## 2021-08-10 MED ORDER — PHENYLEPHRINE 40 MCG/ML (10ML) SYRINGE FOR IV PUSH (FOR BLOOD PRESSURE SUPPORT)
PREFILLED_SYRINGE | INTRAVENOUS | Status: DC | PRN
Start: 2021-08-10 — End: 2021-08-10
  Administered 2021-08-10: 160 ug via INTRAVENOUS
  Administered 2021-08-10 (×2): 120 ug via INTRAVENOUS

## 2021-08-10 MED ORDER — CEFAZOLIN SODIUM-DEXTROSE 2-4 GM/100ML-% IV SOLN
INTRAVENOUS | Status: AC
  Filled 2021-08-10: qty 100

## 2021-08-10 MED ORDER — VANCOMYCIN HCL 1 G IV SOLR
INTRAVENOUS | Status: DC | PRN
Start: 2021-08-10 — End: 2021-08-10
  Administered 2021-08-10: 1000 mg

## 2021-08-10 MED ORDER — MIDAZOLAM HCL 2 MG/2ML IJ SOLN: 2.0000 mg | Freq: Once | INTRAMUSCULAR | Status: AC

## 2021-08-10 MED ORDER — ROCURONIUM BROMIDE 10 MG/ML (PF) SYRINGE
PREFILLED_SYRINGE | INTRAVENOUS | Status: DC | PRN
Start: 2021-08-10 — End: 2021-08-10
  Administered 2021-08-10: 40 mg via INTRAVENOUS
  Administered 2021-08-10: 60 mg via INTRAVENOUS

## 2021-08-10 MED ORDER — HYDROMORPHONE HCL 2 MG PO TABS: 2.0000 mg | ORAL_TABLET | ORAL | 0 refills | Status: AC | PRN

## 2021-08-10 MED ORDER — LIDOCAINE 2% (20 MG/ML) 5 ML SYRINGE
INTRAMUSCULAR | Status: DC | PRN
  Administered 2021-08-10: 40 mg via INTRAVENOUS

## 2021-08-10 MED ORDER — SUGAMMADEX SODIUM 200 MG/2ML IV SOLN
INTRAVENOUS | Status: DC | PRN
  Administered 2021-08-10: 200 mg via INTRAVENOUS

## 2021-08-10 MED ORDER — PROPOFOL 10 MG/ML IV BOLUS
INTRAVENOUS | Status: DC | PRN
  Administered 2021-08-10: 140 mg via INTRAVENOUS
  Administered 2021-08-10: 30 mg via INTRAVENOUS

## 2021-08-10 MED ORDER — ONDANSETRON HCL 4 MG PO TABS: 4.0000 mg | ORAL_TABLET | Freq: Every day | ORAL | 1 refills | Status: AC | PRN

## 2021-08-10 SURGICAL SUPPLY — 78 items
BAG COUNTER SPONGE SURGICOUNT (BAG) ×2 IMPLANT
BIT DRILL 5/64X5 DISP (BIT) ×2 IMPLANT
BIT DRILL FLUTED 3.0 STRL (BIT) ×1 IMPLANT
BLADE SAG 18X100X1.27 (BLADE) ×2 IMPLANT
BONE CANC CHIPS 20CC PCAN1/4 (Bone Implant) ×2 IMPLANT
CHIPS CANC BONE 20CC PCAN1/4 (Bone Implant) ×1 IMPLANT
COVER SURGICAL LIGHT HANDLE (MISCELLANEOUS) ×2 IMPLANT
CUP SUT UNIV REVERS 36 NEUTRAL (Cup) ×1 IMPLANT
DRAPE IMP U-DRAPE 54X76 (DRAPES) ×4 IMPLANT
DRAPE INCISE IOBAN 66X45 STRL (DRAPES) ×3 IMPLANT
DRAPE ORTHO SPLIT 77X108 STRL (DRAPES) ×4
DRAPE SURG 17X23 STRL (DRAPES) ×2 IMPLANT
DRAPE SURG ORHT 6 SPLT 77X108 (DRAPES) ×2 IMPLANT
DRAPE U-SHAPE 47X51 STRL (DRAPES) ×2 IMPLANT
DRESSING AQUACEL AG SP 3.5X6 (GAUZE/BANDAGES/DRESSINGS) ×1 IMPLANT
DRSG AQUACEL AG ADV 3.5X10 (GAUZE/BANDAGES/DRESSINGS) ×2 IMPLANT
DRSG AQUACEL AG SP 3.5X6 (GAUZE/BANDAGES/DRESSINGS) ×2
DURAPREP 26ML APPLICATOR (WOUND CARE) ×2 IMPLANT
ELECT BLADE 4.0 EZ CLEAN MEGAD (MISCELLANEOUS) ×2
ELECT REM PT RETURN 9FT ADLT (ELECTROSURGICAL) ×2
ELECTRODE BLDE 4.0 EZ CLN MEGD (MISCELLANEOUS) ×1 IMPLANT
ELECTRODE REM PT RTRN 9FT ADLT (ELECTROSURGICAL) ×1 IMPLANT
FACESHIELD WRAPAROUND (MASK) ×2 IMPLANT
FACESHIELD WRAPAROUND OR TEAM (MASK) ×1 IMPLANT
GLENOID UNI REV MOD 24 +2 LAT (Joint) ×1 IMPLANT
GLENOSPHERE 36 +4 LAT/24 (Joint) ×1 IMPLANT
GLENOSPHERE SYSTEM 36/24 (Shoulder) ×1 IMPLANT
GLOVE BIO SURGEON STRL SZ7.5 (GLOVE) ×4 IMPLANT
GLOVE BIOGEL PI IND STRL 8 (GLOVE) ×2 IMPLANT
GLOVE BIOGEL PI INDICATOR 8 (GLOVE) ×2
GOWN STRL REUS W/ TWL LRG LVL3 (GOWN DISPOSABLE) ×1 IMPLANT
GOWN STRL REUS W/ TWL XL LVL3 (GOWN DISPOSABLE) ×2 IMPLANT
GOWN STRL REUS W/TWL LRG LVL3 (GOWN DISPOSABLE) ×2
GOWN STRL REUS W/TWL XL LVL3 (GOWN DISPOSABLE) ×4
GRAFT BNE CANC CHIPS 1-8 20CC (Bone Implant) IMPLANT
INSERT HUMERAL UNI REVERS 36 3 (Insert) ×1 IMPLANT
KIT BASIN OR (CUSTOM PROCEDURE TRAY) ×2 IMPLANT
KIT TURNOVER KIT B (KITS) ×2 IMPLANT
MANIFOLD NEPTUNE II (INSTRUMENTS) ×2 IMPLANT
NDL 1/2 CIR MAYO (NEEDLE) ×1 IMPLANT
NDL HYPO 25GX1X1/2 BEV (NEEDLE) ×1 IMPLANT
NEEDLE 1/2 CIR MAYO (NEEDLE) ×2 IMPLANT
NEEDLE HYPO 25GX1X1/2 BEV (NEEDLE) ×2 IMPLANT
NS IRRIG 1000ML POUR BTL (IV SOLUTION) ×2 IMPLANT
PACK SHOULDER (CUSTOM PROCEDURE TRAY) ×2 IMPLANT
PAD ARMBOARD 7.5X6 YLW CONV (MISCELLANEOUS) ×4 IMPLANT
PIN SET MODULAR GLENOID SYSTEM (PIN) ×1 IMPLANT
RESTRAINT HEAD UNIVERSAL NS (MISCELLANEOUS) ×2 IMPLANT
SCREW CENTRAL MODULAR 25 (Screw) ×1 IMPLANT
SCREW PERI LOCK 5.5X16 (Screw) ×2 IMPLANT
SCREW PERI LOCK 5.5X24 (Screw) ×1 IMPLANT
SCREW PERI LOCK 5.5X32 (Screw) ×1 IMPLANT
SET PIN UNIVERSAL REVERSE (SET/KITS/TRAYS/PACK) ×1 IMPLANT
SLING ARM IMMOBILIZER LRG (SOFTGOODS) IMPLANT
SLING ARM IMMOBILIZER MED (SOFTGOODS) IMPLANT
SLING ARM IMMOBILIZER XL (CAST SUPPLIES) ×1 IMPLANT
SPONGE T-LAP 18X18 ~~LOC~~+RFID (SPONGE) IMPLANT
SPONGE T-LAP 4X18 ~~LOC~~+RFID (SPONGE) ×2 IMPLANT
STEM HUMERAL UNIVER REV SIZE 7 (Stem) ×1 IMPLANT
STRIP CLOSURE SKIN 1/2X4 (GAUZE/BANDAGES/DRESSINGS) ×2 IMPLANT
SUCTION FRAZIER HANDLE 10FR (MISCELLANEOUS) ×1
SUCTION TUBE FRAZIER 10FR DISP (MISCELLANEOUS) ×1 IMPLANT
SUT FIBERWIRE #2 38 T-5 BLUE (SUTURE) ×4
SUT MNCRL AB 3-0 PS2 18 (SUTURE) ×2 IMPLANT
SUT VIC AB 0 CT1 27 (SUTURE) ×2
SUT VIC AB 0 CT1 27XBRD ANBCTR (SUTURE) IMPLANT
SUT VIC AB 1 CT1 27 (SUTURE) ×2
SUT VIC AB 1 CT1 27XBRD ANBCTR (SUTURE) IMPLANT
SUT VIC AB 2-0 CT1 27 (SUTURE) ×6
SUT VIC AB 2-0 CT1 TAPERPNT 27 (SUTURE) ×1 IMPLANT
SUTURE FIBERWR #2 38 T-5 BLUE (SUTURE) ×1 IMPLANT
SUTURE TAPE 1.3 40 TPR END (SUTURE) IMPLANT
SUTURETAPE 1.3 40 TPR END (SUTURE) ×8
SYR CONTROL 10ML LL (SYRINGE) ×2 IMPLANT
TOWEL GREEN STERILE (TOWEL DISPOSABLE) ×2 IMPLANT
TOWEL GREEN STERILE FF (TOWEL DISPOSABLE) ×2 IMPLANT
WATER STERILE IRR 1000ML POUR (IV SOLUTION) ×2 IMPLANT
YANKAUER SUCT BULB TIP NO VENT (SUCTIONS) ×2 IMPLANT

## 2021-08-10 NOTE — Anesthesia Procedure Notes (Signed)
Procedure Name: Intubation ?Date/Time: 08/10/2021 1:57 PM ?Performed by: Dorthea Cove, CRNA ?Pre-anesthesia Checklist: Patient identified, Emergency Drugs available, Suction available and Patient being monitored ?Patient Re-evaluated:Patient Re-evaluated prior to induction ?Oxygen Delivery Method: Circle system utilized ?Preoxygenation: Pre-oxygenation with 100% oxygen ?Induction Type: IV induction ?Ventilation: Mask ventilation without difficulty ?Laryngoscope Size: Mac and 3 ?Grade View: Grade II ?Tube type: Oral ?Tube size: 7.0 mm ?Number of attempts: 1 ?Airway Equipment and Method: Stylet and Oral airway ?Placement Confirmation: ETT inserted through vocal cords under direct vision, positive ETCO2 and breath sounds checked- equal and bilateral ?Secured at: 21 cm ?Tube secured with: Tape ?Dental Injury: Teeth and Oropharynx as per pre-operative assessment  ? ? ? ? ?

## 2021-08-10 NOTE — H&P (Signed)
? ?ORTHOPAEDIC H&P ? ?REQUESTING PHYSICIAN: Nicholes Stairs, MD ? ?PCP:  Ladell Pier, MD ? ?Chief Complaint: Left proximal humerus fracture ? ?HPI: ?Debra Jackson is a 66 y.o. female who complains of left shoulder pain and dysfunction following a fall about 6 weeks ago.  This resulted in a comminuted proximal humerus fracture with head involvement and about 100% of posterior displacement of the humeral head against the shaft.  Here today for reverse arthroplasty for definitive treatment. ? ?Past Medical History:  ?Diagnosis Date  ? Anxiety   ? Depression   ? GERD (gastroesophageal reflux disease)   ? Opiate abuse, continuous ([place])   ? Pancreatitis, acute   ? Polysubstance abuse ([place])   ? ?Past Surgical History:  ?Procedure Laterality Date  ? BIOPSY  03/14/2021  ? Procedure: BIOPSY;  Surgeon: Milus Banister, MD;  Location: [facility] ENDOSCOPY;  Service: Endoscopy;;  ? ESOPHAGOGASTRODUODENOSCOPY (EGD) WITH PROPOFOL N/A 03/14/2021  ? Procedure: ESOPHAGOGASTRODUODENOSCOPY (EGD) WITH PROPOFOL;  Surgeon: Milus Banister, MD;  Location: [facility] ENDOSCOPY;  Service: Endoscopy;  Laterality: N/A;  ? TONSILLECTOMY    ? ?Social History  ? ?Socioeconomic History  ? Marital status: Single  ?  Spouse name: Not on file  ? Number of children: Not on file  ? Years of education: Not on file  ? Highest education level: Not on file  ?Occupational History  ? Not on file  ?Tobacco Use  ? Smoking status: Former  ?  Types: Cigarettes  ? Smokeless tobacco: Never  ?Vaping Use  ? Vaping Use: Never used  ?Substance and Sexual Activity  ? Alcohol use: Not Currently  ?  Comment: rarely-slowed down from 6 months ago.  ? Drug use: Not Currently  ?  Types: Cocaine  ? Sexual activity: Not on file  ?Other Topics Concern  ? Not on file  ?Social History Narrative  ? Not on file  ? ?Social Determinants of Health  ? ?Financial Resource Strain: Not on file  ?Food Insecurity: Not on file  ?Transportation Needs: Not on file  ?Physical Activity: Not  on file  ?Stress: Not on file  ?Social Connections: Not on file  ? ?Family History  ?Adopted: Yes  ? ?Allergies  ?Allergen Reactions  ? Penicillins Nausea And Vomiting  ? ?Prior to Admission medications   ?Medication Sig Start Date End Date Taking? Authorizing Provider  ?citalopram (CELEXA) 20 MG tablet Take 1 tablet (20 mg total) by mouth at bedtime. 06/17/21  Yes Ladell Pier, MD  ?ibuprofen (ADVIL) 200 MG tablet Take 400 mg by mouth every 6 (six) hours as needed for moderate pain or mild pain.   Yes [provider]  ?Multiple Vitamins-Minerals (MULTIVITAMIN WITH MINERALS) tablet Take 1 tablet by mouth daily.   Yes [provider]  ?oxycodone (OXY-IR) 5 MG capsule Take 5 mg by mouth every 4 (four) hours.   Yes [provider]  ?oxyCODONE-acetaminophen (PERCOCET/ROXICET) 5-325 MG tablet Take 1 tablet by mouth every 6 (six) hours as needed for severe pain. ?Patient not taking: Reported on 08/04/2021 06/28/21   Adolphus Birchwood, PA-C  ? ?No results found. ? ?Positive ROS: All other systems have been reviewed and were otherwise negative with the exception of those mentioned in the HPI and as above. ? ?Physical Exam: ?General: Alert, no acute distress ?Cardiovascular: No pedal edema ?Respiratory: No cyanosis, no use of accessory musculature ?GI: No organomegaly, abdomen is soft and non-tender ?Skin: No lesions in the area of chief complaint ?Neurologic:  Sensation intact distally ?Psychiatric: Patient is competent for consent with normal mood and affect ?Lymphatic: No axillary or cervical lymphadenopathy ? ?MUSCULOSKELETAL: Left upper extremity is warm and well-perfused.  No open wounds or lesions. ? ?Assessment: ?Three-part closed left proximal humerus fracture ? ?Plan: ?-Plan to proceed today with reverse arthroplasty for definitive treatment of this significantly displaced and rotated proximal humerus fracture.  We again discussed the risk and benefits of the procedure.  We discussed the  risk of bleeding, infection, damage to surrounding nerves and vessels, stiffness, dislocation, fracture, risk of anesthesia and the risk of revision surgery.  She has provided informed consent. ? ?-Plan for overnight observation postoperatively. ? ? ? ?Nicholes Stairs, MD ?Cell [phone]  ? ? ?08/10/2021 ?1:24 PM ? ?

## 2021-08-10 NOTE — Discharge Instructions (Addendum)
Orthopedic surgery discharge instructions: ? ?-Maintain postoperative bandage until follow-up appointment.  This is waterproof, and you may begin showering on postoperative day #3.  Do not submerge underwater.  Maintain that bandage until your follow-up appointment in 2 weeks. ? ?-No lifting over 2 pounds with operateive arm.   maintain your sling when you are out of the house and sleeping.  Only remove to bath and get dressed. ? ?-Apply ice liberally to the shoulder throughout the day.  For mild to moderate pain use Tylenol and Advil as needed around-the-clock.  For breakthrough pain use oxycodone as necessary. ? ?-You will return to see Dr. Stann Mainland in the office in 2 weeks for routine postoperative check with x-rays.  ?

## 2021-08-10 NOTE — Brief Op Note (Signed)
08/10/2021 ? ?4:22 PM ? ?PATIENT:  Debra Jackson  66 y.o. female ? ?PRE-OPERATIVE DIAGNOSIS:  Left proximal humerus fracture ? ?POST-OPERATIVE DIAGNOSIS:  Left proximal humerus fracture ? ?PROCEDURE:  Procedure(s): ?REVERSE SHOULDER ARTHROPLASTY (Left) ? ?SURGEON:  Surgeon(s) and Role: ?   Stann Mainland, Elly Modena, MD - Primary ? ?PHYSICIAN ASSISTANT: Jonelle Sidle, PA-C ? ?ANESTHESIA:   regional and general ? ?EBL:  150 mL  ? ?BLOOD ADMINISTERED:none ? ?DRAINS: none  ? ?LOCAL MEDICATIONS USED:  NONE ? ?SPECIMEN:  No Specimen ? ?DISPOSITION OF SPECIMEN:  N/A ? ?COUNTS:  YES ? ?TOURNIQUET:  * No tourniquets in log * ? ?DICTATION: .Note written in EPIC ? ?PLAN OF CARE: Admit for overnight observation ? ?PATIENT DISPOSITION:  PACU - hemodynamically stable. ?  ?Delay start of Pharmacological VTE agent (>24hrs) due to surgical blood loss or risk of bleeding: not applicable ? ?

## 2021-08-10 NOTE — Anesthesia Preprocedure Evaluation (Signed)
Anesthesia Evaluation  ?Patient identified by MRN, date of birth, ID band ?Patient awake ? ? ? ?Reviewed: ?Allergy & Precautions, NPO status , Patient's Chart, lab work & pertinent test results ? ?Airway ?Mallampati: II ? ?TM Distance: >3 FB ?Neck ROM: Full ? ? ? Dental ?no notable dental hx. ? ?  ?Pulmonary ?neg pulmonary ROS, former smoker,  ?  ?Pulmonary exam normal ? ? ? ? ? ? ? Cardiovascular ?negative cardio ROS ? ? ?Rhythm:Regular Rate:Normal ? ? ?  ?Neuro/Psych ?Anxiety Depression negative neurological ROS ?   ? GI/Hepatic ?Neg liver ROS, GERD  ,  ?Endo/Other  ?negative endocrine ROS ? Renal/GU ?negative Renal ROS  ?negative genitourinary ?  ?Musculoskeletal ?Left humerus fx  ? Abdominal ?Normal abdominal exam  (+)   ?Peds ? Hematology ?negative hematology ROS ?(+)   ?Anesthesia Other Findings ? ? Reproductive/Obstetrics ? ?  ? ? ? ? ? ? ? ? ? ? ? ? ? ?  ?  ? ? ? ? ? ? ? ? ?Anesthesia Physical ?Anesthesia Plan ? ?ASA: 2 ? ?Anesthesia Plan: General and Regional  ? ?Post-op Pain Management: Regional block*  ? ?Induction: Intravenous ? ?PONV Risk Score and Plan: 3 and Ondansetron, Dexamethasone, Midazolam and Treatment may vary due to age or medical condition ? ?Airway Management Planned: Mask and Oral ETT ? ?Additional Equipment: None ? ?Intra-op Plan:  ? ?Post-operative Plan: Extubation in OR ? ?Informed Consent: I have reviewed the patients History and Physical, chart, labs and discussed the procedure including the risks, benefits and alternatives for the proposed anesthesia with the patient or authorized representative who has indicated his/her understanding and acceptance.  ? ? ? ?Dental advisory given ? ?Plan Discussed with: CRNA ? ?Anesthesia Plan Comments: (Lab Results ?     Component                Value               Date                 ?     WBC                      8.4                 08/07/2021           ?     HGB                      11.5 (L)             08/07/2021           ?     HCT                      36.2                08/07/2021           ?     MCV                      91.6                08/07/2021           ?     PLT                      236  08/07/2021           ?Lab Results ?     Component                Value               Date                 ?     NA                       138                 08/07/2021           ?     K                        3.2 (L)             08/07/2021           ?     CO2                      27                  08/07/2021           ?     GLUCOSE                  119 (H)             08/07/2021           ?     BUN                      8                   08/07/2021           ?     CREATININE               0.84                08/07/2021           ?     CALCIUM                  9.0                 08/07/2021           ?     EGFR                     95                  05/12/2021           ?     GFRNONAA                 >60                 08/07/2021          )  ? ? ? ? ? ? ?Anesthesia Quick Evaluation ? ?

## 2021-08-10 NOTE — Transfer of Care (Signed)
Immediate Anesthesia Transfer of Care Note ? ?Patient: Debra Jackson ? ?Procedure(s) Performed: REVERSE SHOULDER ARTHROPLASTY (Left: Shoulder) ? ?Patient Location: PACU ? ?Anesthesia Type:General and Regional ? ?Level of Consciousness: awake, alert  and oriented ? ?Airway & Oxygen Therapy: Patient Spontanous Breathing and Patient connected to nasal cannula oxygen ? ?Post-op Assessment: Report given to RN and Post -op Vital signs reviewed and stable ? ?Post vital signs: Reviewed and stable ? ?Last Vitals:  ?Vitals Value Taken Time  ?BP 100/57 08/10/21 1657  ?Temp    ?Pulse 91 08/10/21 1659  ?Resp 23 08/10/21 1659  ?SpO2 89 % 08/10/21 1659  ?Vitals shown include unvalidated device data. ? ?Last Pain:  ?Vitals:  ? 08/10/21 1155  ?TempSrc:   ?PainSc: 0-No pain  ?   ? ?  ? ?Complications: No notable events documented. ?

## 2021-08-10 NOTE — Op Note (Signed)
Date: 08/10/2021 ?  ?PRE-OPERATIVE DIAGNOSIS:  Left 4 part proximal humerus fracture ?  ?POST-OPERATIVE DIAGNOSIS:  Same ?  ?PROCEDURE:  1. Left REVERSE SHOULDER ARTHROPLASTY ?  ?SURGEON:  Nicholes Stairs, MD ?  ?ASSISTANT: Jonelle Sidle, PA-C ? ?Assistant attestation: ? ?PA Mcclung present for the entire procedure. ?  ?ANESTHESIA:   General with a block ?  ?ESTIMATED BLOOD LOSS: See anesthesia record ?  ?PREOPERATIVE INDICATIONS: Debra Jackson is a 66 yo, RHD female who sustained a left four part proximal humerus fracture following a fall, approximately 2 months ago.  Due to the comminution and displaced nature of the fracture and head splitting components we discussed operative management.  We discussed moving forward with reverse shoulder arthroplasty for his injury. ?Thus we elected to proceed with reverse shoulder arthroplasty for the plan.  The risks benefits and alternatives were discussed with the patient preoperatively including but not limited to the risks of infection, bleeding, nerve injury, cardiopulmonary complications, the need for revision surgery, dislocation, brachial plexus palsy, incomplete relief of pain, among others, and the patient was willing to proceed. The patient did provided informed consent. ?  ?OPERATIVE IMPLANTS:  ?Arthrex reverse system with a size 7 universal stem ?Size 24+2 lateralized glenosphere baseplate with 25 mm central screw as well as 4 peripheral locking screws ?We initially implanted a 36+4 lateralized glenosphere, but this was noted to be too tight for the nature of her injury and due to the concern for overtightening her we then swapped that out for a 36+3 lateralized glenosphere. ?On the humeral side a standard tray with a +3 constrained liner ?  ?OPERATIVE FINDINGS:   ?This was a pretty mature malunion of the proximal humerus with head splitting component that had kicked into a posteriorly rolled off position.  The greater tuberosity fragment was completely  healed to the posterior aspect of the humeral head and to the metaphysis of the humeral shaft which was high riding.  Lesser tuberosity fragment was malunited to the medial calcar.  Rotator cuff tendons were intact x4.  There was no glenoid wear.  The biceps tendon long head had spontaneously torn in the intertubercular groove due to the attritional injury at the fracture line there.  Also her bone quality was quite poor.  Proximally the calcar was intact, but the posterior lateral aspect of the metaphysis at the greater tuberosity was missing approximately a centimeter and a half of a chevron shaped bone.  This created some issues with getting good proximal bite with her stem. ?  ?OPERATIVE PROCEDURE: The patient was brought to the operating room and placed in the supine position. General anesthesia was administered. IV antibiotics were given. Time out was performed. The upper extremity was prepped and draped in usual sterile fashion. The patient was in a beachchair position. Deltopectoral approach was carried out. After dissection through skin and subcutaneous fat, the cephalic vein was identified with the deltopectoral interval.  This was mobilized and taken lateral. ?  ?The fracture was identified and working through the fracture in the biceps groove the lesser and greater tuberosities were freed up and tagged with a total of 4 Arthrex suture tape sutures.  The humeral head was fragmented and removed from the wound.  We then took some time to meticulously liberate the tuberosity fragment from the posterior aspect of the humeral shaft and humeral head. ?  ?Next, the long head of the biceps tendon was released from its incarcerated position in the intertubercular groove ?  ?I  then performed circumferential releases of the humerus.  I then moved to sizing the humerus.  There was minimal calcar bone loss and this was used to reference the height of the stem, as was the upper border of the pectoralis major tendon.   The canal was reamed and found to fit best with a 7 mm fracture stem. ?  ?We next turned to the glenoid.  Deep retractors were placed, and I resected the labrum as well as the residual long head of biceps, and then placed a guidepin into the center position on the glenoid, with slight inferior declination. I then reamed over the guidepin, and this created a small metaphyseal cancellus blush inferiorly, removing just the cartilage to the subchondral bone superiorly. The base plate was selected and placed with a 25 mm central compression screw.  This had reasonable bite but not significantly good bite.  We then placed 4 peripheral locking screws with good bony length noted superior and inferior.  I then turned my attention to the glenosphere, and impacted this into place, placing slight inferior offset.  ?  ?The glenoid sphere was completely seated.  And we initially placed a 36+4 mm lateralized glenosphere as is my typical implant for fracture.  However this was noted to be too tight for smooth reduction of the humerus.  Therefore we went back in with a 36+0.  Unfortunately, we did have to waste of the original glenosphere. ?   ?The 7 mm fracture stem was seated to the appropriate height to allow approximate 5.6 cm from the top of the Pectoralis major tendon to the top of the glenosphere.  The stem was placed in 30 degrees of retroversion.   Once the stem was impacted into place we trialed poly liners.  Using the standard humeral metaglen,  The shoulder had excellent motion, and was stable.  The final poly was impacted and again showed good motion and stability.  Next, I irrigated the wounds copiously.  ?  ?The greater tuberosity was brought back to the humeral stem suture holes and secured with bone graft from the humeral head as augment.  The lesser tuberosity was  able to be brought back to the stem and the deltoid was noted to have excellent tension.  The axillary nerve was palpated at the end of implanting, and  found to be in continuity and not under undo tension. ?  ?I then irrigated the shoulder copiously once more, placed a gram of vancomycin powder into the interval, repaired the deltopectoral interval with Vicryl followed by subcutaneous monocryl and then subcuticular monocryl with Steri-Strips and sterile gauze for the skin. The patient was awakened and returned back in stable and satisfactory condition. There no complications and he tolerated the procedure well.  All counts were correct. ? The patient awakened from general anesthesia with no complications and transferred to PACU in stable condition. ?  ?Postoperative Plan: ALMARIE KURDZIEL will remain in her sling at all times until 4 weeks from surgery and then remove it for table slides and activities of daily living but will continue to be nonweightbearing for 6 weeks.  She will take bid ASA for DVT ppx for 4 week.  She will discharge home today from PACU.  We will see her back in the office in 2 weeks with 2 x-ray views of the left shoulder. ?

## 2021-08-10 NOTE — Anesthesia Procedure Notes (Signed)
Anesthesia Regional Block: Interscalene brachial plexus block  ? ?Pre-Anesthetic Checklist: , timeout performed,  Correct Patient, Correct Site, Correct Laterality,  Correct Procedure, Correct Position, site marked,  Risks and benefits discussed,  Surgical consent,  Pre-op evaluation,  At surgeon's request and post-op pain management ? ?Laterality: Left ? ?Prep: Dura Prep     ?  ?Needles:  ?Injection technique: Single-shot ? ?Needle Type: Echogenic Stimulator Needle   ? ? ?Needle Length: 5cm  ?Needle Gauge: 20  ? ? ? ?Additional Needles: ? ? ?Procedures:,,,, ultrasound used (permanent image in chart),,    ?Narrative:  ?Start time: 08/10/2021 1:00 PM ?End time: 08/10/2021 1:04 PM ?Injection made incrementally with aspirations every 5 mL. ? ?Performed by: Personally  ?Anesthesiologist: Darral Dash, DO ? ?Additional Notes: ?Patient identified. Risks/Benefits/Options discussed with patient including but not limited to bleeding, infection, nerve damage, failed block, incomplete pain control. Patient expressed understanding and wished to proceed. All questions were answered. Sterile technique was used throughout the entire procedure. Please see nursing notes for vital signs. Aspirated in 5cc intervals with injection for negative confirmation. Patient was given instructions on fall risk and not to get out of bed. All questions and concerns addressed with instructions to call with any issues or inadequate analgesia.   ?  ? ? ? ? ?

## 2021-08-11 ENCOUNTER — Encounter (HOSPITAL_COMMUNITY): Payer: Self-pay | Admitting: Orthopedic Surgery

## 2021-08-11 NOTE — Anesthesia Postprocedure Evaluation (Signed)
Anesthesia Post Note ? ?Patient: Debra Jackson ? ?Procedure(s) Performed: REVERSE SHOULDER ARTHROPLASTY (Left: Shoulder) ? ?  ? ?Patient location during evaluation: PACU ?Anesthesia Type: Regional and General ?Level of consciousness: awake and alert ?Pain management: pain level controlled ?Vital Signs Assessment: post-procedure vital signs reviewed and stable ?Respiratory status: spontaneous breathing, nonlabored ventilation, respiratory function stable and patient connected to nasal cannula oxygen ?Cardiovascular status: blood pressure returned to baseline and stable ?Postop Assessment: no apparent nausea or vomiting ?Anesthetic complications: no ? ? ?No notable events documented. ? ?Last Vitals:  ?Vitals:  ? 08/10/21 1807 08/10/21 1822  ?BP: 126/72 129/82  ?Pulse: 95 99  ?Resp: 19 17  ?Temp:  36.8 ?C  ?SpO2: 93% 98%  ?  ?Last Pain:  ?Vitals:  ? 08/10/21 1745  ?TempSrc:   ?PainSc: 7   ? ? ?  ?  ?  ?  ?  ?  ? ?March Rummage Aleksis Jiggetts ? ? ? ? ?

## 2021-08-18 ENCOUNTER — Other Ambulatory Visit (HOSPITAL_COMMUNITY): Payer: Self-pay | Admitting: Orthopedic Surgery

## 2021-08-18 DIAGNOSIS — M79602 Pain in left arm: Secondary | ICD-10-CM

## 2021-08-19 ENCOUNTER — Ambulatory Visit (HOSPITAL_COMMUNITY)
Admission: RE | Admit: 2021-08-19 | Discharge: 2021-08-19 | Disposition: A | Discharge status: Home / Self Care | Payer: Medicare Other | Source: Ambulatory Visit | Attending: Cardiovascular Disease | Admitting: Cardiovascular Disease

## 2021-08-19 ENCOUNTER — Telehealth: Payer: Self-pay | Admitting: Internal Medicine

## 2021-08-19 ENCOUNTER — Ambulatory Visit: Payer: Self-pay | Admitting: *Deleted

## 2021-08-19 ENCOUNTER — Telehealth: Payer: Self-pay | Admitting: *Deleted

## 2021-08-19 DIAGNOSIS — M79602 Pain in left arm: Secondary | ICD-10-CM | POA: Insufficient documentation

## 2021-08-19 NOTE — Telephone Encounter (Signed)
Pts husband called in to return the call, the number listed was his, but he was trying to get back in touch with Dr. Wynetta Emery, so they can get this taken care. Or to reach out to the patient, they are requesting a call back, please advise.  ?

## 2021-08-19 NOTE — Telephone Encounter (Signed)
See NT encounter

## 2021-08-19 NOTE — Telephone Encounter (Signed)
I received a message today from a vascular technician at [facility] vascular lab informing me that patient had Doppler ultrasound of the left upper extremity today that was ordered by [name].  It was negative for DVT but positive for superficial vein thrombosis in the cephalic vein.  The specialist recommended that they reach out to me to have this addressed.  I attempted to call the patient.  Home phone number listed is out of service.  The other phone number listed under her spouse [name] did not answer.  I left a voicemail informing of who I am and that I will try to call back later.  However I suggest that Ms. Debra Jackson calls the office and schedule an urgent care appointment. ?

## 2021-08-19 NOTE — Telephone Encounter (Signed)
Routing to PCP

## 2021-08-19 NOTE — Telephone Encounter (Signed)
Patient's husband returned call. Husband reports he could not understand message left on voicemail by Dr. Wynetta Emery.  Patient is at home and husband is not with patient but 2 hours away in [place]. Husband reports he would like a text if possible with instructions for wife's care at this time [phone]. Please advise . Recommended to take patient to ED if symptoms of severe pain, chest pain, difficulty breathing, fever noted to call 911/ go to ED. Husband requesting to be notified.  ?

## 2021-08-19 NOTE — Telephone Encounter (Signed)
PC returned to # listed in message for pt's husband. ?I got VMM that he is on the phone and can not take call at the moment.   ?I left message letting him know that I need to speak with the pt and advise that she call office in the a.m and request UC appt. ?

## 2021-08-19 NOTE — Telephone Encounter (Signed)
Called patient no answer and left message on voicemail appt is scheduled for 08/20/21 tomorrow at 10:10 am.  ?

## 2021-08-20 ENCOUNTER — Encounter: Payer: Self-pay | Admitting: Internal Medicine

## 2021-08-20 ENCOUNTER — Ambulatory Visit: Payer: Medicare Other | Attending: Internal Medicine | Admitting: Internal Medicine

## 2021-08-20 VITALS — BP 132/79 | HR 81 | Temp 98.7°F | Resp 20 | Ht 63.0 in | Wt 187.0 lb

## 2021-08-20 DIAGNOSIS — Z8781 Personal history of (healed) traumatic fracture: Secondary | ICD-10-CM | POA: Diagnosis not present

## 2021-08-20 DIAGNOSIS — S2000XA Contusion of breast, unspecified breast, initial encounter: Secondary | ICD-10-CM

## 2021-08-20 DIAGNOSIS — Z780 Asymptomatic menopausal state: Secondary | ICD-10-CM

## 2021-08-20 DIAGNOSIS — I82612 Acute embolism and thrombosis of superficial veins of left upper extremity: Secondary | ICD-10-CM

## 2021-08-20 NOTE — Telephone Encounter (Signed)
Fyi.

## 2021-08-20 NOTE — Telephone Encounter (Signed)
Noted confirmed appt  ?

## 2021-08-20 NOTE — Progress Notes (Signed)
? ? ?Patient ID: Debra Jackson, female    DOB: [birth year]  MRN: [id number] ? ?CC:  Blood clot LT arm ? ?Subjective: ?Debra Jackson is a 66 y.o. female who presents for UC visit for blood clot in LT arm.  Husband is with her. ?Her concerns today include:  ?Patient with history of ETOH pancreatitis, MDD, ADHD, colonic mass, multiple tubular adenomatous polyps, hx of drug overdose with clonazepam 01/2021, polysubstance abuse ? ?Since last visit with me 04/2021, patient sustained fracture to left proximal humerus 06/28/2021.  She was walking her dog and had bent over to pick up his feces when the dog pulled on the leash causing her to fall and land hard on the concrete pavement on her left side. ?Underwent reverse arthroplasty by orthopedics Dr. Victorino December on 08/10/2021. ?Postsurgery, she developed swelling and bruising in the left arm, left breast and medial aspect of the right breast.  She was seen by Ortho PA 08/14/2021 and reported the symptoms.  She states she was told that it is not unusual to develop swelling, and bruising post joint replacement surgery.  She was told to use ice and elevate the arm.  Was kept in a sling.  Patient states she elevates it at night and has been icing the arm and the breast.  The swelling and bruising progressed and she developed some swelling in the left hand.  Her husband shows me pictures of the hand on his cell phone.  They contacted orthopedics again earlier this week and Doppler ultrasound of the upper extremity was ordered.  This revealed no DVT but did show age-indeterminate superficial vein thrombosis in the cephalic vein.  Vascular tech was unable to say the distance that the thrombus is from the deep vein because of compression dressing that patient has on the upper arm over the sutures/staples.  The orthopedic specialist Dr. Stann Mainland requested that I addressed the thrombosis.  She is not on aspirin or any blood thinners. ? ?Today patient reports that the swelling in the left  hand has somewhat decreased.  Swelling in the arm has also decreased some with elevation.  She has also started moving the fingers and arms a little bit and this has helped.  She continues to elevate the arm when sitting and lying down.  The bruising over the breast has significantly decreased.  She tells me she felt a little short of breath and flustered when she first got here today but is okay now. ?She tells me she has remained free of alcohol. ? ?Patient Active Problem List  ? Diagnosis Date Noted  ? Diarrhea   ? Hypokalemia   ? Colonic mass   ? Hematemesis with nausea   ? Acute pancreatitis 03/13/2021  ? Pancreatitis 03/13/2021  ? MDD (major depressive disorder), recurrent episode, severe ([place]) 02/10/2021  ? ADHD 02/10/2021  ? Major depressive disorder, recurrent ([place]) 02/09/2021  ? Suicide attempt [facility])   ?  ? ?Current Outpatient Medications on File Prior to Visit  ?Medication Sig Dispense Refill  ? citalopram (CELEXA) 20 MG tablet Take 1 tablet (20 mg total) by mouth at bedtime. 30 tablet 6  ? methocarbamol (ROBAXIN) 500 MG tablet Take 1 tablet (500 mg total) by mouth every 6 (six) hours as needed for muscle spasms. 45 tablet 1  ? Multiple Vitamins-Minerals (MULTIVITAMIN WITH MINERALS) tablet Take 1 tablet by mouth daily.    ? ondansetron (ZOFRAN) 4 MG tablet Take 1 tablet (4 mg total) by mouth daily as needed for nausea  or vomiting. 20 tablet 1  ? oxycodone (OXY-IR) 5 MG capsule Take 5 mg by mouth every 4 (four) hours as needed.    ? ?No current facility-administered medications on file prior to visit.  ? ? ?Allergies  ?Allergen Reactions  ? Penicillins Nausea And Vomiting  ? ? ?Social History  ? ?Socioeconomic History  ? Marital status: Single  ?  Spouse name: Not on file  ? Number of children: Not on file  ? Years of education: Not on file  ? Highest education level: Not on file  ?Occupational History  ? Not on file  ?Tobacco Use  ? Smoking status: Former  ?  Types: Cigarettes  ? Smokeless tobacco: Never   ?Vaping Use  ? Vaping Use: Never used  ?Substance and Sexual Activity  ? Alcohol use: Not Currently  ?  Comment: rarely-slowed down from 6 months ago.  ? Drug use: Not Currently  ?  Types: Cocaine  ? Sexual activity: Not on file  ?Other Topics Concern  ? Not on file  ?Social History Narrative  ? Not on file  ? ?Social Determinants of Health  ? ?Financial Resource Strain: Not on file  ?Food Insecurity: Not on file  ?Transportation Needs: Not on file  ?Physical Activity: Not on file  ?Stress: Not on file  ?Social Connections: Not on file  ?Intimate Partner Violence: Not on file  ? ? ?Family History  ?Adopted: Yes  ? ? ?Past Surgical History:  ?Procedure Laterality Date  ? BIOPSY  03/14/2021  ? Procedure: BIOPSY;  Surgeon: Milus Banister, MD;  Location: [facility] ENDOSCOPY;  Service: Endoscopy;;  ? ESOPHAGOGASTRODUODENOSCOPY (EGD) WITH PROPOFOL N/A 03/14/2021  ? Procedure: ESOPHAGOGASTRODUODENOSCOPY (EGD) WITH PROPOFOL;  Surgeon: Milus Banister, MD;  Location: [facility] ENDOSCOPY;  Service: Endoscopy;  Laterality: N/A;  ? REVERSE SHOULDER ARTHROPLASTY Left 08/10/2021  ? Procedure: REVERSE SHOULDER ARTHROPLASTY;  Surgeon: Nicholes Stairs, MD;  Location: [place];  Service: Orthopedics;  Laterality: Left;  ? TONSILLECTOMY    ? ? ?ROS: ?Review of Systems ?Negative except as stated above ? ?PHYSICAL EXAM: ?BP 132/79   Pulse 81   Temp 98.7 ?F (37.1 ?C) (Oral)   Resp 20   Ht '5\' 3"'$  (1.6 m)   Wt 187 lb (84.8 kg)   SpO2 96%   BMI 33.13 kg/m?   ?Physical Exam ? ?General appearance - alert, well appearing, older Caucasian female and in no distress ?Mental status -patient appears a bit nervous. ?Chest - clear to auscultation, no wheezes, rales or rhonchi, symmetric air entry ?Heart - normal rate, regular rhythm, normal S1, S2, no murmurs, rubs, clicks or gallops ?Breasts -she has resolving ecchymosis of the entire left breast with some residual seen laterally.  She has resolving mild ecchymosis on the lower medial aspect of the  right breast.  Breasts are nontender to touch. ?Musculoskeletal -left arm: She has a dressing over the left upper arm to about the midshaft of the humerus.  She has mild about 1+ edema of the upper and lower arm but patient states this has improved.  She has trace edema of the left hand. ? ? ? ?  Latest Ref Rng & Units 08/07/2021  ?  2:09 PM 05/12/2021  ?  3:51 PM 03/18/2021  ?  3:17 PM  ?CMP  ?Glucose 70 - 99 mg/dL 119   89     ?BUN 8 - 23 mg/dL 8   13     ?Creatinine 0.44 - 1.00 mg/dL 0.84  0.70     ?Sodium 135 - 145 mmol/L 138   138     ?Potassium 3.5 - 5.1 mmol/L 3.2   3.7   3.9    ?Chloride 98 - 111 mmol/L 103   99     ?CO2 22 - 32 mmol/L 27   24     ?Calcium 8.9 - 10.3 mg/dL 9.0   9.8     ?Total Protein 6.5 - 8.1 g/dL 7.6   7.3     ?Total Bilirubin 0.3 - 1.2 mg/dL 0.9   0.3     ?Alkaline Phos 38 - 126 U/L 99   148     ?AST 15 - 41 U/L 79   44     ?ALT 0 - 44 U/L 34   18     ? ?Lipid Panel  ?   ?Component Value Date/Time  ? CHOL 241 (H) 02/11/2021 0711  ? TRIG 83 02/11/2021 0711  ? HDL 52 02/11/2021 0711  ? CHOLHDL 4.6 02/11/2021 0711  ? VLDL 17 02/11/2021 0711  ? LDLCALC 172 (H) 02/11/2021 0711  ? ? ?CBC ?   ?Component Value Date/Time  ? WBC 8.4 08/07/2021 1409  ? RBC 3.95 08/07/2021 1409  ? HGB 11.5 (L) 08/07/2021 1409  ? HGB 14.1 05/12/2021 1551  ? HCT 36.2 08/07/2021 1409  ? HCT 41.3 05/12/2021 1551  ? PLT 236 08/07/2021 1409  ? PLT 262 05/12/2021 1551  ? MCV 91.6 08/07/2021 1409  ? MCV 92 05/12/2021 1551  ? MCH 29.1 08/07/2021 1409  ? MCHC 31.8 08/07/2021 1409  ? RDW 14.7 08/07/2021 1409  ? RDW 15.1 05/12/2021 1551  ? LYMPHSABS 0.8 03/18/2021 0205  ? MONOABS 0.7 03/18/2021 0205  ? EOSABS 0.2 03/18/2021 0205  ? BASOSABS 0.0 03/18/2021 0205  ? ? ?ASSESSMENT AND PLAN: ?1. Superficial venous thrombosis of left arm ?Explained to patient the difference between a superficial vein thrombus versus DVT.  More concerning with DVTs especially DVT in the lower extremities.  It would have been good to know the distance  that this superficial venous thrombosis is from the deep vein in the upper extremity.  With distance of less than 5 cm, we tend to use low-dose prophylactic treatment with Xarelto 10 mg for about 6 weeks for lo

## 2021-08-20 NOTE — Telephone Encounter (Signed)
Called patient this am to confirm appt. Patient's husband reports he is aware and already confirmed appt this am at 0800.

## 2021-08-20 NOTE — Progress Notes (Signed)
Follow up left arm blood clot left arm. ?

## 2021-08-20 NOTE — Patient Instructions (Signed)
Keep arm elevated. ?Warm compresses. ?Take low dose Aspirin 81 mg.  ?Repeat Ultra sound in 2 weeks.  ?Will need bone density study to screen for osteoporosis. ? ?

## 2021-08-21 ENCOUNTER — Telehealth: Payer: Self-pay | Admitting: Internal Medicine

## 2021-08-21 ENCOUNTER — Other Ambulatory Visit: Payer: Self-pay | Admitting: Internal Medicine

## 2021-08-21 LAB — COMPREHENSIVE METABOLIC PANEL
ALT: 23 IU/L (ref 0–32)
AST: 63 IU/L — ABNORMAL HIGH (ref 0–40)
Albumin/Globulin Ratio: 1.2 (ref 1.2–2.2)
Albumin: 3.8 g/dL (ref 3.8–4.8)
Alkaline Phosphatase: 147 IU/L — ABNORMAL HIGH (ref 44–121)
BUN/Creatinine Ratio: 10 — ABNORMAL LOW (ref 12–28)
BUN: 8 mg/dL (ref 8–27)
Bilirubin Total: 0.4 mg/dL (ref 0.0–1.2)
CO2: 27 mmol/L (ref 20–29)
Calcium: 8.7 mg/dL (ref 8.7–10.3)
Chloride: 101 mmol/L (ref 96–106)
Creatinine, Ser: 0.77 mg/dL (ref 0.57–1.00)
Globulin, Total: 3.2 g/dL (ref 1.5–4.5)
Glucose: 103 mg/dL — ABNORMAL HIGH (ref 70–99)
Potassium: 3.6 mmol/L (ref 3.5–5.2)
Sodium: 143 mmol/L (ref 134–144)
Total Protein: 7 g/dL (ref 6.0–8.5)
eGFR: 84 mL/min/{1.73_m2} (ref >59)

## 2021-08-21 LAB — PT AND PTT
INR: 1 (ref 0.9–1.2)
Prothrombin Time: 10.8 s (ref 9.1–12.0)
aPTT: 26 s (ref 24–33)

## 2021-08-21 LAB — CBC
Hematocrit: 29.6 % — ABNORMAL LOW (ref 34.0–46.6)
Hemoglobin: 9.3 g/dL — ABNORMAL LOW (ref 11.1–15.9)
MCH: 26.8 pg (ref 26.6–33.0)
MCHC: 31.4 g/dL — ABNORMAL LOW (ref 31.5–35.7)
MCV: 85 fL (ref 79–97)
Platelets: 384 10*3/uL (ref 150–450)
RBC: 3.47 x10E6/uL — ABNORMAL LOW (ref 3.77–5.28)
RDW: 14.6 % (ref 11.7–15.4)
WBC: 11.5 10*3/uL — ABNORMAL HIGH (ref 3.4–10.8)

## 2021-08-21 MED ORDER — FERROUS SULFATE 325 (65 FE) MG PO TABS: 325.0000 mg | ORAL_TABLET | Freq: Every day | ORAL | 0 refills | Status: DC

## 2021-08-21 NOTE — Telephone Encounter (Signed)
Phone call placed to patient today to go over lab results from yesterday.  I left a message on her husband's voicemail informing them that blood test shows that she is more anemic compared to when she left the hospital.  I suspect this is anemia due to blood loss during her surgery.  I recommend starting an iron supplement and taking it daily for 1 month.  Informed that I will send a prescription to her pharmacy.  She had some mild elevation in her liver function test which we will observe for now. ?

## 2021-08-24 ENCOUNTER — Ambulatory Visit: Payer: Medicare Other | Admitting: Internal Medicine

## 2021-08-27 ENCOUNTER — Inpatient Hospital Stay (HOSPITAL_COMMUNITY): Admission: RE | Admit: 2021-08-27 | Payer: Medicare Other | Source: Ambulatory Visit

## 2021-08-31 ENCOUNTER — Ambulatory Visit (HOSPITAL_COMMUNITY)
Admission: RE | Admit: 2021-08-31 | Discharge: 2021-08-31 | Disposition: A | Discharge status: Home / Self Care | Payer: Medicare Other | Source: Ambulatory Visit | Attending: Internal Medicine | Admitting: Internal Medicine

## 2021-08-31 DIAGNOSIS — R600 Localized edema: Secondary | ICD-10-CM | POA: Diagnosis not present

## 2021-08-31 DIAGNOSIS — M79602 Pain in left arm: Secondary | ICD-10-CM

## 2021-08-31 DIAGNOSIS — I82612 Acute embolism and thrombosis of superficial veins of left upper extremity: Secondary | ICD-10-CM | POA: Diagnosis not present

## 2021-09-01 ENCOUNTER — Telehealth: Payer: Self-pay | Admitting: Internal Medicine

## 2021-09-01 NOTE — Telephone Encounter (Signed)
PC placed x 2 to pt this evening to go over results of recent doppler US LT arm.  I got VM of her spouse Richardson Landry.  I left VMM informing of who I am and reason for calling. Informed that study was unchanged from previous.  Infomred that I will try to reach them again tomorrow morning. ?

## 2021-09-02 ENCOUNTER — Telehealth: Payer: Self-pay | Admitting: Internal Medicine

## 2021-09-02 DIAGNOSIS — D649 Anemia, unspecified: Secondary | ICD-10-CM

## 2021-09-02 NOTE — Telephone Encounter (Signed)
Phone call placed to patient again today.  I left another message on the voicemail for her spouse letting them know that the ultrasound shows that the clot in the superficial vein is unchanged from previous.  I would like to know how she is doing and whether the swelling in the arm is less.  If it is not we can refer her to a hematologist.  Also I would like for her to return to the laboratory in about 2 weeks for recheck of her blood count. ?

## 2021-09-03 ENCOUNTER — Telehealth: Payer: Self-pay | Admitting: Internal Medicine

## 2021-09-03 NOTE — Telephone Encounter (Signed)
Letter to be sent to pt. ?

## 2021-09-07 NOTE — Telephone Encounter (Signed)
Letter has been printed  ?

## 2021-09-23 ENCOUNTER — Ambulatory Visit: Payer: Medicare Other | Admitting: Gastroenterology

## 2021-09-23 ENCOUNTER — Encounter: Payer: Self-pay | Admitting: Gastroenterology

## 2021-09-23 VITALS — BP 138/74 | HR 72 | Ht 63.0 in | Wt 185.5 lb

## 2021-09-23 DIAGNOSIS — Z86010 Personal history of colonic polyps: Secondary | ICD-10-CM | POA: Diagnosis not present

## 2021-09-23 DIAGNOSIS — K6389 Other specified diseases of intestine: Secondary | ICD-10-CM

## 2021-09-23 MED ORDER — CLENPIQ 10-3.5-12 MG-GM -GM/160ML PO SOLN: 1.0000 | ORAL | 0 refills | Status: DC

## 2021-09-23 NOTE — Progress Notes (Signed)
Chief Complaint:    Colon polyp surveillance, pre-operative evaluation  GI History: 66 year old female with a history of, depression, polysubstance abuse, EtOH use disorder, h/o SI with admission in 01/2021, initially seen in the GI clinic on 08/07/2018 for evaluation of abnormal CT.  - 06/02/2020: CT abdomen/pelvis: Soft tissue prominence in the proximal descending colon, slight narrowing of sigmoid colon without obstruction.  Hepatic steatosis - 09/17/2020: Colonoscopy: 15 mm polyp in ascending colon (path: Tubular adenoma), 12 mm polyp in descending colon (path: Tubular adenoma), 18 mm sigmoid polyp (path: Tubular adenoma), 2 small rectal polyps (path: Tubular adenomas).  Sigmoid mass 4 cm in length (path: Tubulovillous adenoma).  Internal hemorrhoids, looping in ascending colon - 09/17/2020: CEA normal - Ordered CT chest (patient never completed), and was referred to Colorectal Surgery, but only went ot see him in 06/2021  Admitted in 64/4034 with alcoholic pancreatitis, alcoholic hepatitis, AKI, and hematemesis -03/13/2021: CT A/P: Severe fatty infiltration of the liver without duct dilation, mild pancreatitis without PD dilation.  Moderate size hiatal hernia.  5.4 x 4 cm soft tissue density in the sigmoid colon without obstruction -03/14/2021: EGD (Inpatient for evaluation of coffee-ground emesis with mild acute EtOH pancreatitis and EtOH hepatitis): Medium size hiatal hernia with esophageal tortuosity without Cameron's erosions.  Minimal gastritis (path benign)  -07/03/2021 seen in follow-up by Dr. Dema Severin at Colorectal Surgery.  Recommends repeat endoscopic evaluation with repeat biopsies prior to likely plan for robotic assisted sigmoidectomy/LAR  HPI:     Patient is a 66 y.o. female presenting to the Gastroenterology Clinic for follow-up and to discuss repeat colonoscopy for evaluation of known sigmoid mass.   Had left shoulder reconstructive surgery in April. Has f/u with Orthopedic surgeon  this week, then with PCM next week.   Otherwise, no active GI symptoms.  Review of systems:     No chest pain, no SOB, no fevers, no urinary sx   Past Medical History:  Diagnosis Date   Anxiety    Depression    GERD (gastroesophageal reflux disease)    Opiate abuse, continuous (HCC)    Pancreatitis, acute    Polysubstance abuse ([place])     Patient's surgical history, family medical history, social history, medications and allergies were all reviewed in Epic    Current Outpatient Medications  Medication Sig Dispense Refill   citalopram (CELEXA) 20 MG tablet Take 1 tablet (20 mg total) by mouth at bedtime. 30 tablet 6   ferrous sulfate 325 (65 FE) MG tablet Take 1 tablet (325 mg total) by mouth daily with breakfast. 30 tablet 0   methocarbamol (ROBAXIN) 500 MG tablet Take 1 tablet (500 mg total) by mouth every 6 (six) hours as needed for muscle spasms. 45 tablet 1   Multiple Vitamins-Minerals (MULTIVITAMIN WITH MINERALS) tablet Take 1 tablet by mouth daily.     ondansetron (ZOFRAN) 4 MG tablet Take 1 tablet (4 mg total) by mouth daily as needed for nausea or vomiting. 20 tablet 1   oxycodone (OXY-IR) 5 MG capsule Take 5 mg by mouth every 4 (four) hours as needed.     No current facility-administered medications for this visit.    Physical Exam:     BP 138/74   Pulse 72   Ht _0  (1.6 m)   Wt 185 lb 8 oz (84.1 kg)   BMI 32.86 kg/m   GENERAL:  Pleasant female in NAD PSYCH: : Cooperative, normal affect SKIN:  turgor, no lesions seen Musculoskeletal: Left arm in sling NEURO:  Alert and oriented x 3, no focal neurologic deficits   IMPRESSION and PLAN:    1) Sigmoid colon tubulovillous adenoma 2) History of colon polyps  - Repeat colonoscopy now to reevaluate known large sigmoid polyp and evaluate for stability of TVA vs interval conversion to Adenocarcinoma prior to planned sigmoid resection with Dr. Dema Severin -Patient will get clearance from her Orthopedic Surgeon at her  appointment later this week about laying on the left side for colonoscopy -To follow-up with Dr. Dema Severin after completion colonoscopy - Decision of repeat imaging per her surgeon  3) History of polysubstance abuse 4) History of EtOH use disorder - Will require MAC sedation   The indications, risks, and benefits of colonoscopy were explained to the patient in detail. Risks include but are not limited to bleeding, perforation, adverse reaction to medications, and cardiopulmonary compromise. Sequelae include but are not limited to the possibility of surgery, hospitalization, and mortality. The patient verbalized understanding and wished to proceed. All questions answered, referred to the scheduler and bowel prep ordered. Further recommendations pending results of the exam.        Lavena Bullion ,DO, FACG 09/23/2021, 8:52 AM

## 2021-09-23 NOTE — Patient Instructions (Addendum)
If you are age 68 or older, your body mass index should be between 23-30. Your Body mass index is 32.86 kg/m. If this is out of the aforementioned range listed, please consider follow up with your Primary Care Provider.  __________________________________________________________  The [facility] providers would like to encourage you to use [facility] to communicate with providers for non-urgent requests or questions.  Due to long hold times on the telephone, sending your provider a message by [facility] may be a faster and more efficient way to get a response.  Please allow 48 business hours for a response.  Please remember that this is for non-urgent requests.   You have been scheduled for a colonoscopy. Please follow written instructions given to you at your visit today.  Please pick up your prep supplies at the pharmacy within the next 1-3 days. If you use inhalers (even only as needed), please bring them with you on the day of your procedure.  Hold your Iron for 7 days prior to the procedure.  We have sent the following medications to your pharmacy for you to pick up at your convenience: Clenpiq  Thank you for choosing me and [facility].  Vito Cirigliano, D.O.

## 2021-09-25 DIAGNOSIS — Z4789 Encounter for other orthopedic aftercare: Secondary | ICD-10-CM | POA: Diagnosis not present

## 2021-10-01 ENCOUNTER — Ambulatory Visit: Payer: Medicare Other | Attending: Internal Medicine | Admitting: Internal Medicine

## 2021-10-01 ENCOUNTER — Encounter: Payer: Self-pay | Admitting: Internal Medicine

## 2021-10-01 VITALS — BP 127/71 | HR 74 | Temp 98.4°F | Resp 16 | Wt 188.6 lb

## 2021-10-01 DIAGNOSIS — K6389 Other specified diseases of intestine: Secondary | ICD-10-CM | POA: Diagnosis not present

## 2021-10-01 DIAGNOSIS — I82612 Acute embolism and thrombosis of superficial veins of left upper extremity: Secondary | ICD-10-CM | POA: Diagnosis not present

## 2021-10-01 DIAGNOSIS — D649 Anemia, unspecified: Secondary | ICD-10-CM | POA: Diagnosis not present

## 2021-10-01 DIAGNOSIS — F411 Generalized anxiety disorder: Secondary | ICD-10-CM | POA: Diagnosis not present

## 2021-10-01 DIAGNOSIS — F1011 Alcohol abuse, in remission: Secondary | ICD-10-CM

## 2021-10-01 MED ORDER — HYDROXYZINE PAMOATE 25 MG PO CAPS: 25.0000 mg | ORAL_CAPSULE | Freq: Every day | ORAL | 1 refills | Status: DC | PRN

## 2021-10-01 NOTE — Progress Notes (Signed)
Patient is here with husband. Patient had surgery in here left shoulder. Patient is doing as well as to be expected.

## 2021-10-01 NOTE — Progress Notes (Signed)
Patient ID: Debra Jackson, female    DOB: [birth year]  MRN: [id number]  CC: Follow-up   Subjective: Debra Jackson is a 66 y.o. female who presents for chronic disease management.  Her husband is with her. Her concerns today include:  Patient with history of ETOH pancreatitis, MDD, ADHD, colonic mass, multiple tubular adenomatous polyps, hx of drug overdose with clonazepam 01/2021, polysubstance abuse   Superficial venous thrombosis: Patient had repeat ultrasound of the left upper extremity 2 weeks after her last visit with me.  This showed that the clot in the superficial vein was unchanged from previous.  I had called and left a message on her husband's cell phone.  He acknowledges receiving the message.  They did not get the letter that I had sent. Today she reports that the swelling in the left arm has decreased.  Orthopedic is pleased with the way things are progressing.  She has been shown exercises to do at home which she has been doing.  She is now able to elevate the left arm to about 90 degrees. On last visit with me, she was found to have postoperative anemia with hemoglobin around 11.  This had decreased to 9.3 when I had checked it.  I had left a message recommending that she take iron supplement 1 tablet daily for 1 month.  She did so.  We plan to recheck CBC today. With the recent surgery that she has had, she reports that her anxiety has increased.  She is taking the Celexa.  She reports being told by the gastroenterologist to stop all of her medications by 10/09/21 in preparation for colonoscopy.  This makes her even more nervous knowing that she has to stop the Celexa.  Has not smoked in years, but due to increased anxiety, she smoked 2 cigarettes a few weeks ago but nothing since then. Colon mass:  saw Dr. Dema Severin.  Plan is for rectal surgery but has to have repeat  c-scope first that is scheduled for 10/15/2021  ETOH: reports having a couple of beers several mths ago but nothing  since then.   HM: Overdue for mammogram.  It was ordered in January of this year.  Patient decided to defer on getting this done until she has gone through colonoscopy and possibly rectal surgery Patient Active Problem List   Diagnosis Date Noted   Diarrhea    Hypokalemia    Colonic mass    Hematemesis with nausea    Acute pancreatitis 03/13/2021   Pancreatitis 03/13/2021   MDD (major depressive disorder), recurrent episode, severe ([place]) 02/10/2021   ADHD 02/10/2021   Major depressive disorder, recurrent ([place]) 02/09/2021   Suicide attempt ([place])      Current Outpatient Medications on File Prior to Visit  Medication Sig Dispense Refill   citalopram (CELEXA) 20 MG tablet Take 1 tablet (20 mg total) by mouth at bedtime. 30 tablet 6   ferrous sulfate 325 (65 FE) MG tablet Take 1 tablet (325 mg total) by mouth daily with breakfast. 30 tablet 0   methocarbamol (ROBAXIN) 500 MG tablet Take 1 tablet (500 mg total) by mouth every 6 (six) hours as needed for muscle spasms. 45 tablet 1   Multiple Vitamins-Minerals (MULTIVITAMIN WITH MINERALS) tablet Take 1 tablet by mouth daily.     ondansetron (ZOFRAN) 4 MG tablet Take 1 tablet (4 mg total) by mouth daily as needed for nausea or vomiting. 20 tablet 1   oxycodone (OXY-IR) 5 MG capsule Take 5 mg  by mouth every 4 (four) hours as needed.     Sod Picosulfate-Mag Ox-Cit Acd (CLENPIQ) 10-3.5-12 MG-GM -GM/160ML SOLN Take 1 kit by mouth as directed. 320 mL 0   No current facility-administered medications on file prior to visit.    Allergies  Allergen Reactions   Penicillins Nausea And Vomiting    Social History   Socioeconomic History   Marital status: Single    Spouse name: Not on file   Number of children: Not on file   Years of education: Not on file   Highest education level: Not on file  Occupational History   Not on file  Tobacco Use   Smoking status: Former    Types: Cigarettes   Smokeless tobacco: Never  Vaping Use   Vaping Use:  Never used  Substance and Sexual Activity   Alcohol use: Not Currently    Comment: rarely-slowed down from 6 months ago.   Drug use: Not Currently    Types: Cocaine   Sexual activity: Not on file  Other Topics Concern   Not on file  Social History Narrative   Not on file   Social Determinants of Health   Financial Resource Strain: Not on file  Food Insecurity: Not on file  Transportation Needs: Not on file  Physical Activity: Not on file  Stress: Not on file  Social Connections: Not on file  Intimate Partner Violence: Not on file    Family History  Adopted: Yes  Family history unknown: Yes    Past Surgical History:  Procedure Laterality Date   BIOPSY  03/14/2021   Procedure: BIOPSY;  Surgeon: Milus Banister, MD;  Location: [facility];  Service: Endoscopy;;   ESOPHAGOGASTRODUODENOSCOPY (EGD) WITH PROPOFOL N/A 03/14/2021   Procedure: ESOPHAGOGASTRODUODENOSCOPY (EGD) WITH PROPOFOL;  Surgeon: Milus Banister, MD;  Location: [facility];  Service: Endoscopy;  Laterality: N/A;   REVERSE SHOULDER ARTHROPLASTY Left 08/10/2021   Procedure: REVERSE SHOULDER ARTHROPLASTY;  Surgeon: Nicholes Stairs, MD;  Location: [facility];  Service: Orthopedics;  Laterality: Left;   TONSILLECTOMY      ROS: Review of Systems Negative except as stated above  PHYSICAL EXAM: BP 127/71   Pulse 74   Temp 98.4 F (36.9 C) (Oral)   Resp 16   Wt 188 lb 9.6 oz (85.5 kg)   SpO2 95%   BMI 33.41 kg/m   Wt Readings from Last 3 Encounters:  10/01/21 188 lb 9.6 oz (85.5 kg)  09/23/21 185 lb 8 oz (84.1 kg)  08/20/21 187 lb (84.8 kg)    Physical Exam   General appearance - alert, well appearing, and in no distress Mental status -patient seems a little nervous.  She is observed fine tremors in both hands at times Neck - supple, no significant adenopathy Chest - clear to auscultation, no wheezes, rales or rhonchi, symmetric air entry Heart - normal rate, regular rhythm, normal S1, S2, no  murmurs, rubs, clicks or gallops Extremities -left upper extremity: Edema has significantly decreased compared to when I last saw her 08/20/2021.  Arm is in a sling.  She did take her arm out of the sling and showed me the movements that she is now able to do.    05/12/2021    3:08 PM  GAD 7 : Generalized Anxiety Score  Nervous, Anxious, on Edge 2  Control/stop worrying 2  Worry too much - different things 1  Trouble relaxing 1  Restless 2  Easily annoyed or irritable 1  Afraid - awful  might happen 2  Total GAD 7 Score 11       10/01/2021   11:07 AM 05/12/2021    3:07 PM  Depression screen PHQ 2/9  Decreased Interest 0 1  Down, Depressed, Hopeless 0 1  PHQ - 2 Score 0 2  Altered sleeping 0 3  Tired, decreased energy 0 2  Change in appetite 0 1  Feeling bad or failure about yourself  0 2  Trouble concentrating 0 2  Moving slowly or fidgety/restless 0 2  Suicidal thoughts 0 0  PHQ-9 Score 0 14  Difficult doing work/chores Not difficult at all        Latest Ref Rng & Units 08/20/2021   11:51 AM 08/07/2021    2:09 PM 05/12/2021    3:51 PM  CMP  Glucose 70 - 99 mg/dL 103  119  89   BUN 8 - 27 mg/dL _0 Creatinine 0.57 - 1.00 mg/dL 0.77  0.84  0.70   Sodium 134 - 144 mmol/L 143  138  138   Potassium 3.5 - 5.2 mmol/L 3.6  3.2  3.7   Chloride 96 - 106 mmol/L 101  103  99   CO2 20 - 29 mmol/L _1 Calcium 8.7 - 10.3 mg/dL 8.7  9.0  9.8   Total Protein 6.0 - 8.5 g/dL 7.0  7.6  7.3   Total Bilirubin 0.0 - 1.2 mg/dL 0.4  0.9  0.3   Alkaline Phos 44 - 121 IU/L 147  99  148   AST 0 - 40 IU/L 63  79  44   ALT 0 - 32 IU/L 23  34  18    Lipid Panel     Component Value Date/Time   CHOL 241 (H) 02/11/2021 0711   TRIG 83 02/11/2021 0711   HDL 52 02/11/2021 0711   CHOLHDL 4.6 02/11/2021 0711   VLDL 17 02/11/2021 0711   LDLCALC 172 (H) 02/11/2021 0711    CBC    Component Value Date/Time   WBC 11.5 (H) 08/20/2021 1151   WBC 8.4 08/07/2021 1409   RBC 3.47 (L)  08/20/2021 1151   RBC 3.95 08/07/2021 1409   HGB 9.3 (L) 08/20/2021 1151   HCT 29.6 (L) 08/20/2021 1151   PLT 384 08/20/2021 1151   MCV 85 08/20/2021 1151   MCH 26.8 08/20/2021 1151   MCH 29.1 08/07/2021 1409   MCHC 31.4 (L) 08/20/2021 1151   MCHC 31.8 08/07/2021 1409   RDW 14.6 08/20/2021 1151   LYMPHSABS 0.8 03/18/2021 0205   MONOABS 0.7 03/18/2021 0205   EOSABS 0.2 03/18/2021 0205   BASOSABS 0.0 03/18/2021 0205    ASSESSMENT AND PLAN: 1. Superficial venous thrombosis of left arm Advised patient that the repeat ultrasound showed no advancement of the superficial clot towards the deep veins.  She has been taking a baby aspirin daily as instructed.  Swelling has decreased significantly.  We will continue to observe.  2. Normocytic anemia Plan to recheck CBC today with iron studies - CBC - Iron, TIBC and Ferritin Panel  3. GAD (generalized anxiety disorder) Continue Celexa.  We will add daily dose of hydroxyzine as needed as needed warned that the medication can cause drowsiness. - hydrOXYzine (VISTARIL) 25 MG capsule; Take 1 capsule (25 mg total) by mouth daily as needed for anxiety.  Dispense: 30 capsule; Refill: 1  4. Colonic mass Plan for repeat colonoscopy later this month  5.  Alcohol use disorder, mild, in early remission Commended her.  Encouraged her to remain free of alcohol.     Patient was given the opportunity to ask questions.  Patient verbalized understanding of the plan and was able to repeat key elements of the plan.   This documentation was completed using Radio producer.  Any transcriptional errors are unintentional.  No orders of the defined types were placed in this encounter.    Requested Prescriptions    No prescriptions requested or ordered in this encounter    No follow-ups on file.  Karle Plumber, MD, FACP

## 2021-10-02 LAB — CBC
Hematocrit: 35.9 % (ref 34.0–46.6)
Hemoglobin: 11.5 g/dL (ref 11.1–15.9)
MCH: 27.8 pg (ref 26.6–33.0)
MCHC: 32 g/dL (ref 31.5–35.7)
MCV: 87 fL (ref 79–97)
Platelets: 225 10*3/uL (ref 150–450)
RBC: 4.13 x10E6/uL (ref 3.77–5.28)
RDW: 18.5 % — ABNORMAL HIGH (ref 11.7–15.4)
WBC: 7.3 10*3/uL (ref 3.4–10.8)

## 2021-10-02 LAB — IRON,TIBC AND FERRITIN PANEL
Ferritin: 54 ng/mL (ref 15–150)
Iron Saturation: 21 % (ref 15–55)
Iron: 79 ug/dL (ref 27–139)
Total Iron Binding Capacity: 382 ug/dL (ref 250–450)
UIBC: 303 ug/dL (ref 118–369)

## 2021-10-08 ENCOUNTER — Encounter: Payer: Self-pay | Admitting: Gastroenterology

## 2021-10-11 ENCOUNTER — Encounter: Payer: Self-pay | Admitting: Certified Registered Nurse Anesthetist

## 2021-10-15 ENCOUNTER — Encounter: Payer: Medicare Other | Admitting: Gastroenterology

## 2021-10-22 ENCOUNTER — Telehealth: Payer: Self-pay | Admitting: Gastroenterology

## 2021-10-22 MED ORDER — SUTAB 1479-225-188 MG PO TABS
1.0000 | ORAL_TABLET | Freq: Once | ORAL | 0 refills | Status: AC
Start: 2021-10-22 — End: 2021-10-22

## 2021-10-22 NOTE — Telephone Encounter (Signed)
Spoke with the patient. Stated she is coming on 10/26/21 at our office around 10 am to 11 am to go over the new prep instruction for the upcoming colonoscopy which is scheduled on 10/28/21. Nila Nephew is already sent to the pharmacy. Stated she has already stopped eating nuts, seeds, salads etc. and would pick up [facility] tomorrow. Stated she cancelled her procedure on 10/15/21 for not being to tolerate the Clenpiq.

## 2021-10-22 NOTE — Telephone Encounter (Signed)
Patient called states she is having trouble with her prep medication seeking a pill form if possible.

## 2021-10-28 ENCOUNTER — Encounter: Payer: Medicare Other | Admitting: Gastroenterology

## 2021-10-28 ENCOUNTER — Telehealth: Payer: Self-pay | Admitting: Gastroenterology

## 2021-10-28 NOTE — Telephone Encounter (Signed)
Patient called to let the office know she will not be able to attend procedure appointment today because she has a fever from a virus requested to cancel. The patient was made aware of cancellation fee.

## 2021-11-11 DIAGNOSIS — Z4789 Encounter for other orthopedic aftercare: Secondary | ICD-10-CM | POA: Diagnosis not present

## 2021-12-16 ENCOUNTER — Other Ambulatory Visit: Payer: Self-pay | Admitting: Internal Medicine

## 2022-01-05 DIAGNOSIS — Z96612 Presence of left artificial shoulder joint: Secondary | ICD-10-CM | POA: Diagnosis not present

## 2022-02-22 ENCOUNTER — Encounter: Payer: Self-pay | Admitting: Gastroenterology

## 2022-05-04 DIAGNOSIS — Z96612 Presence of left artificial shoulder joint: Secondary | ICD-10-CM | POA: Diagnosis not present

## 2022-05-25 ENCOUNTER — Telehealth: Payer: Self-pay | Admitting: Internal Medicine

## 2022-05-25 NOTE — Telephone Encounter (Signed)
Tried calling patient to schedule Medicare Annual Wellness Visit (AWV) either virtually or phone  No answer    my Debra Jackson number [phone]   awvi 06/25/19 per [facility]

## 2022-06-03 DIAGNOSIS — L298 Other pruritus: Secondary | ICD-10-CM | POA: Diagnosis not present

## 2022-06-03 DIAGNOSIS — Z789 Other specified health status: Secondary | ICD-10-CM | POA: Diagnosis not present

## 2022-06-03 DIAGNOSIS — D485 Neoplasm of uncertain behavior of skin: Secondary | ICD-10-CM | POA: Diagnosis not present

## 2022-06-03 DIAGNOSIS — L821 Other seborrheic keratosis: Secondary | ICD-10-CM | POA: Diagnosis not present

## 2022-06-03 DIAGNOSIS — L538 Other specified erythematous conditions: Secondary | ICD-10-CM | POA: Diagnosis not present

## 2022-06-03 DIAGNOSIS — B078 Other viral warts: Secondary | ICD-10-CM | POA: Diagnosis not present

## 2023-06-23 ENCOUNTER — Ambulatory Visit: Payer: Medicare Other | Attending: Internal Medicine | Admitting: Internal Medicine

## 2023-06-23 ENCOUNTER — Telehealth: Payer: Self-pay

## 2023-06-23 ENCOUNTER — Encounter: Payer: Self-pay | Admitting: Internal Medicine

## 2023-06-23 VITALS — BP 141/74 | HR 88 | Temp 98.3°F | Ht 63.0 in | Wt 166.0 lb

## 2023-06-23 DIAGNOSIS — Z1211 Encounter for screening for malignant neoplasm of colon: Secondary | ICD-10-CM | POA: Diagnosis not present

## 2023-06-23 DIAGNOSIS — Z5320 Procedure and treatment not carried out because of patient's decision for unspecified reasons: Secondary | ICD-10-CM

## 2023-06-23 DIAGNOSIS — Z2821 Immunization not carried out because of patient refusal: Secondary | ICD-10-CM

## 2023-06-23 DIAGNOSIS — Z0000 Encounter for general adult medical examination without abnormal findings: Secondary | ICD-10-CM

## 2023-06-23 DIAGNOSIS — F321 Major depressive disorder, single episode, moderate: Secondary | ICD-10-CM

## 2023-06-23 DIAGNOSIS — R030 Elevated blood-pressure reading, without diagnosis of hypertension: Secondary | ICD-10-CM

## 2023-06-23 DIAGNOSIS — H547 Unspecified visual loss: Secondary | ICD-10-CM | POA: Diagnosis not present

## 2023-06-23 DIAGNOSIS — Z6911 Encounter for mental health services for victim of spousal or partner abuse: Secondary | ICD-10-CM

## 2023-06-23 DIAGNOSIS — Z1231 Encounter for screening mammogram for malignant neoplasm of breast: Secondary | ICD-10-CM

## 2023-06-23 NOTE — Patient Instructions (Addendum)
 You have been referred for colonoscopy and to psychiatry/mental health services.  You have been referred for eye exam.

## 2023-06-23 NOTE — Progress Notes (Unsigned)
 Subjective:   Debra Jackson is a 68 y.o. female who presents for Medicare Annual (Subsequent) preventive examination. Patient with history of ETOH pancreatitis, MDD, ADHD, colonic mass, multiple tubular adenomatous polyps, hx of drug overdose with clonazepam 01/2021, polysubstance abuse   Visit Complete: In person  Patient Medicare AWV questionnaire was completed by the patient on ***; I have confirmed that all information answered by patient is correct and no changes since this date.  Cardiac Risk Factors include: advanced age (>55men, >68 women)     Objective:    Today's Vitals   06/23/23 1456  BP: (!) 141/74  Pulse: 88  Temp: 98.3 F (36.8 C)  TempSrc: Oral  SpO2: 95%  Weight: 166 lb (75.3 kg)  Height: 5\' 3"  (1.6 m)   Body mass index is 29.41 kg/m.     06/23/2023    2:41 PM 08/07/2021    2:22 PM 06/28/2021   11:13 AM 03/14/2021   12:09 PM 02/10/2021    4:00 PM 06/02/2020    2:50 AM 01/28/2020    4:11 PM  Advanced Directives  Does Patient Have a Medical Advance Directive? No No No Yes  No No  Type of Theme park manager;Living will     Copy of Healthcare Power of Attorney in Chart?    No - copy requested     Would patient like information on creating a medical advance directive? Yes (MAU/Ambulatory/Procedural Areas - Information given) No - Patient declined    No - Patient declined      Information is confidential and restricted. Go to Review Flowsheets to unlock data.   Pt thinks she has LW and HPOA.  Will bring copy for records . Current Medications (verified) Outpatient Encounter Medications as of 06/23/2023  Medication Sig   citalopram (CELEXA) 20 MG tablet TAKE 1 TABLET(20 MG) BY MOUTH AT BEDTIME   hydrOXYzine (VISTARIL) 25 MG capsule Take 1 capsule (25 mg total) by mouth daily as needed for anxiety.   methocarbamol (ROBAXIN) 500 MG tablet Take 1 tablet (500 mg total) by mouth every 6 (six) hours as needed for muscle spasms.    Multiple Vitamins-Minerals (MULTIVITAMIN WITH MINERALS) tablet Take 1 tablet by mouth daily.   oxycodone (OXY-IR) 5 MG capsule Take 5 mg by mouth every 4 (four) hours as needed.   Sod Picosulfate-Mag Ox-Cit Acd (CLENPIQ) 10-3.5-12 MG-GM -GM/160ML SOLN Take 1 kit by mouth as directed.   No facility-administered encounter medications on file as of 06/23/2023.    Allergies (verified) Penicillins   History: Past Medical History:  Diagnosis Date   Anxiety    Depression    GERD (gastroesophageal reflux disease)    Opiate abuse, continuous (HCC)    Pancreatitis, acute    Polysubstance abuse (HCC)    Past Surgical History:  Procedure Laterality Date   BIOPSY  03/14/2021   Procedure: BIOPSY;  Surgeon: Rachael Fee, MD;  Location: [facility] ENDOSCOPY;  Service: Endoscopy;;   ESOPHAGOGASTRODUODENOSCOPY (EGD) WITH PROPOFOL N/A 03/14/2021   Procedure: ESOPHAGOGASTRODUODENOSCOPY (EGD) WITH PROPOFOL;  Surgeon: Rachael Fee, MD;  Location: [facility] ENDOSCOPY;  Service: Endoscopy;  Laterality: N/A;   REVERSE SHOULDER ARTHROPLASTY Left 08/10/2021   Procedure: REVERSE SHOULDER ARTHROPLASTY;  Surgeon: Yolonda Kida, MD;  Location: [facility] OR;  Service: Orthopedics;  Laterality: Left;   TONSILLECTOMY     Family History  Adopted: Yes  Family history unknown: Yes   Social History   Socioeconomic History   Marital status:  Single    Spouse name: Not on file   Number of children: Not on file   Years of education: Not on file   Highest education level: Not on file  Occupational History   Not on file  Tobacco Use   Smoking status: Former    Types: Cigarettes   Smokeless tobacco: Never  Vaping Use   Vaping status: Never Used  Substance and Sexual Activity   Alcohol use: Not Currently    Comment: rarely-slowed down from 6 months ago.   Drug use: Not Currently    Types: Cocaine   Sexual activity: Not on file  Other Topics Concern   Not on file  Social History Narrative   Not on file    Social Drivers of Health   Financial Resource Strain: Low Risk  (06/23/2023)   Overall Financial Resource Strain (CARDIA)    Difficulty of Paying Living Expenses: Not very hard  Food Insecurity: No Food Insecurity (06/23/2023)   Hunger Vital Sign    Worried About Running Out of Food in the Last Year: Never true    Ran Out of Food in the Last Year: Never true  Transportation Needs: Unmet Transportation Needs (06/23/2023)   PRAPARE - Transportation    Lack of Transportation (Medical): Yes    Lack of Transportation (Non-Medical): Yes  Physical Activity: Inactive (06/23/2023)   Exercise Vital Sign    Days of Exercise per Week: 0 days    Minutes of Exercise per Session: 0 min  Stress: Stress Concern Present (06/23/2023)   [employer] of Occupational Health - Occupational Stress Questionnaire    Feeling of Stress : Very much  Social Connections: Moderately Isolated (06/23/2023)   Social Connection and Isolation Panel [NHANES]    Frequency of Communication with Friends and Family: Twice a week    Frequency of Social Gatherings with Friends and Family: Never    Attends Religious Services: More than 4 times per year    Active Member of [employer] or Organizations: No    Attends Banker Meetings: Never    Marital Status: Married    Tobacco Counseling Does not smoke but vapes once a mth  Clinical Intake:  How often do you need to have someone help you when you read instructions, pamphlets, or other written materials from your doctor or pharmacy?: 1 - Never  Interpreter Needed?: No      Activities of Daily Living    06/23/2023    2:42 PM  In your present state of health, do you have any difficulty performing the following activities:  Hearing? 1  Vision? 1  Difficulty concentrating or making decisions? 1  Walking or climbing stairs? 0  Dressing or bathing? 0  Doing errands, shopping? 0  Preparing Food and eating ? N  Using the Toilet? N  In the past six months,  have you accidently leaked urine? Y  Do you have problems with loss of bowel control? N  Managing your Medications? N  Managing your Finances? Y  Housekeeping or managing your Housekeeping? N    Patient Care Team: Marcine Matar, MD as PCP - General (Internal Medicine)  Indicate any recent Medical Services you may have received from other than [facility] providers in the past year (date may be approximate).     Assessment:   This is a routine wellness examination for Kadajah.  Hearing/Vision screen Vision Screening   Right eye Left eye Both eyes  Without correction 20/40 20/70 20/50   With correction  Goals Addressed   None   Depression Screen    06/23/2023    2:59 PM 10/01/2021   11:07 AM 08/20/2021   11:37 AM 05/12/2021    3:07 PM  PHQ 2/9 Scores  PHQ - 2 Score 2 0  2  PHQ- 9 Score 13 0  14  Exception Documentation   Patient refusal    Reports issues with depression for a while; feels she would benefit from seeing BH. No SI but does get angry with spouse but will not hurt him  No ETOH in 5 mths. Uses Cannibus Gums to help with sleep Fall Risk    06/23/2023    2:41 PM  Fall Risk   Falls in the past year? 0  Number falls in past yr: 0  Injury with Fall? 0  Risk for fall due to : No Fall Risks  Follow up Falls evaluation completed    MEDICARE RISK AT HOME: Medicare Risk at Home Any stairs in or around the home?: No If so, are there any without handrails?: No Home free of loose throw rugs in walkways, pet beds, electrical cords, etc?: Yes Adequate lighting in your home to reduce risk of falls?: Yes Life alert?: No Use of a cane, walker or w/c?: No Grab bars in the bathroom?: Yes Shower chair or bench in shower?: No Elevated toilet seat or a handicapped toilet?: No  TIMED UP AND GO:  Was the test performed?  {AMBTIMEDUPGO:[phone]}    Cognitive Function:    06/23/2023    2:51 PM  MMSE - Mini Mental State Exam  Orientation to time 5  Orientation to  Place 4  Registration 3  Attention/ Calculation 5  Recall 3  Language- name 2 objects 2  Language- repeat 1  Language- follow 3 step command 3  Language- read & follow direction 1  Write a sentence 1  Copy design 1  Total score 29        Immunizations Immunization History  Administered Date(s) Administered   PFIZER(Purple Top)SARS-COV-2 Vaccination 06/28/2019, 07/24/2019, 04/11/2020    {TDAP status:2101805}  {Flu Vaccine status:2101806}  {Pneumococcal vaccine status:2101807}  {Covid-19 vaccine status:2101808}  Qualifies for Shingles Vaccine? {YES/NO:21197}  Zostavax completed {YES/NO:21197}  {Shingrix Completed?:2101804}  Screening Tests Health Maintenance  Topic Date Due   DTaP/Tdap/Td (1 - Tdap) Never done   MAMMOGRAM  Never done   Zoster Vaccines- Shingrix (1 of 2) Never done   Pneumonia Vaccine 23+ Years old (1 of 1 - PCV) Never done   DEXA SCAN  Never done   Colonoscopy  09/17/2021   INFLUENZA VACCINE  Never done   COVID-19 Vaccine (4 - 2024-25 season) 12/26/2022   Medicare Annual Wellness (AWV)  06/22/2024   Hepatitis C Screening  Completed   HPV VACCINES  Aged Out    Health Maintenance  Health Maintenance Due  Topic Date Due   DTaP/Tdap/Td (1 - Tdap) Never done   MAMMOGRAM  Never done   Zoster Vaccines- Shingrix (1 of 2) Never done   Pneumonia Vaccine 41+ Years old (1 of 1 - PCV) Never done   DEXA SCAN  Never done   Colonoscopy  09/17/2021   INFLUENZA VACCINE  Never done   COVID-19 Vaccine (4 - 2024-25 season) 12/26/2022    {Colorectal cancer screening:2101809}  {Mammogram status:21018020}  {Bone Density status:21018021}  Lung Cancer Screening: (Low Dose CT Chest recommended if Age 69-80 years, 20 pack-year currently smoking OR have quit w/in 15years.) {DOES NOT does:27190::"does not"} qualify.  Lung Cancer Screening Referral: ***  Additional Screening:  Hepatitis C Screening: {DOES NOT does:27190::"does not"} qualify; Completed  ***  Vision Screening: Recommended annual ophthalmology exams for early detection of glaucoma and other disorders of the eye. Is the patient up to date with their annual eye exam?  {YES/NO:21197} Who is the provider or what is the name of the office in which the patient attends annual eye exams? *** If pt is not established with a provider, would they like to be referred to a provider to establish care? {YES/NO:21197}.   Dental Screening: Recommended annual dental exams for proper oral hygiene  Diabetic Foot Exam: {Diabetic Foot Exam:2101802}  Community Resource Referral / Chronic Care Management: CRR required this visit?  {YES/NO:21197}  CCM required this visit?  {CCM Required choices:[phone]}     Plan:     I have personally reviewed and noted the following in the patient's chart:   Medical and social history Use of alcohol, tobacco or illicit drugs  Current medications and supplements including opioid prescriptions. {Opioid Prescriptions:[phone]} Functional ability and status Nutritional status Physical activity Advanced directives List of other physicians Hospitalizations, surgeries, and ER visits in previous 12 months Vitals Screenings to include cognitive, depression, and falls Referrals and appointments  In addition, I have reviewed and discussed with patient certain preventive protocols, quality metrics, and best practice recommendations. A written personalized care plan for preventive services as well as general preventive health recommendations were provided to patient.     Jonah Blue, MD   06/23/2023   After Visit Summary: {CHL AMB AWV After Visit Summary:[phone]}  Nurse Notes: ***
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
## 2023-06-24 ENCOUNTER — Telehealth: Payer: Self-pay | Admitting: Internal Medicine
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
## 2023-06-27 NOTE — Telephone Encounter (Signed)
 FYI

## 2023-06-27 NOTE — Telephone Encounter (Signed)
 Patient inquiring about lost money again.Patient requesting a call back from manager.

## 2023-07-06 ENCOUNTER — Encounter: Payer: Self-pay | Admitting: Gastroenterology

## 2023-07-18 IMAGING — CT CT SHOULDER*L* W/O CM
3 series · 13 of 33 positions shown, 16 images · non-contrast
Comparison: None.

CLINICAL DATA: Status post fall, left arm pain

EXAM:
CT OF THE UPPER LEFT EXTREMITY WITHOUT CONTRAST
TECHNIQUE: Multidetector CT imaging of the upper left extremity was performed
according to the standard protocol.
RADIATION DOSE REDUCTION: This exam was performed according to the
departmental dose-optimization program which includes automated
exposure control, adjustment of the mA and/or kV according to
patient size and/or use of iterative reconstruction technique.

[Series 7: ax st · axial · 0.39mm/px · z∈[-242,-122]mm · 5 of 96 slices shown, 7 images]
[im 15/96  soft-tissue]
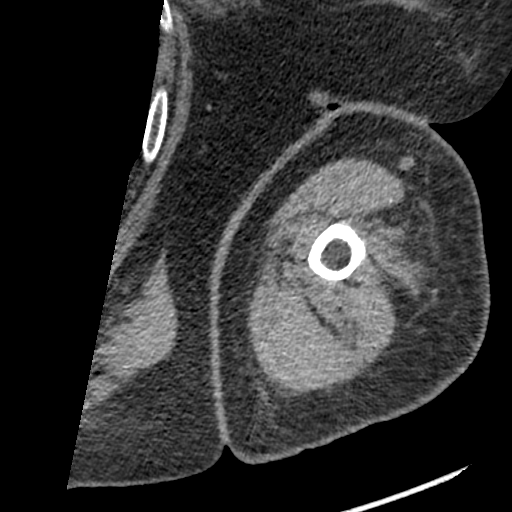
[im 15/96  bone]
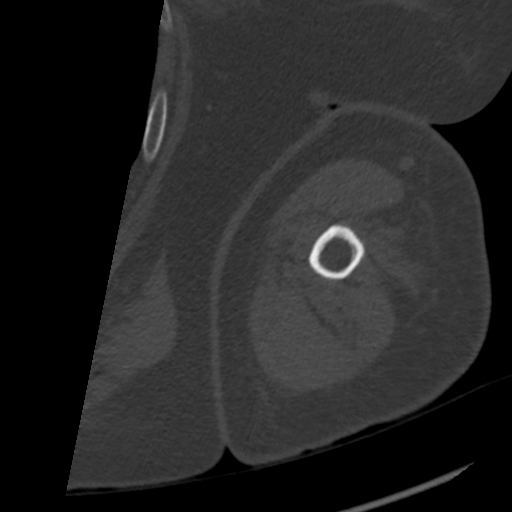
[im 30/96  bone]
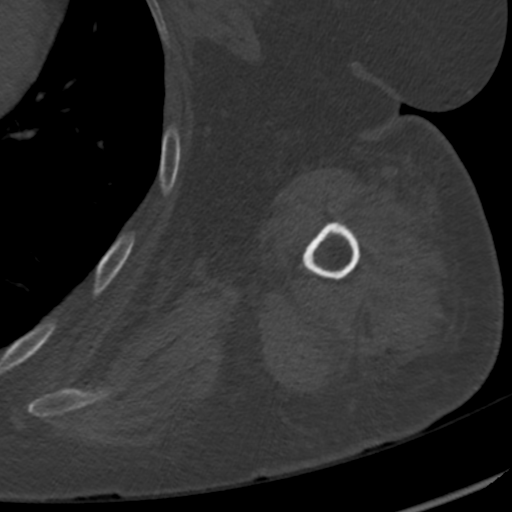
[im 52/96  bone]
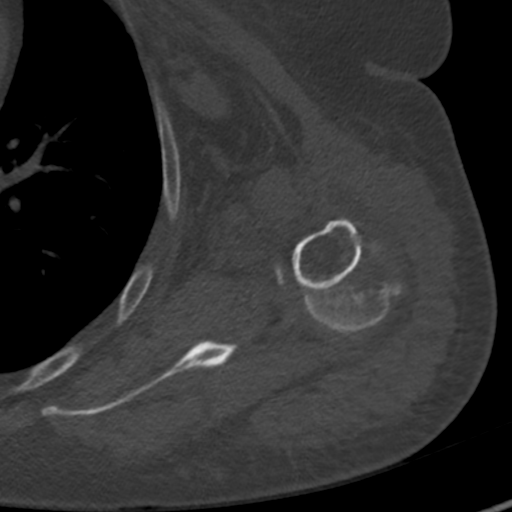
[im 66/96  bone]
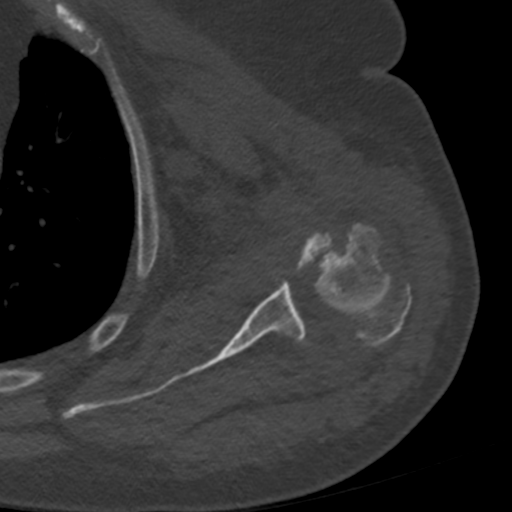
[im 81/96  soft-tissue]
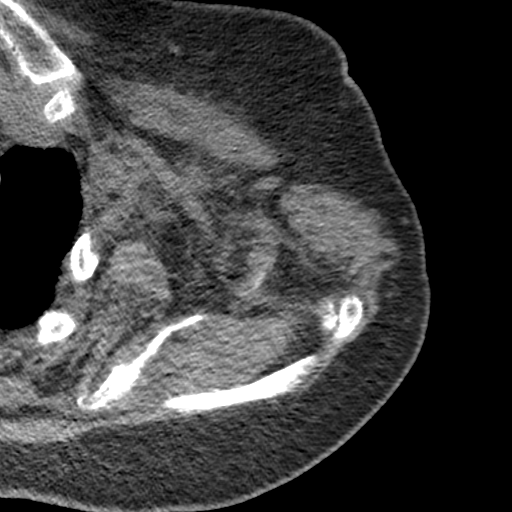
[im 81/96  bone]
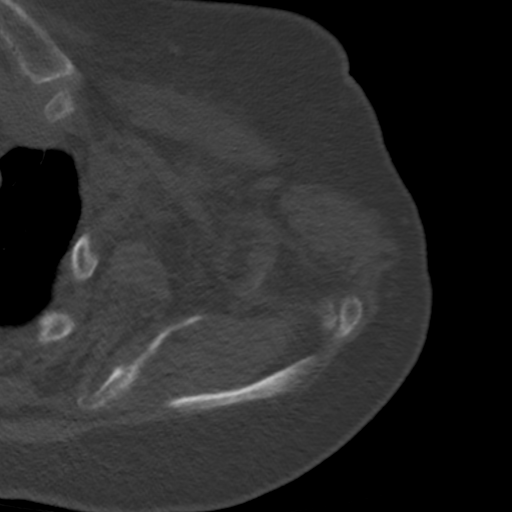

[Series 8: cor st · coronal · 0.34mm/px · 3 of 95 slices shown]
[im 19/95  bone]
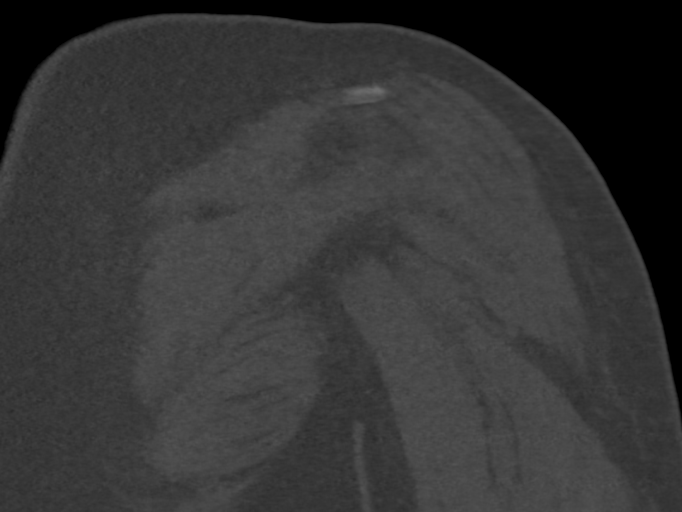
[im 38/95  bone]
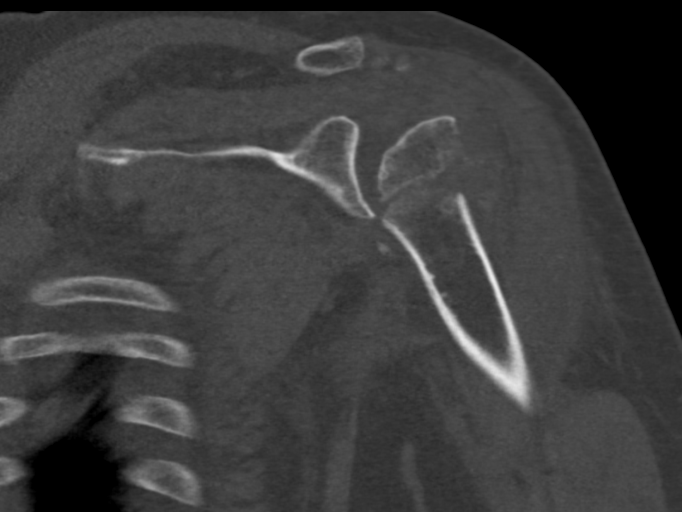
[im 57/95  bone]
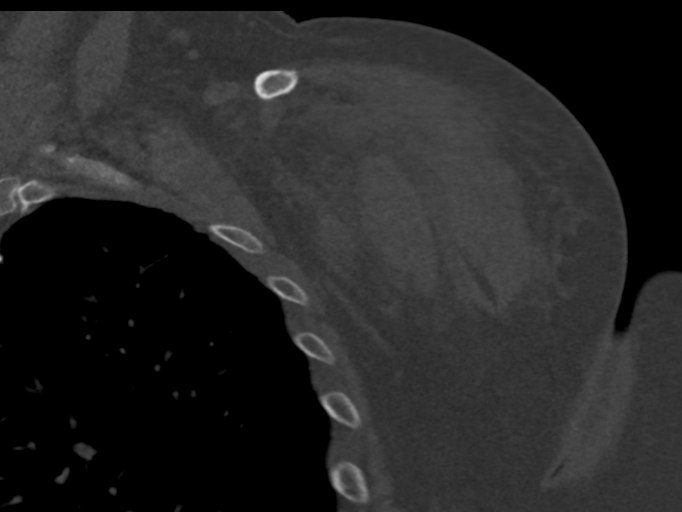

[Series 9: sag st · sagittal · 0.34mm/px · 5 of 101 slices shown, 6 images]
[im 34/101  bone]
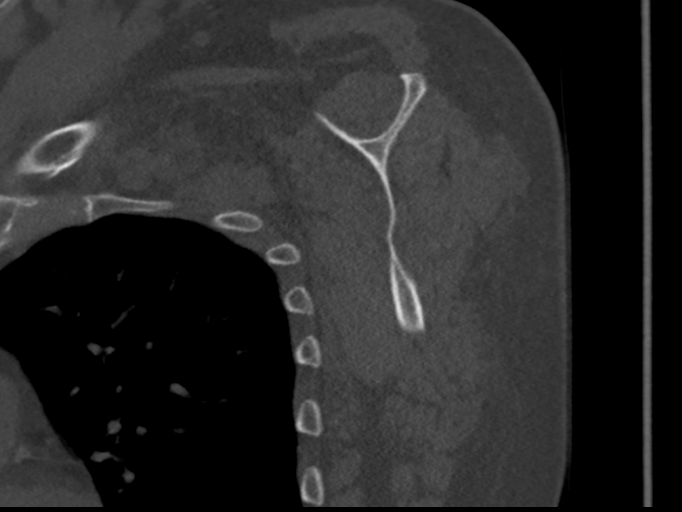
[im 42/101  bone]
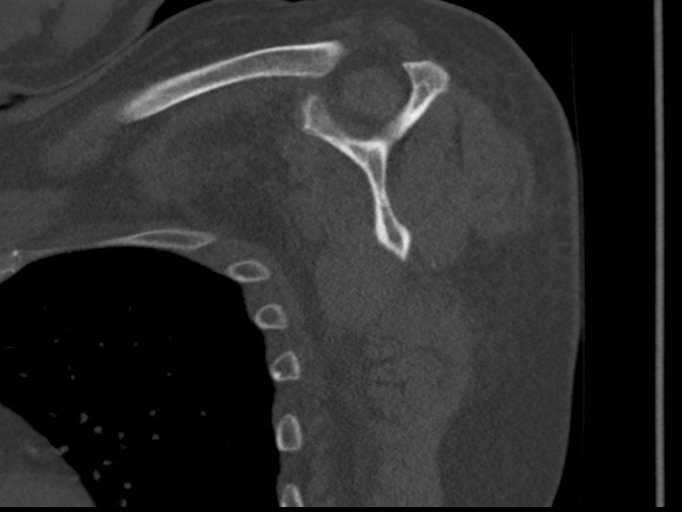
[im 51/101  soft-tissue]
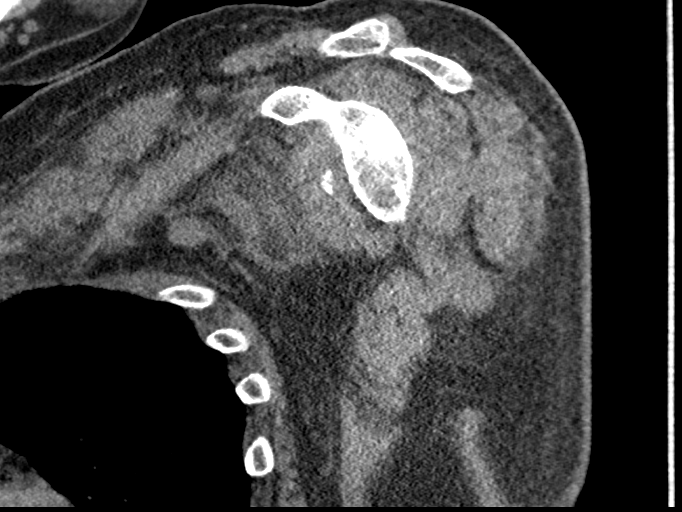
[im 51/101  bone]
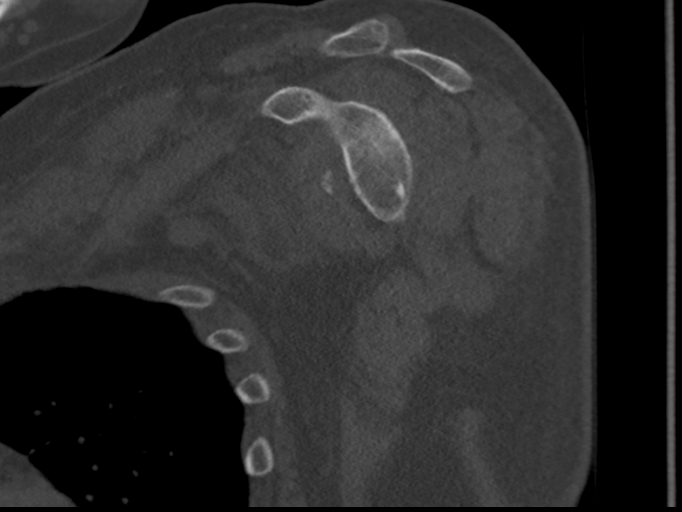
[im 59/101  bone]
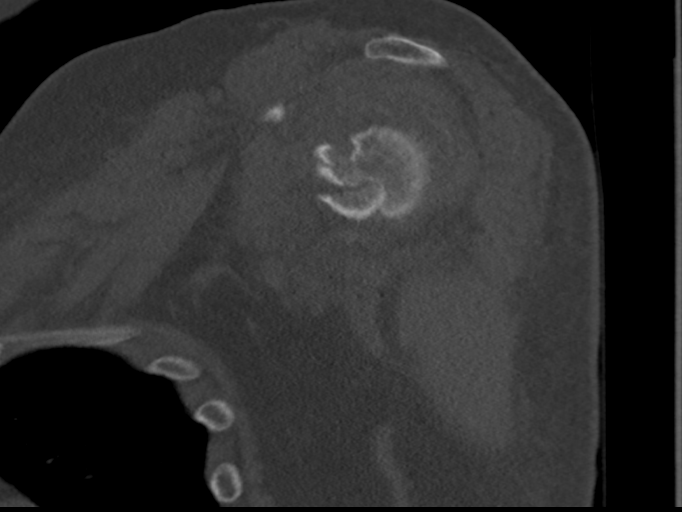
[im 67/101  bone]
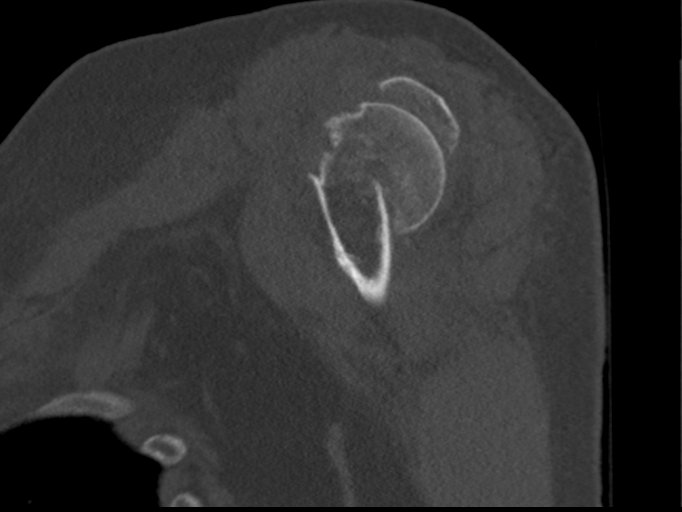

[13 of 33 positions shown; findings below may reference images not displayed]

FINDINGS: Bones/Joint/Cartilage

Comminuted fracture of the surgical neck of the left proximal
humerus with 9 mm of anterior displacement and dorsal rotation of
the humeral head. Comminuted fracture of the left greater tuberosity
with 2.4 cm of superior posterolateral displacement. Small fracture
fragment along the anterior margin of the glenoid.

No other acute fracture or dislocation. Small glenohumeral joint
effusion.

Mild arthropathy of the acromioclavicular joint.

Partially visualized cervical spine spondylosis.

Ligaments

Ligaments are suboptimally evaluated by CT.

Muscles and Tendons
Muscles are normal. No muscle atrophy. No intramuscular fluid
collection or hematoma.

Soft tissue
No fluid collection or hematoma.  No soft tissue mass.
IMPRESSION: 1. Comminuted fracture of the surgical neck of the left proximal
humerus with 9 mm of anterior displacement and dorsal rotation of
the humeral head. Comminuted fracture of the left greater tuberosity
with 2.4 cm of superior posterolateral displacement. Small fracture
fragment along the anterior margin of the glenoid.

## 2023-07-18 IMAGING — DX DG SHOULDER 2+V*L*
3 series · 3 of 3 positions shown · non-contrast
Comparison: None.

CLINICAL DATA: Status post fall.

EXAM:
LEFT SHOULDER - 2+ VIEW

[shoulder grashey]
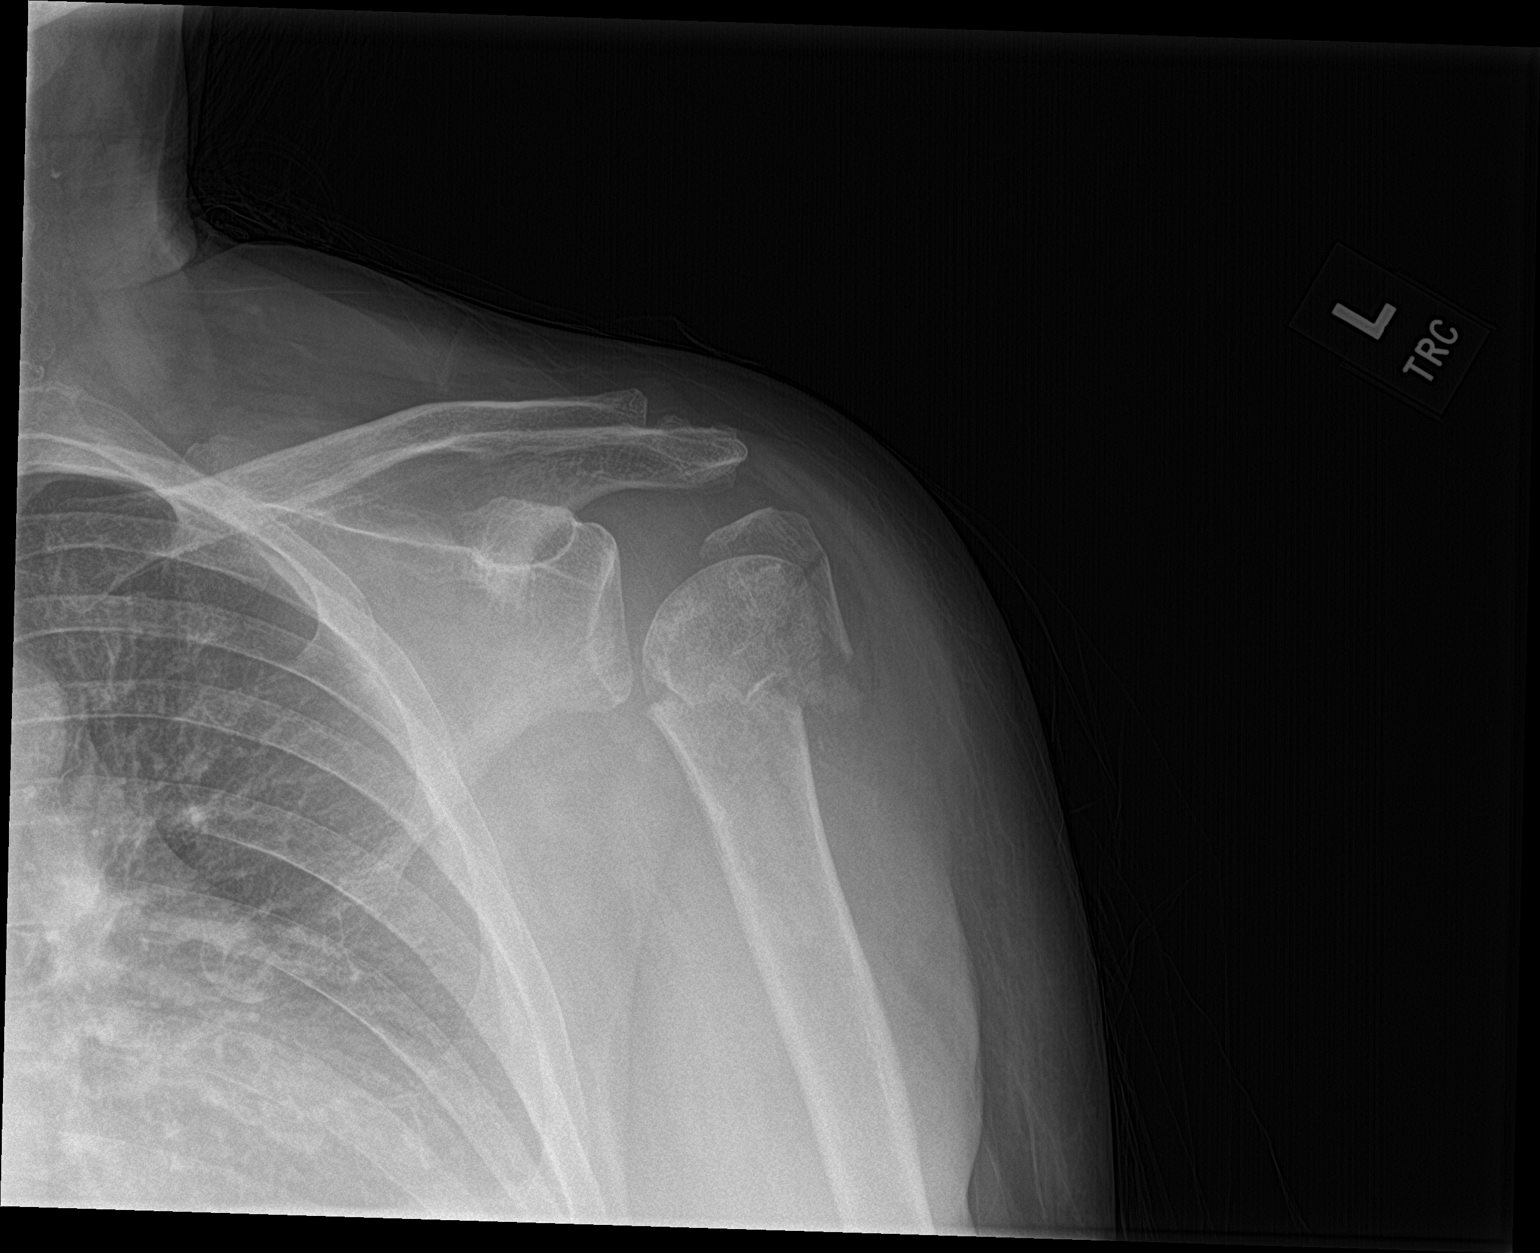

[shoulder y view]
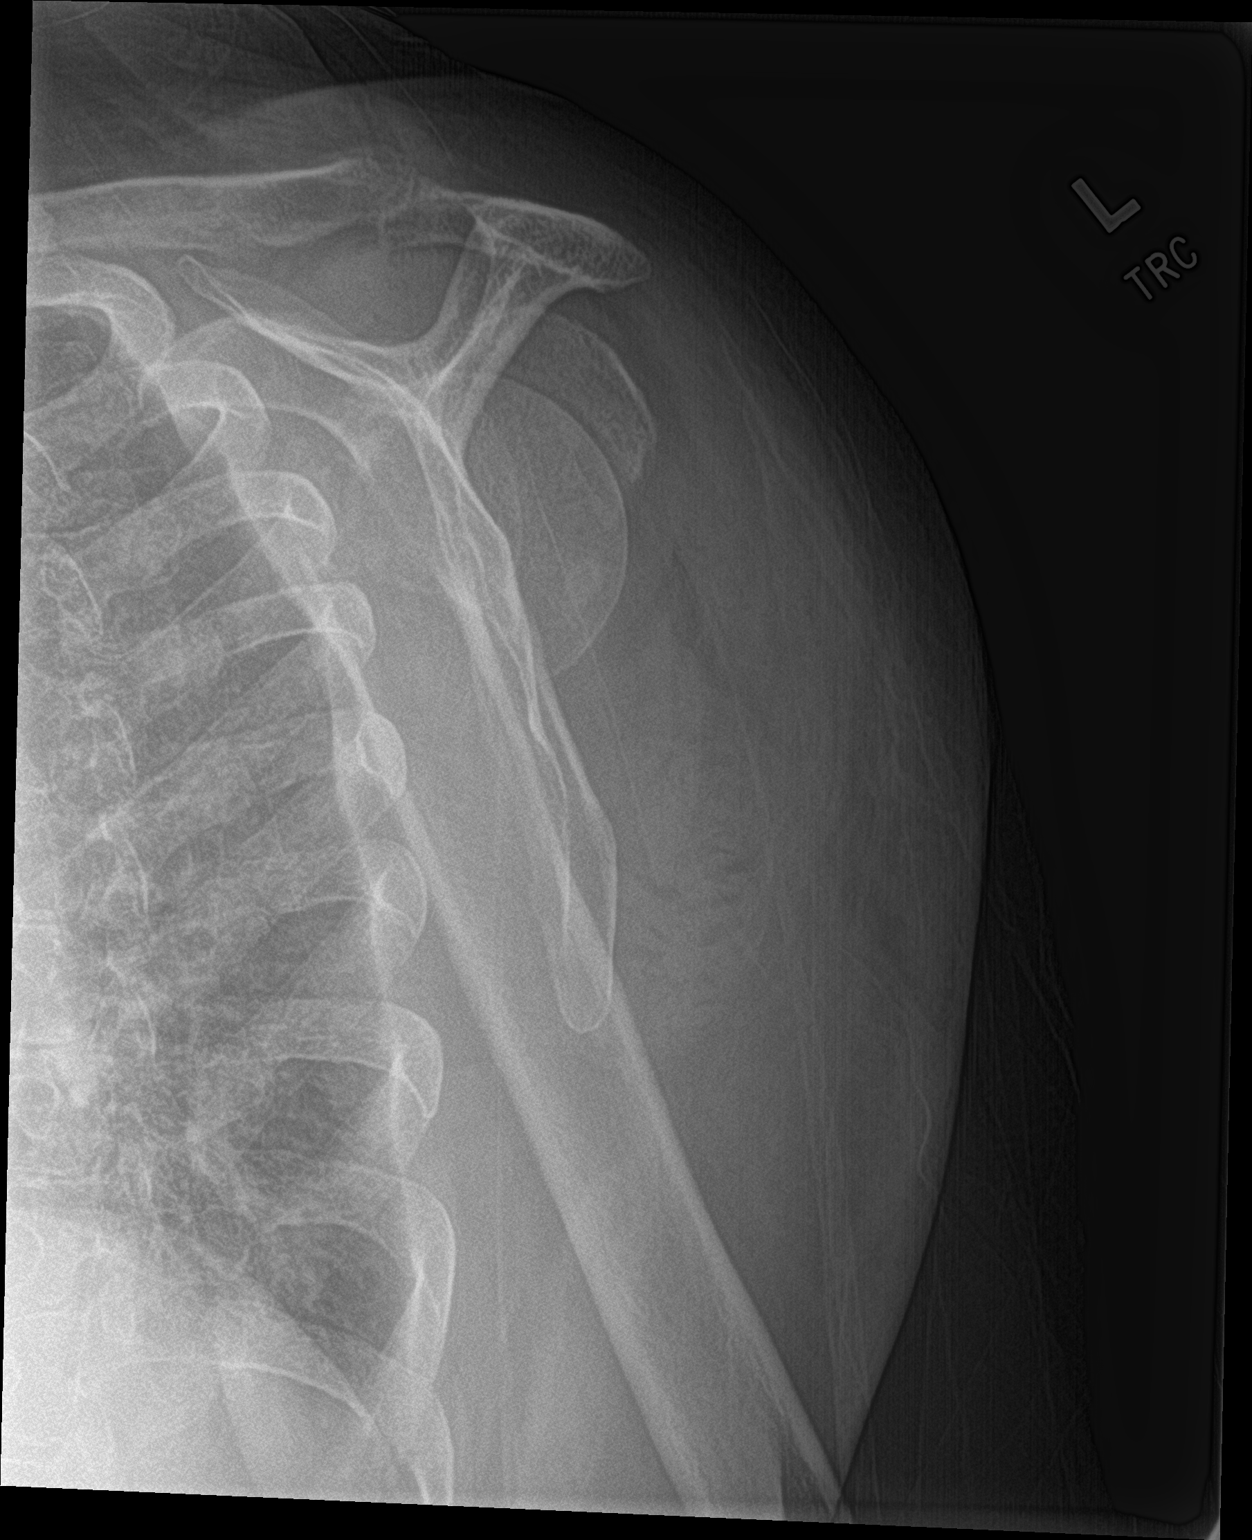

[shoulder ap neutral]
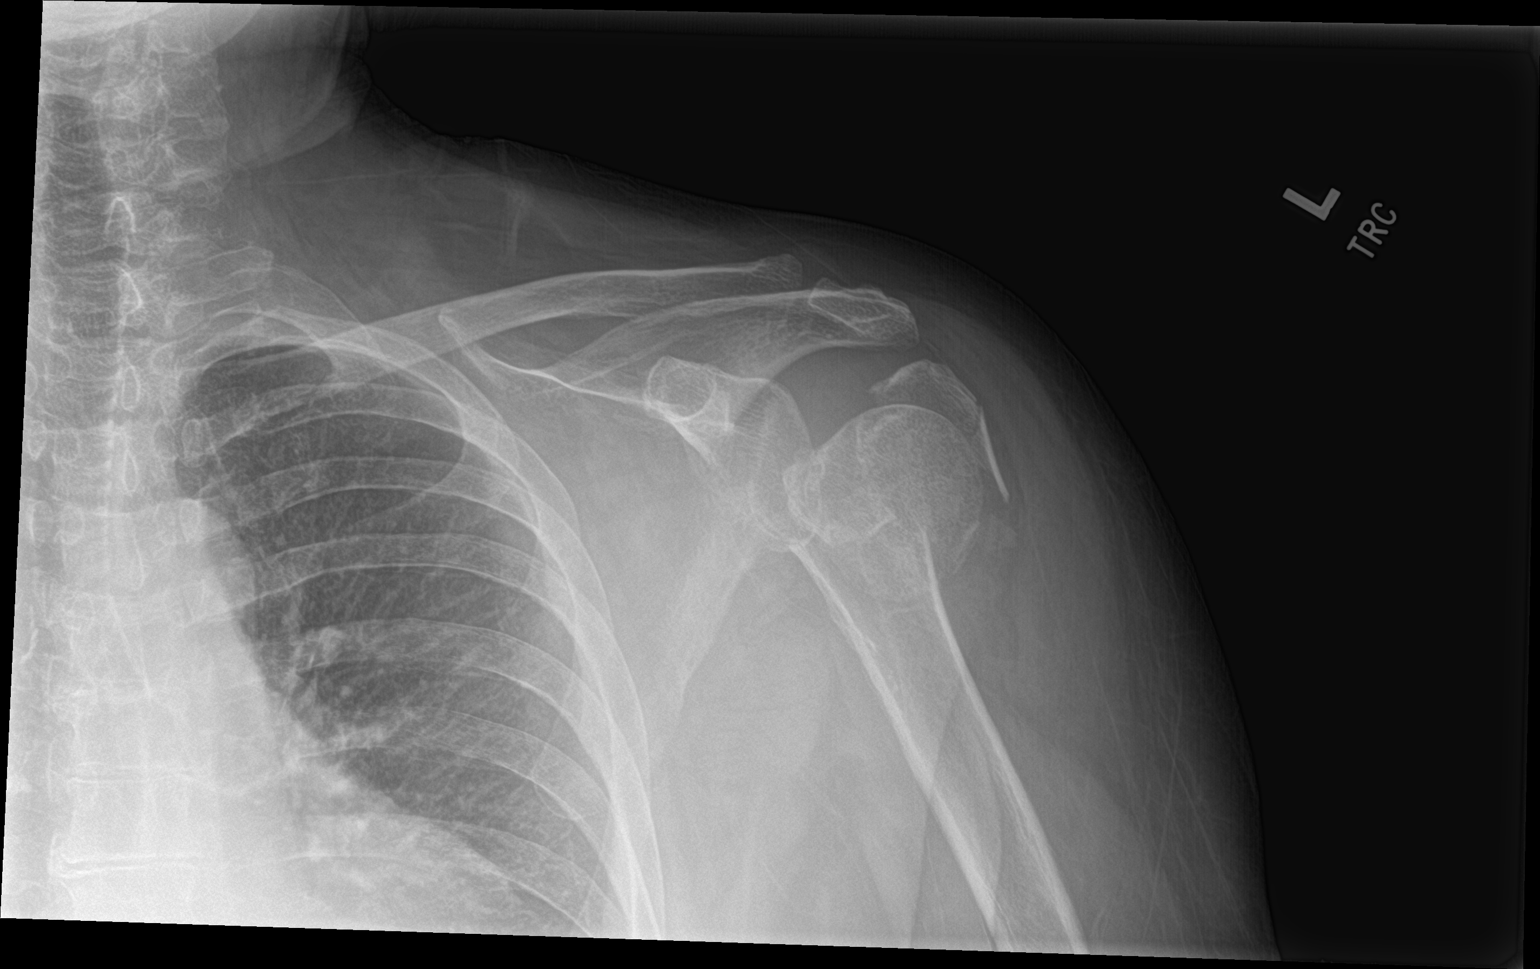

[3 of 3 positions shown; findings below may reference images not displayed]

FINDINGS: Comminuted fracture deformity involving the surgical neck and head
of humerus. Posterior and medial displacement of the distal fracture
fragments noted.
IMPRESSION: Comminuted fracture deformity involves the surgical neck and head of
the humerus.

## 2023-08-01 ENCOUNTER — Ambulatory Visit (AMBULATORY_SURGERY_CENTER): Admitting: *Deleted

## 2023-08-01 ENCOUNTER — Telehealth: Payer: Self-pay | Admitting: *Deleted

## 2023-08-01 VITALS — Ht 63.0 in | Wt 163.0 lb

## 2023-08-01 DIAGNOSIS — Z860100 Personal history of colon polyps, unspecified: Secondary | ICD-10-CM

## 2023-08-01 MED ORDER — SUFLAVE 178.7 G PO SOLR: 1.0000 | Freq: Once | ORAL | 0 refills | Status: AC

## 2023-08-01 NOTE — Progress Notes (Signed)
 Pt's name and DOB verified at the beginning of the pre-visit wit 2 identifiers  Pt denies any difficulty with ambulating,sitting, laying down or rolling side to side  Pt has no issues with ambulation   Pt has no issues moving head neck or swallowing  No egg or soy allergy known to patient   No issues known to pt with past sedation with any surgeries or procedures  Pt denies having issues being intubated  No FH of Malignant Hyperthermia  Pt is not on diet pills or shots  Pt is not on home 02   Pt is not on blood thinners   Pt denies issues with constipation   Pt is not on dialysis  Pt denise any abnormal heart rhythms   Pt denies any upcoming cardiac testing  Patient's chart reviewed by Cathlyn Parsons CNRA prior to pre-visit and patient appropriate for the [facility].  Pre-visit completed and red dot placed by patient's name on their procedure day (on provider's schedule).    Visit by phone  Pt states weight is 163 lb  IInstructions reviewed. Pt given [facility], [facility] main # and MD on call # prior to instructions.  Pt states understanding of instructions. Instructed to review again prior to procedure. Pt states they will.   Informed pt that they will receive a text or  call from [facility] regarding there prep med.

## 2023-08-01 NOTE — Telephone Encounter (Signed)
 Attempt to reach pt  unsuccessfur using on ly # listed and it states Temporary. Reached pt through son's #.

## 2023-08-03 ENCOUNTER — Telehealth: Payer: Self-pay | Admitting: Internal Medicine
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
## 2023-08-17 ENCOUNTER — Encounter: Payer: Self-pay | Admitting: Gastroenterology

## 2023-08-23 ENCOUNTER — Encounter: Payer: Self-pay | Admitting: Gastroenterology

## 2023-08-23 ENCOUNTER — Ambulatory Visit: Admitting: Gastroenterology

## 2023-08-23 VITALS — BP 127/84 | HR 55 | Temp 97.5°F | Ht 63.0 in | Wt 163.0 lb

## 2023-08-23 DIAGNOSIS — Z860100 Personal history of colon polyps, unspecified: Secondary | ICD-10-CM

## 2023-08-23 MED ORDER — SODIUM CHLORIDE 0.9 % IV SOLN: 500.0000 mL | INTRAVENOUS | Status: DC

## 2023-08-23 NOTE — Progress Notes (Signed)
 Pt's states no medical or surgical changes since previsit or office visit.   Patient reports her last bowel movement was not clear and "stuck to the toilet". Dr. Karene Oto made aware and patient decided to reschedule appointment.

## 2023-08-23 NOTE — Progress Notes (Signed)
 This patient was scheduled for colonoscopy with me today.  History of a large sigmoid TVA back in 2022, and was referred to the surgeon at that time for resection.  Despite efforts by the Colorectal Surgery clinic and this GI clinic to coordinate her care, she has had several no-shows and lost to follow-up, which has significantly impacted her care.  She presents today for colonoscopy, but reports still having solid stool.  By her report, completed prep as prescribed, just not completing the last few ounces, but still with what sounds to be solid stool.  Very unlikely to have complete colonoscopy today given description of stool.  I offered that she can go home, stay on clears, complete additional bowel prep this evening with colonoscopy tomorrow with one of my partners.  She elected to cancel completely and instead schedule for next month with me.  I will coordinate getting her into the office in the interim.  I certainly have concerns that the last time she had a colonoscopy 2022 and we had referred for surgical resection, but due to repeated no-shows and noncompliance with follow-up, she has had significant delays in care.  Will need to discuss extended 2-day bowel preparation, possibly placing on standing dose MiraLAX, and attempt to facilitate adequate prep and completion of colonoscopy.  Harry Lindau, DO, [facility]

## 2023-08-24 ENCOUNTER — Telehealth: Payer: Self-pay

## 2023-08-24 NOTE — Telephone Encounter (Signed)
-----   Message from Annis Kinder sent at 08/23/2023  8:44 AM EDT ----- This patient was scheduled for colonoscopy with me today.  History of a large sigmoid TVA back in 2022, and was referred to the surgeon at that time for resection.  She has had several no-shows and lost to follow-up for period time, then presents today for colonoscopy.  Prep reportedly completed by patient but maybe not able to tolerate all of it, and still with solid stool, so procedure was canceled (never attempted).  We offered an appointment for tomorrow, but she elected to reschedule for next month with me.  Given time since her last colonoscopy and concerns about progression of a aggressive polyp, please schedule her an expedited appointment with me or one of the APP's in the interim to discuss and get a full history.  We may need to do repeat CEA, CT, etc. all depending on how she is doing.  It is possible that she is having difficulty tolerating prep due to the size of the polypoid lesion.  Will need extended 2-day prep at the least.  Thanks.

## 2023-08-24 NOTE — Telephone Encounter (Signed)
 Spoke with patient and made her aware that she is scheduled to follow up with Santina Cull, PA-C prior tp colonoscopy.  Patient aware that she is scheduled on 08-29-23 at 9:20am.  Patient agreed to plan and verbalized understanding.  No further questions.

## 2023-08-25 ENCOUNTER — Ambulatory Visit: Payer: Medicare Other | Admitting: Internal Medicine

## 2023-08-29 ENCOUNTER — Encounter: Payer: Self-pay | Admitting: Physician Assistant

## 2023-08-29 ENCOUNTER — Ambulatory Visit: Admitting: Physician Assistant

## 2023-08-29 ENCOUNTER — Other Ambulatory Visit (INDEPENDENT_AMBULATORY_CARE_PROVIDER_SITE_OTHER)

## 2023-08-29 VITALS — BP 122/62 | HR 61 | Ht 63.0 in | Wt 169.0 lb

## 2023-08-29 DIAGNOSIS — Z8719 Personal history of other diseases of the digestive system: Secondary | ICD-10-CM

## 2023-08-29 DIAGNOSIS — K6389 Other specified diseases of intestine: Secondary | ICD-10-CM

## 2023-08-29 DIAGNOSIS — K852 Alcohol induced acute pancreatitis without necrosis or infection: Secondary | ICD-10-CM

## 2023-08-29 DIAGNOSIS — K219 Gastro-esophageal reflux disease without esophagitis: Secondary | ICD-10-CM

## 2023-08-29 DIAGNOSIS — K760 Fatty (change of) liver, not elsewhere classified: Secondary | ICD-10-CM | POA: Diagnosis not present

## 2023-08-29 DIAGNOSIS — D379 Neoplasm of uncertain behavior of digestive organ, unspecified: Secondary | ICD-10-CM | POA: Diagnosis not present

## 2023-08-29 DIAGNOSIS — F1011 Alcohol abuse, in remission: Secondary | ICD-10-CM

## 2023-08-29 DIAGNOSIS — D649 Anemia, unspecified: Secondary | ICD-10-CM

## 2023-08-29 DIAGNOSIS — Z860101 Personal history of adenomatous and serrated colon polyps: Secondary | ICD-10-CM | POA: Diagnosis not present

## 2023-08-29 DIAGNOSIS — D378 Neoplasm of uncertain behavior of other specified digestive organs: Secondary | ICD-10-CM | POA: Diagnosis not present

## 2023-08-29 LAB — HEPATIC FUNCTION PANEL
ALT: 15 U/L (ref 0–35)
AST: 18 U/L (ref 0–37)
Albumin: 4.1 g/dL (ref 3.5–5.2)
Alkaline Phosphatase: 105 U/L (ref 39–117)
Bilirubin, Direct: 0.1 mg/dL (ref 0.0–0.3)
Total Bilirubin: 0.4 mg/dL (ref 0.2–1.2)
Total Protein: 7.2 g/dL (ref 6.0–8.3)

## 2023-08-29 LAB — CBC WITH DIFFERENTIAL/PLATELET
Basophils Absolute: 0 10*3/uL (ref 0.0–0.1)
Basophils Relative: 0.9 % (ref 0.0–3.0)
Eosinophils Absolute: 0.1 10*3/uL (ref 0.0–0.7)
Eosinophils Relative: 2 % (ref 0.0–5.0)
HCT: 35.8 % — ABNORMAL LOW (ref 36.0–46.0)
Hemoglobin: 11.3 g/dL — ABNORMAL LOW (ref 12.0–15.0)
Lymphocytes Relative: 19 % (ref 12.0–46.0)
Lymphs Abs: 1 10*3/uL (ref 0.7–4.0)
MCHC: 31.6 g/dL (ref 30.0–36.0)
MCV: 70 fl — ABNORMAL LOW (ref 78.0–100.0)
Monocytes Absolute: 0.3 10*3/uL (ref 0.1–1.0)
Monocytes Relative: 6.5 % (ref 3.0–12.0)
Neutro Abs: 3.7 10*3/uL (ref 1.4–7.7)
Neutrophils Relative %: 71.6 % (ref 43.0–77.0)
Platelets: 180 10*3/uL (ref 150.0–400.0)
RBC: 5.11 Mil/uL (ref 3.87–5.11)
RDW: 20.4 % — ABNORMAL HIGH (ref 11.5–15.5)
WBC: 5.2 10*3/uL (ref 4.0–10.5)

## 2023-08-29 LAB — BASIC METABOLIC PANEL WITH GFR
BUN: 12 mg/dL (ref 6–23)
CO2: 26 meq/L (ref 19–32)
Calcium: 9.1 mg/dL (ref 8.4–10.5)
Chloride: 105 meq/L (ref 96–112)
Creatinine, Ser: 1 mg/dL (ref 0.40–1.20)
GFR: 57.21 mL/min — ABNORMAL LOW (ref >60.00)
Glucose, Bld: 83 mg/dL (ref 70–99)
Potassium: 4.3 meq/L (ref 3.5–5.1)
Sodium: 140 meq/L (ref 135–145)

## 2023-08-29 MED ORDER — ONDANSETRON HCL 4 MG PO TABS: 4.0000 mg | ORAL_TABLET | Freq: Three times a day (TID) | ORAL | 0 refills | Status: DC | PRN

## 2023-08-29 NOTE — Patient Instructions (Addendum)
 Your provider has requested that you go to the basement level for lab work before leaving today. Press "B" on the elevator. The lab is located at the first door on the left as you exit the elevator.  You have been scheduled for a colonoscopy. Please follow written instructions given to you at your visit today.   If you use inhalers (even only as needed), please bring them with you on the day of your procedure.  DO NOT TAKE 7 DAYS PRIOR TO TEST- Trulicity (dulaglutide) Ozempic, Wegovy (semaglutide) Mounjaro (tirzepatide) Bydureon Bcise (exanatide extended release)  DO NOT TAKE 1 DAY PRIOR TO YOUR TEST Rybelsus (semaglutide) Adlyxin (lixisenatide) Victoza (liraglutide) Byetta (exanatide)  Billing phone number [phone] If your blood pressure at your visit was 140/90 or greater, please contact your primary care physician to follow up on this.  _______________________________________________________  If you are age 25 or older, your body mass index should be between 23-30. Your Body mass index is 29.94 kg/m. If this is out of the aforementioned range listed, please consider follow up with your Primary Care Provider.  If you are age 70 or younger, your body mass index should be between 19-25. Your Body mass index is 29.94 kg/m. If this is out of the aformentioned range listed, please consider follow up with your Primary Care Provider.   ________________________________________________________  The [facility] providers would like to encourage you to use MYCHART to communicate with providers for non-urgent requests or questions.  Due to long hold times on the telephone, sending your provider a message by [facility] may be a faster and more efficient way to get a response.  Please allow 48 business hours for a response.  Please remember that this is for non-urgent requests.  _______________________________________________________   Thank you for entrusting me with your care and choosing  [facility].  Santina Cull PA-C

## 2023-08-29 NOTE — Progress Notes (Signed)
 08/29/2023 Debra Jackson [id number] November 16, 1955  Referring provider: Lawrance Presume, MD Primary GI doctor: Dr. 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 Camilo Cella colorectal surgeon recommends repeating biopsies Had colonoscopy 08/24/2023 however poor prep with solid stool in the colon, procedure canceled. 10/01/2021 iron 79, ferritin 54 BM daily, occ loose or hard stools, has not been passing gas regularly, no weight loss, no night sweats, no hematochezia Adopted, unknown family history Will get labs, CEA, declines CT at this time, ER precautions discussed about obstruction 2-day prep colonoscopy with miralax  We have discussed the risks of bleeding, infection, perforation, medication reactions, and remote risk of death associated with colonoscopy. All questions were answered and the patient acknowledges these risk and wishes to proceed.  GERD 03/14/2021 EGD for coffee-ground emesis inpatient medium size hiatal hernia, torturous esophagus no Cameron erosions mild gastritis Path benign some GERD, no N/V, no dyplasia, no melena Avoid ETOH, NSAIDS, given information Add on omeprazole 20 mg OTC  History of alcoholic pancreatitis admitted 02/2021 02/2021 CT abdomen/pelvis severe fatty infiltration liver without duct dilation mild pancreatitis without PD dilation moderate size hiatal hernia 5.4 x 4 cm soft tissue density sigmoid without obstruction Has not had any ETOH in 1 year  History of alcoholic hepatitis/severe fatty liver CT 02/2021 severe fatty infiltration of liver no duct dilation RUQ US  03/13/2021 cholelithiasis without cholecystitis hepatic steatosis 08/20/2021 alk phos 147, AST 63,  ALT 23, total bilirubin 0.4 Declines imaging, recheck LFTs/CBC Consider elastography Continue to avoid ETOH  Polysubstance abuse/alcohol use disorder No ETOH, no substance abuse other than THC gummies Stop gummies 1 week before colon, avoid controlled substances  Patient Care Team: Lawrance Presume, MD as PCP - General (Internal Medicine)  HISTORY OF PRESENT ILLNESS: 68 y.o. female with a past medical history listed below presents for evaluation of colonoscopy with history of sigmoid mass.   Discussed the use of AI scribe software for clinical note transcription with the patient, who gave verbal consent to proceed.  History of Present Illness   Debra Jackson is a 68 year old female who presents for follow-up on a partially obstructing colon mass.  In November 2022, she was hospitalized for alcoholic pancreatitis and hepatitis, during which a CT scan revealed a soft tissue mass. A subsequent colonoscopy identified a partially obstructing tumor, along with multiple polyps measuring 15mm, 12mm, and 18mm. She did not undergo surgery due to life circumstances and the COVID-19 pandemic.  She has experienced some weight loss over the last 2 years, about 20 lbs and consumes a lot of fruit, particularly watermelon. She reports no current alcohol use and maintains her health with vitamins and occasional THC gummies for sleep. She occasionally uses a vape, with one lasting six months. Her bowel movements are typically daily, sometimes loose, but not hard or constipated. She experienced nausea and vomiting with the colonoscopy prep, which she attributes to the prep solution. She tolerates Miralax prep.  She has experienced recent heartburn, which she attributes to consuming too much sugar, and has used baking soda for relief. No trouble swallowing, night sweats, or significant abdominal distention. One episode of dark stool after coke but no melena, no nausea, vomiting.  She is adopted and has no  known family  history of colon cancer. She has been experiencing some depression but is working on improving her health and exercise routine.      Wt Readings from Last 10 Encounters:  08/29/23 169 lb (76.7 kg)  08/23/23 163 lb (73.9 kg)  08/01/23 163 lb (73.9 kg)  06/23/23 166 lb (75.3 kg)  10/01/21 188 lb 9.6 oz (85.5 kg)  09/23/21 185 lb 8 oz (84.1 kg)  08/20/21 187 lb (84.8 kg)  08/10/21 180 lb (81.6 kg)  08/07/21 185 lb 12.8 oz (84.3 kg)  05/12/21 184 lb 3.2 oz (83.6 kg)     She  reports that she has quit smoking. Her smoking use included cigarettes. She has never used smokeless tobacco. She reports that she does not currently use alcohol. She reports that she does not currently use drugs after having used the following drugs: Cocaine.  RELEVANT GI HISTORY, IMAGING AND LABS: Results   LABS Liver function: Normal (07/2023)  RADIOLOGY CT abdomen: Pancreatitis, hepatitis, soft tissue mass (02/2021)  DIAGNOSTIC Colonoscopy: 15 mm polyp, 12 mm polyp, partially obstructing tumor, 18 mm polyp, multiple other polyps (02/2021)      CBC    Component Value Date/Time   WBC 7.3 10/01/2021 1212   WBC 8.4 08/07/2021 1409   RBC 4.13 10/01/2021 1212   RBC 3.95 08/07/2021 1409   HGB 11.5 10/01/2021 1212   HCT 35.9 10/01/2021 1212   PLT 225 10/01/2021 1212   MCV 87 10/01/2021 1212   MCH 27.8 10/01/2021 1212   MCH 29.1 08/07/2021 1409   MCHC 32.0 10/01/2021 1212   MCHC 31.8 08/07/2021 1409   RDW 18.5 (H) 10/01/2021 1212   LYMPHSABS 0.8 03/18/2021 0205   MONOABS 0.7 03/18/2021 0205   EOSABS 0.2 03/18/2021 0205   BASOSABS 0.0 03/18/2021 0205   No results for input(s): "HGB" in the last 8760 hours.  CMP     Component Value Date/Time   NA 143 08/20/2021 1151   K 3.6 08/20/2021 1151   CL 101 08/20/2021 1151   CO2 27 08/20/2021 1151   GLUCOSE 103 (H) 08/20/2021 1151   GLUCOSE 119 (H) 08/07/2021 1409   BUN 8 08/20/2021 1151   CREATININE 0.77 08/20/2021 1151   CALCIUM 8.7  08/20/2021 1151   PROT 7.0 08/20/2021 1151   ALBUMIN 3.8 08/20/2021 1151   AST 63 (H) 08/20/2021 1151   ALT 23 08/20/2021 1151   ALKPHOS 147 (H) 08/20/2021 1151   BILITOT 0.4 08/20/2021 1151   GFRNONAA >60 08/07/2021 1409   GFRAA >60 01/28/2020 1640      Latest Ref Rng & Units 08/20/2021   11:51 AM 08/07/2021    2:09 PM 05/12/2021    3:51 PM  Hepatic Function  Total Protein 6.0 - 8.5 g/dL 7.0  7.6  7.3   Albumin 3.8 - 4.8 g/dL 3.8  3.6  3.8   AST 0 - 40 IU/L 63  79  44   ALT 0 - 32 IU/L 23  34  18   Alk Phosphatase 44 - 121 IU/L 147  99  148   Total Bilirubin 0.0 - 1.2 mg/dL 0.4  0.9  0.3       Current Medications:      Current Outpatient Medications (Analgesics):    oxycodone  (OXY-IR) 5 MG capsule, Take 5 mg by mouth every 4 (four) hours as needed. (Patient not taking: Reported on 08/29/2023)   Current Outpatient Medications (Other):    Magnesium  250 MG CAPS, Take by mouth.   Multiple Vitamins-Minerals (  MULTIVITAMIN WITH MINERALS) tablet, Take 1 tablet by mouth daily.   ondansetron  (ZOFRAN ) 4 MG tablet, Take 1 tablet (4 mg total) by mouth every 8 (eight) hours as needed for nausea or vomiting.   OVER THE COUNTER MEDICATION, Vit D zinc copper   Turmeric (QC TUMERIC COMPLEX PO), Take by mouth.   BioGaia Probiotic (BIOGAIA/GERBER SOOTHE) LIQD, Take 5 drops by mouth daily at 8 pm. (Patient not taking: Reported on 08/29/2023)   citalopram  (CELEXA ) 20 MG tablet, TAKE 1 TABLET(20 MG) BY MOUTH AT BEDTIME (Patient not taking: Reported on 08/29/2023)   hydrOXYzine  (VISTARIL ) 25 MG capsule, Take 1 capsule (25 mg total) by mouth daily as needed for anxiety. (Patient not taking: Reported on 08/29/2023)   methocarbamol  (ROBAXIN ) 500 MG tablet, Take 1 tablet (500 mg total) by mouth every 6 (six) hours as needed for muscle spasms. (Patient not taking: Reported on 08/29/2023)   Sod Picosulfate-Mag Ox-Cit Acd (CLENPIQ ) 10-3.5-12 MG-GM -GM/160ML SOLN, Take 1 kit by mouth as directed. (Patient not  taking: Reported on 08/29/2023)  Medical History:  Past Medical History:  Diagnosis Date   ADHD    Anxiety    Colonic mass    Depression    Diarrhea    GERD (gastroesophageal reflux disease)    Opiate abuse, continuous (HCC)    Pancreatitis, acute    Polysubstance abuse (HCC)    Substance abuse (HCC)    Allergies:  Allergies  Allergen Reactions   Penicillins Nausea And Vomiting     Surgical History:  She  has a past surgical history that includes Tonsillectomy; Esophagogastroduodenoscopy (egd) with propofol  (N/A, 03/14/2021); biopsy (03/14/2021); and Reverse shoulder arthroplasty (Left, 08/10/2021). Family History:  Her family history is not on file. She was adopted.  REVIEW OF SYSTEMS  : All other systems reviewed and negative except where noted in the History of Present Illness.  PHYSICAL EXAM: BP 122/62   Pulse 61   Ht 5\' 3"  (1.6 m)   Wt 169 lb (76.7 kg)   BMI 29.94 kg/m  Physical Exam   GENERAL APPEARANCE: Well nourished, in no apparent distress. HEENT: No cervical lymphadenopathy, unremarkable thyroid, sclerae anicteric, conjunctiva pink. RESPIRATORY: Respiratory effort normal, breath sounds equal bilaterally without rales, rhonchi, or wheezing. CARDIO: Regular rhythm with a systolic murmur, peripheral pulses intact. ABDOMEN: Soft, non-distended, increased bowel sounds in the left upper quadrant, decreased in the lower abdomen, no tenderness to palpation, no rebound, no mass appreciated. RECTAL: Declines. MUSCULOSKELETAL: Full range of motion, normal gait, without edema. SKIN: Dry, intact without rashes or lesions. No jaundice. NEURO: Alert, oriented, no focal deficits. PSYCH: Cooperative, normal mood and affect.      Edmonia Gottron, PA-C 10:23 AM

## 2023-08-30 ENCOUNTER — Ambulatory Visit (INDEPENDENT_AMBULATORY_CARE_PROVIDER_SITE_OTHER)

## 2023-08-30 ENCOUNTER — Other Ambulatory Visit: Payer: Self-pay

## 2023-08-30 DIAGNOSIS — D649 Anemia, unspecified: Secondary | ICD-10-CM

## 2023-08-30 LAB — IBC + FERRITIN
Ferritin: 5.1 ng/mL — ABNORMAL LOW (ref 10.0–291.0)
Iron: 30 ug/dL — ABNORMAL LOW (ref 42–145)
Saturation Ratios: 5.3 % — ABNORMAL LOW (ref 20.0–50.0)
TIBC: 567 ug/dL — ABNORMAL HIGH (ref 250.0–450.0)
Transferrin: 405 mg/dL — ABNORMAL HIGH (ref 212.0–360.0)

## 2023-08-30 LAB — CEA: CEA: <2 ng/mL

## 2023-08-30 MED ORDER — IRON 325 (65 FE) MG PO TABS: 325.0000 mg | ORAL_TABLET | Freq: Every day | ORAL | 1 refills | Status: AC

## 2023-08-30 NOTE — Addendum Note (Signed)
 Addended by: Santina Cull on: 08/30/2023 09:04 AM   Modules accepted: Orders

## 2023-09-03 NOTE — Progress Notes (Signed)
 Agree with the assessment and plan as outlined by Santina Cull, PA-C.  Agree with plan for extended 2-day bowel preparation given previous poor prep.  Will discuss with patient about repeat CT at the time of her colonoscopy and pending colonoscopy findings.  Vicki Pasqual, DO, [facility]

## 2023-09-22 ENCOUNTER — Encounter: Payer: Self-pay | Admitting: Gastroenterology

## 2023-09-22 ENCOUNTER — Ambulatory Visit: Admitting: Gastroenterology

## 2023-09-22 VITALS — BP 125/44 | HR 78 | Temp 98.1°F | Resp 10 | Ht 63.0 in | Wt 163.0 lb

## 2023-09-22 DIAGNOSIS — D125 Benign neoplasm of sigmoid colon: Secondary | ICD-10-CM | POA: Diagnosis not present

## 2023-09-22 DIAGNOSIS — Z1211 Encounter for screening for malignant neoplasm of colon: Secondary | ICD-10-CM | POA: Diagnosis not present

## 2023-09-22 DIAGNOSIS — D509 Iron deficiency anemia, unspecified: Secondary | ICD-10-CM | POA: Diagnosis not present

## 2023-09-22 DIAGNOSIS — K5730 Diverticulosis of large intestine without perforation or abscess without bleeding: Secondary | ICD-10-CM

## 2023-09-22 DIAGNOSIS — D500 Iron deficiency anemia secondary to blood loss (chronic): Secondary | ICD-10-CM | POA: Diagnosis not present

## 2023-09-22 DIAGNOSIS — K641 Second degree hemorrhoids: Secondary | ICD-10-CM | POA: Diagnosis not present

## 2023-09-22 DIAGNOSIS — Z860101 Personal history of adenomatous and serrated colon polyps: Secondary | ICD-10-CM

## 2023-09-22 DIAGNOSIS — K562 Volvulus: Secondary | ICD-10-CM

## 2023-09-22 DIAGNOSIS — D128 Benign neoplasm of rectum: Secondary | ICD-10-CM

## 2023-09-22 DIAGNOSIS — K56600 Partial intestinal obstruction, unspecified as to cause: Secondary | ICD-10-CM

## 2023-09-22 DIAGNOSIS — K6389 Other specified diseases of intestine: Secondary | ICD-10-CM

## 2023-09-22 MED ORDER — SODIUM CHLORIDE 0.9 % IV SOLN
500.0000 mL | INTRAVENOUS | Status: DC
Start: 2023-09-22 — End: 2023-09-22

## 2023-09-22 NOTE — Op Note (Signed)
  [facility] Patient Name: Debra Jackson Procedure Date: 09/22/2023 2:09 PM MRN: [id number] Endoscopist: Harry Lindau , MD, [id number] Age: 68 Referring MD:  Date of Birth: 08/23/1955 Gender: Female Account #: [id number] Procedure:                Colonoscopy Indications:              High risk colon cancer surveillance: Personal                            history of colonic polyps, , Incidental - Iron                             deficiency anemia, Incidental - Abnormal CT of the                            GI tract                           Last complete colonoscopy was 09/17/2020 and notable                            for 15 mm ascending colon adenoma, 12 mm descending                            colon pedunculated adenoma, 18 mm pedunculated                            sigmoid adenoma, villous partially obstructing                            sigmoid tumor measuring 4 cm in length located                            24-28 cm from the anal verge. This was biopsied                            (path: Tubulovillous adenoma and tattoo placed 1 cm                            distal to the lesion. 2 additional small rectal                            hyperplastic polyps and small internal hemorrhoids                            also noted. CEA was normal. She was referred to                            Colorectal Surgery and offered resection, but                            patient ultimately did not go through with any  surgery and presents today for follow-up and                            surveillance. Last CT was in 02/2021 with 5.4 x 4                            centimeter soft tissue density in the sigmoid colon                            without obstruction. Medicines:                Monitored Anesthesia Care Procedure:                Pre-Anesthesia Assessment:                           - Prior to the procedure, a History and Physical                             was performed, and patient medications and                            allergies were reviewed. The patient's tolerance of                            previous anesthesia was also reviewed. The risks                            and benefits of the procedure and the sedation                            options and risks were discussed with the patient.                            All questions were answered, and informed consent                            was obtained. Prior Anticoagulants: The patient has                            taken no anticoagulant or antiplatelet agents. ASA                            Grade Assessment: II - A patient with mild systemic                            disease. After reviewing the risks and benefits,                            the patient was deemed in satisfactory condition to                            undergo the procedure.  After obtaining informed consent, the colonoscope                            was passed under direct vision. Throughout the                            procedure, the patient's blood pressure, pulse, and                            oxygen  saturations were monitored continuously. The                            [id number]-HQ190L Colonoscope [id number] was introduced                            through the anus and advanced to the the cecum,                            identified by appendiceal orifice and ileocecal                            valve. The colonoscopy was technically difficult                            and complex due to significant looping. Successful                            completion of the procedure was aided by                            straightening and shortening the scope to obtain                            bowel loop reduction. The patient tolerated the                            procedure well. The quality of the bowel                            preparation was good. The ileocecal valve,                             appendiceal orifice, and rectum were photographed. Scope In: 2:14:40 PM Scope Out: 2:42:19 PM Scope Withdrawal Time: 0 hours 18 minutes 56 seconds  Total Procedure Duration: 0 hours 27 minutes 39 seconds  Findings:                 Hemorrhoids were found on perianal exam.                           A frond-like/villous partially obstructing large                            mass was found in the sigmoid colon. There was a  previously-placed tattoo immediately distal to the                            mass. The mass was partially circumferential                            (involving one-half of the lumen circumference).                            The mass measured five cm in length. This was                            biopsied with a cold forceps for histology.                            Estimated blood loss was minimal.                           A 10 mm polyp was found in the distal rectum. The                            polyp was sessile. The polyp was removed with a                            cold snare. Resection and retrieval were complete.                            Estimated blood loss was minimal.                           Multiple medium-mouthed and small-mouthed                            diverticula were found in the sigmoid colon.                           Non-bleeding internal hemorrhoids were found during                            retroflexion. The hemorrhoids were small and Grade                            II (internal hemorrhoids that prolapse but reduce                            spontaneously).                           The ascending colon revealed significantly                            excessive looping. Advancing the scope required                            straightening and shortening the  scope to obtain                            bowel loop reduction. Complications:            No immediate complications. Estimated Blood  Loss:     Estimated blood loss was minimal. Impression:               - Hemorrhoids found on perianal exam.                           - Likely malignant partially obstructing tumor in                            the sigmoid colon. Biopsied.                           - One 10 mm polyp in the distal rectum, removed                            with a cold snare. Resected and retrieved.                           - Diverticulosis in the sigmoid colon.                           - Non-bleeding internal hemorrhoids.                           - There was significant looping of the colon. Recommendation:           - Patient has a contact number available for                            emergencies. The signs and symptoms of potential                            delayed complications were discussed with the                            patient. Return to normal activities tomorrow.                            Written discharge instructions were provided to the                            patient.                           - Resume previous diet.                           - Continue present medications.                           - Await pathology results.                           -  Perform a CT scan chest/abdomen/pelvis with                            contrast at the next available appointment.                           - Follow-up with Dr. Camilo Cella in the Colorectal                            Surgery Clinic at the next available appointment. Harry Lindau, MD 09/22/2023 2:52:27 PM

## 2023-09-22 NOTE — Progress Notes (Signed)
 Pt's states no medical or surgical changes since previsit or office visit.
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
## 2023-09-22 NOTE — Progress Notes (Signed)
 Sedate, gd SR, tolerated procedure well, VSS, report to RN

## 2023-09-22 NOTE — Progress Notes (Signed)
 Called to room to assist during endoscopic procedure.  Patient ID and intended procedure confirmed with present staff. Received instructions for my participation in the procedure from the performing physician.

## 2023-09-22 NOTE — Patient Instructions (Addendum)
-  Handout on polyps, hemorrhoids and diverticulosis provided. -await pathology results. -Continue present medications.   YOU HAD AN ENDOSCOPIC PROCEDURE TODAY AT THE [facility]:   Refer to the procedure report that was given to you for any specific questions about what was found during the examination.  If the procedure report does not answer your questions, please call your gastroenterologist to clarify.  If you requested that your care partner not be given the details of your procedure findings, then the procedure report has been included in a sealed envelope for you to review at your convenience later.  YOU SHOULD EXPECT: Some feelings of bloating in the abdomen. Passage of more gas than usual.  Walking can help get rid of the air that was put into your GI tract during the procedure and reduce the bloating. If you had a lower endoscopy (such as a colonoscopy or flexible sigmoidoscopy) you may notice spotting of blood in your stool or on the toilet paper. If you underwent a bowel prep for your procedure, you may not have a normal bowel movement for a few days.  Please Note:  You might notice some irritation and congestion in your nose or some drainage.  This is from the oxygen  used during your procedure.  There is no need for concern and it should clear up in a day or so.  SYMPTOMS TO REPORT IMMEDIATELY:  Following lower endoscopy (colonoscopy or flexible sigmoidoscopy):  Excessive amounts of blood in the stool  Significant tenderness or worsening of abdominal pains  Swelling of the abdomen that is new, acute  Fever of 100F or higher  For urgent or emergent issues, a gastroenterologist can be reached at any hour by calling [phone]. Do not use MyChart messaging for urgent concerns.    DIET:  We do recommend a small meal at first, but then you may proceed to your regular diet.  Drink plenty of fluids but you should avoid alcoholic beverages for 24 hours.  ACTIVITY:  You  should plan to take it easy for the rest of today and you should NOT DRIVE or use heavy machinery until tomorrow (because of the sedation medicines used during the test).    FOLLOW UP: Our staff will call the number listed on your records the next business day following your procedure.  We will call around 7:15- 8:00 am to check on you and address any questions or concerns that you may have regarding the information given to you following your procedure. If we do not reach you, we will leave a message.     If any biopsies were taken you will be contacted by phone or by letter within the next 1-3 weeks.  Please call us  at [phone] if you have not heard about the biopsies in 3 weeks.    SIGNATURES/CONFIDENTIALITY: You and/or your care partner have signed paperwork which will be entered into your electronic medical record.  These signatures attest to the fact that that the information above on your After Visit Summary has been reviewed and is understood.  Full responsibility of the confidentiality of this discharge information lies with you and/or your care-partner.

## 2023-09-23 ENCOUNTER — Other Ambulatory Visit: Payer: Self-pay

## 2023-09-23 ENCOUNTER — Telehealth: Payer: Self-pay

## 2023-09-23 ENCOUNTER — Telehealth: Payer: Self-pay | Admitting: *Deleted

## 2023-09-23 DIAGNOSIS — K6389 Other specified diseases of intestine: Secondary | ICD-10-CM

## 2023-09-23 LAB — SURGICAL PATHOLOGY

## 2023-09-23 NOTE — Telephone Encounter (Signed)
 Patient aware that she is scheduled for CT-chest/abd/pel on 09-25-23 at [facility] at 4pm.  Patient advised to arrive at 1:30pm.  She will drink one bottle of contrast at 2pm and the second bottle at 3pm.  She will be scanned at 4pm.  Patient instructed that she should not have any foods 4 hours prior to scan.    Patient advised that she has been referred to [facility]/Dr Elvan Hamel.  15 pages of records faxed and fax confirmation was received.    Patient agreed to plan and verbalized understanding.  No further questions.

## 2023-09-23 NOTE — Telephone Encounter (Signed)
  Follow up Call-     09/22/2023    1:15 PM 08/23/2023    8:08 AM  Call back number  Post procedure Call Back phone  # [phone]  Permission to leave phone message Yes Yes   Phone number listed above would not dial out. Called husband Debra Jackson. He states pt is still sleeping but as far as he knows she is doing well after colonoscopy. Instructed for pt to call back if she is having any issues after she wakes up today.   Patient questions:  Do you have a fever, pain , or abdominal swelling? No. Pain Score  0 *  Have you tolerated food without any problems? Yes.    Have you been able to return to your normal activities? Yes.    Do you have any questions about your discharge instructions: Diet   No. Medications  No. Follow up visit  No.  Do you have questions or concerns about your Care? No.  Actions: * If pain score is 4 or above: No action needed, pain <4.

## 2023-09-25 ENCOUNTER — Ambulatory Visit (HOSPITAL_BASED_OUTPATIENT_CLINIC_OR_DEPARTMENT_OTHER)
Admission: RE | Admit: 2023-09-25 | Discharge: 2023-09-25 | Disposition: A | Discharge status: Home / Self Care | Source: Ambulatory Visit | Attending: Gastroenterology | Admitting: Gastroenterology

## 2023-09-25 DIAGNOSIS — K639 Disease of intestine, unspecified: Secondary | ICD-10-CM | POA: Insufficient documentation

## 2023-09-25 DIAGNOSIS — I2510 Atherosclerotic heart disease of native coronary artery without angina pectoris: Secondary | ICD-10-CM | POA: Insufficient documentation

## 2023-09-25 DIAGNOSIS — N200 Calculus of kidney: Secondary | ICD-10-CM | POA: Insufficient documentation

## 2023-09-25 DIAGNOSIS — I700 Atherosclerosis of aorta: Secondary | ICD-10-CM | POA: Insufficient documentation

## 2023-09-25 DIAGNOSIS — K6389 Other specified diseases of intestine: Secondary | ICD-10-CM

## 2023-09-25 DIAGNOSIS — K449 Diaphragmatic hernia without obstruction or gangrene: Secondary | ICD-10-CM | POA: Insufficient documentation

## 2023-09-25 MED ORDER — IOHEXOL 300 MG/ML  SOLN
100.0000 mL | Freq: Once | INTRAMUSCULAR | Status: AC | PRN
  Administered 2023-09-25: 100 mL via INTRAVENOUS

## 2023-09-26 ENCOUNTER — Ambulatory Visit: Payer: Self-pay | Admitting: Gastroenterology

## 2023-09-27 DIAGNOSIS — K6389 Other specified diseases of intestine: Secondary | ICD-10-CM | POA: Diagnosis not present

## 2023-10-12 ENCOUNTER — Ambulatory Visit: Payer: Self-pay | Admitting: Surgery

## 2023-10-12 DIAGNOSIS — Z01818 Encounter for other preprocedural examination: Secondary | ICD-10-CM

## 2023-10-20 NOTE — Patient Instructions (Signed)
 SURGICAL WAITING ROOM VISITATION Patients having surgery or a procedure may have no more than 2 support people in the waiting area - these visitors may rotate.    Children under the age of 13 must have an adult with them who is not the patient.  If the patient needs to stay at the hospital during part of their recovery, the visitor guidelines for inpatient rooms apply. Pre-op nurse will coordinate an appropriate time for 1 support person to accompany patient in pre-op.  This support person may not rotate.    Please refer to the [facility] website for the visitor guidelines for Inpatients (after your surgery is over and you are in a regular room).       Your procedure is scheduled on: 10-31-23   Report to [facility] Main Entrance    Report to admitting at 11:15 AM   Call this number if you have problems the morning of surgery [phone]   Follow a clear liquid diet day of prep to prevent dehydration.     After Midnight you may have the following liquids until 10:30 AM DAY OF SURGERY  Water Non-Citrus Juices (without pulp, NO RED-Apple, White grape, White cranberry) Black Coffee (NO MILK/CREAM OR CREAMERS, sugar ok)  Clear Tea (NO MILK/CREAM OR CREAMERS, sugar ok) regular and decaf                             Plain Jell-O (NO RED)                                           Fruit ices (not with fruit pulp, NO RED)                                     Popsicles (NO RED)                                                               Sports drinks like Gatorade (NO RED)  Drink 2 Pre-surgery Ensure the evening before surgery                    The day of surgery:  Drink ONE (1) Pre-Surgery Clear Ensure or G2 by 10:30 AM the morning of surgery. Drink in one sitting. Do not sip.  This drink was given to you during your hospital  pre-op appointment visit. Nothing else to drink after completing the Pre-Surgery Clear Ensure or G2.          If you have questions, please contact  your surgeon's office.   FOLLOW BOWEL PREP AND ANY ADDITIONAL PRE OP INSTRUCTIONS YOU RECEIVED FROM YOUR SURGEON'S OFFICE!!!     Oral Hygiene is also important to reduce your risk of infection.                                    Remember - BRUSH YOUR TEETH THE MORNING OF SURGERY WITH YOUR REGULAR TOOTHPASTE   Do NOT smoke  after Midnight   Take these medicines the morning of surgery with A SIP OF WATER:    None  Stop all vitamins and herbal supplements 7 days before surgery  Bring CPAP mask and tubing day of surgery.                              You may not have any metal on your body including hair pins, jewelry, and body piercing             Do not wear make-up, lotions, powders, perfumes, or deodorant  Do not wear nail polish including gel and S&S, artificial/acrylic nails, or any other type of covering on natural nails including finger and toenails. If you have artificial nails, gel coating, etc. that needs to be removed by a nail salon please have this removed prior to surgery or surgery may need to be canceled/ delayed if the surgeon/ anesthesia feels like they are unable to be safely monitored.   Do not shave  48 hours prior to surgery.    Do not bring valuables to the hospital. [facility] IS NOT RESPONSIBLE   FOR VALUABLES.   Contacts, dentures or bridgework may not be worn into surgery.   Bring small overnight bag day of surgery.   DO NOT BRING YOUR HOME MEDICATIONS TO THE HOSPITAL. PHARMACY WILL DISPENSE MEDICATIONS LISTED ON YOUR MEDICATION LIST TO YOU DURING YOUR ADMISSION IN THE HOSPITAL!   Special Instructions: Bring a copy of your healthcare power of attorney and living will documents the day of surgery if you haven't scanned them before.              Please read over the following fact sheets you were given: IF YOU HAVE QUESTIONS ABOUT YOUR PRE-OP INSTRUCTIONS PLEASE CALL [phone] [facility]  If you received a COVID test during your pre-op visit  it is requested  that you wear a mask when out in public, stay away from anyone that may not be feeling well and notify your surgeon if you develop symptoms. If you test positive for Covid or have been in contact with anyone that has tested positive in the last 10 days please notify you surgeon.  [facility] - Preparing for Surgery Before surgery, you can play an important role.  Because skin is not sterile, your skin needs to be as free of germs as possible.  You can reduce the number of germs on your skin by washing with CHG (chlorahexidine gluconate) soap before surgery.  CHG is an antiseptic cleaner which kills germs and bonds with the skin to continue killing germs even after washing. Please DO NOT use if you have an allergy to CHG or antibacterial soaps.  If your skin becomes reddened/irritated stop using the CHG and inform your nurse when you arrive at Short Stay. Do not shave (including legs and underarms) for at least 48 hours prior to the first CHG shower.  You may shave your face/neck.  Please follow these instructions carefully:  1.  Shower with CHG Soap the night before surgery and the  morning of surgery.  2.  If you choose to wash your hair, wash your hair first as usual with your normal  shampoo.  3.  After you shampoo, rinse your hair and body thoroughly to remove the shampoo.  4.  Use CHG as you would any other liquid soap.  You can apply chg directly to the skin and wash.  Gently with a scrungie or clean washcloth.  5.  Apply the CHG Soap to your body ONLY FROM THE NECK DOWN.   Do   not use on face/ open                           Wound or open sores. Avoid contact with eyes, ears mouth and   genitals (private parts).                       Wash face,  Genitals (private parts) with your normal soap.             6.  Wash thoroughly, paying special attention to the area where your    surgery  will be performed.  7.  Thoroughly rinse your body with warm water from the neck  down.  8.  DO NOT shower/wash with your normal soap after using and rinsing off the CHG Soap.                9.  Pat yourself dry with a clean towel.            10.  Wear clean pajamas.            11.  Place clean sheets on your bed the night of your first shower and do not  sleep with pets. Day of Surgery : Do not apply any lotions/deodorants the morning of surgery.  Please wear clean clothes to the hospital/surgery center.  FAILURE TO FOLLOW THESE INSTRUCTIONS MAY RESULT IN THE CANCELLATION OF YOUR SURGERY  PATIENT SIGNATURE_________________________________  NURSE SIGNATURE__________________________________  ________________________________________________________________________    Nasario Exon  An incentive spirometer is a tool that can help keep your lungs clear and active. This tool measures how well you are filling your lungs with each breath. Taking long deep breaths may help reverse or decrease the chance of developing breathing (pulmonary) problems (especially infection) following: A long period of time when you are unable to move or be active. BEFORE THE PROCEDURE  If the spirometer includes an indicator to show your best effort, your nurse or respiratory therapist will set it to a desired goal. If possible, sit up straight or lean slightly forward. Try not to slouch. Hold the incentive spirometer in an upright position. INSTRUCTIONS FOR USE  Sit on the edge of your bed if possible, or sit up as far as you can in bed or on a chair. Hold the incentive spirometer in an upright position. Breathe out normally. Place the mouthpiece in your mouth and seal your lips tightly around it. Breathe in slowly and as deeply as possible, raising the piston or the ball toward the top of the column. Hold your breath for 3-5 seconds or for as long as possible. Allow the piston or ball to fall to the bottom of the column. Remove the mouthpiece from your mouth and breathe out  normally. Rest for a few seconds and repeat Steps 1 through 7 at least 10 times every 1-2 hours when you are awake. Take your time and take a few normal breaths between deep breaths. The spirometer may include an indicator to show your best effort. Use the indicator as a goal to work toward during each repetition. After each set of 10 deep breaths, practice coughing to be  sure your lungs are clear. If you have an incision (the cut made at the time of surgery), support your incision when coughing by placing a pillow or rolled up towels firmly against it. Once you are able to get out of bed, walk around indoors and cough well. You may stop using the incentive spirometer when instructed by your caregiver.  RISKS AND COMPLICATIONS Take your time so you do not get dizzy or light-headed. If you are in pain, you may need to take or ask for pain medication before doing incentive spirometry. It is harder to take a deep breath if you are having pain. AFTER USE Rest and breathe slowly and easily. It can be helpful to keep track of a log of your progress. Your caregiver can provide you with a simple table to help with this. If you are using the spirometer at home, follow these instructions: SEEK MEDICAL CARE IF:  You are having difficultly using the spirometer. You have trouble using the spirometer as often as instructed. Your pain medication is not giving enough relief while using the spirometer. You develop fever of 100.5 F (38.1 C) or higher. SEEK IMMEDIATE MEDICAL CARE IF:  You cough up bloody sputum that had not been present before. You develop fever of 102 F (38.9 C) or greater. You develop worsening pain at or near the incision site. MAKE SURE YOU:  Understand these instructions. Will watch your condition. Will get help right away if you are not doing well or get worse. Document Released: 08/23/2006 Document Revised: 07/05/2011 Document Reviewed: 10/24/2006 ExitCare Patient Information  2014 Exit[employer], [place].   ________________________________________________________________________ WHAT IS A BLOOD TRANSFUSION? Blood Transfusion Information  A transfusion is the replacement of blood or some of its parts. Blood is made up of multiple cells which provide different functions. Red blood cells carry oxygen  and are used for blood loss replacement. White blood cells fight against infection. Platelets control bleeding. Plasma helps clot blood. Other blood products are available for specialized needs, such as hemophilia or other clotting disorders. BEFORE THE TRANSFUSION  Who gives blood for transfusions?  Healthy volunteers who are fully evaluated to make sure their blood is safe. This is blood bank blood. Transfusion therapy is the safest it has ever been in the practice of medicine. Before blood is taken from a donor, a complete history is taken to make sure that person has no history of diseases nor engages in risky social behavior (examples are intravenous drug use or sexual activity with multiple partners). The donor's travel history is screened to minimize risk of transmitting infections, such as malaria. The donated blood is tested for signs of infectious diseases, such as HIV and hepatitis. The blood is then tested to be sure it is compatible with you in order to minimize the chance of a transfusion reaction. If you or a relative donates blood, this is often done in anticipation of surgery and is not appropriate for emergency situations. It takes many days to process the donated blood. RISKS AND COMPLICATIONS Although transfusion therapy is very safe and saves many lives, the main dangers of transfusion include:  Getting an infectious disease. Developing a transfusion reaction. This is an allergic reaction to something in the blood you were given. Every precaution is taken to prevent this. The decision to have a blood transfusion has been considered carefully by your caregiver  before blood is given. Blood is not given unless the benefits outweigh the risks. AFTER THE TRANSFUSION Right after receiving a blood  transfusion, you will usually feel much better and more energetic. This is especially true if your red blood cells have gotten low (anemic). The transfusion raises the level of the red blood cells which carry oxygen , and this usually causes an energy increase. The nurse administering the transfusion will monitor you carefully for complications. HOME CARE INSTRUCTIONS  No special instructions are needed after a transfusion. You may find your energy is better. Speak with your caregiver about any limitations on activity for underlying diseases you may have. SEEK MEDICAL CARE IF:  Your condition is not improving after your transfusion. You develop redness or irritation at the intravenous (IV) site. SEEK IMMEDIATE MEDICAL CARE IF:  Any of the following symptoms occur over the next 12 hours: Shaking chills. You have a temperature by mouth above 102 F (38.9 C), not controlled by medicine. Chest, back, or muscle pain. People around you feel you are not acting correctly or are confused. Shortness of breath or difficulty breathing. Dizziness and fainting. You get a rash or develop hives. You have a decrease in urine output. Your urine turns a dark color or changes to pink, red, or brown. Any of the following symptoms occur over the next 10 days: You have a temperature by mouth above 102 F (38.9 C), not controlled by medicine. Shortness of breath. Weakness after normal activity. The white part of the eye turns yellow (jaundice). You have a decrease in the amount of urine or are urinating less often. Your urine turns a dark color or changes to pink, red, or brown. Document Released: 04/09/2000 Document Revised: 07/05/2011 Document Reviewed: 11/27/2007 [facility] Patient Information 2014 [place],  [place].  _______________________________________________________________________

## 2023-10-20 NOTE — Progress Notes (Signed)
  Date of COVID positive in last 90 days:  PCP - Barnie Louder, MD Cardiologist -   Chest x-ray -  CT chest 09-25-23 Epic EKG -  Stress Test -  ECHO -  Cardiac Cath -  Pacemaker/ICD device last checked: Spinal Cord Stimulator:  Bowel Prep -   Sleep Study -  CPAP -   Fasting Blood Sugar -  Checks Blood Sugar _____ times a day  Last dose of GLP1 agonist-  N/A GLP1 instructions:  Do not take after     Last dose of SGLT-2 inhibitors-  N/A SGLT-2 instructions:  Do not take after     Blood Thinner Instructions:  Last dose:   Time: Aspirin Instructions: Last Dose:  Activity level:  Can go up a flight of stairs and perform activities of daily living without stopping and without symptoms of chest pain or shortness of breath.  Able to exercise without symptoms  Unable to go up a flight of stairs without symptoms of     Anesthesia review:   Patient denies shortness of breath, fever, cough and chest pain at PAT appointment  Patient verbalized understanding of instructions that were given to them at the PAT appointment. Patient was also instructed that they will need to review over the PAT instructions again at home before surgery.

## 2023-10-24 ENCOUNTER — Other Ambulatory Visit: Payer: Self-pay

## 2023-10-24 ENCOUNTER — Encounter (HOSPITAL_COMMUNITY)
Admission: RE | Admit: 2023-10-24 | Discharge: 2023-10-24 | Disposition: A | Discharge status: Home / Self Care | Source: Ambulatory Visit | Attending: Surgery | Admitting: Surgery

## 2023-10-24 ENCOUNTER — Encounter (HOSPITAL_COMMUNITY): Payer: Self-pay

## 2023-10-24 DIAGNOSIS — Z01812 Encounter for preprocedural laboratory examination: Secondary | ICD-10-CM | POA: Insufficient documentation

## 2023-10-24 DIAGNOSIS — D649 Anemia, unspecified: Secondary | ICD-10-CM | POA: Diagnosis not present

## 2023-10-24 DIAGNOSIS — Z01818 Encounter for other preprocedural examination: Secondary | ICD-10-CM

## 2023-10-24 HISTORY — DX: Anemia, unspecified: D64.9

## 2023-10-24 LAB — CBC WITH DIFFERENTIAL/PLATELET
Abs Immature Granulocytes: 0.02 10*3/uL (ref 0.00–0.07)
Basophils Absolute: 0 10*3/uL (ref 0.0–0.1)
Basophils Relative: 1 %
Eosinophils Absolute: 0.1 10*3/uL (ref 0.0–0.5)
Eosinophils Relative: 1 %
HCT: 45.3 % (ref 36.0–46.0)
Hemoglobin: 14.5 g/dL (ref 12.0–15.0)
Immature Granulocytes: 0 %
Lymphocytes Relative: 17 %
Lymphs Abs: 1.1 10*3/uL (ref 0.7–4.0)
MCH: 26.3 pg (ref 26.0–34.0)
MCHC: 32 g/dL (ref 30.0–36.0)
MCV: 82.2 fL (ref 80.0–100.0)
Monocytes Absolute: 0.5 10*3/uL (ref 0.1–1.0)
Monocytes Relative: 7 %
Neutro Abs: 5 10*3/uL (ref 1.7–7.7)
Neutrophils Relative %: 74 %
Platelets: 155 10*3/uL (ref 150–400)
RBC: 5.51 MIL/uL — ABNORMAL HIGH (ref 3.87–5.11)
RDW: 21.1 % — ABNORMAL HIGH (ref 11.5–15.5)
WBC: 6.7 10*3/uL (ref 4.0–10.5)
nRBC: 0 % (ref 0.0–0.2)

## 2023-10-24 LAB — BASIC METABOLIC PANEL WITH GFR
Anion gap: 9 (ref 5–15)
BUN: 9 mg/dL (ref 8–23)
CO2: 25 mmol/L (ref 22–32)
Calcium: 9.3 mg/dL (ref 8.9–10.3)
Chloride: 107 mmol/L (ref 98–111)
Creatinine, Ser: 0.65 mg/dL (ref 0.44–1.00)
GFR, Estimated: >60 mL/min (ref >60)
Glucose, Bld: 111 mg/dL — ABNORMAL HIGH (ref 70–99)
Potassium: 4.3 mmol/L (ref 3.5–5.1)
Sodium: 141 mmol/L (ref 135–145)

## 2023-10-24 NOTE — Progress Notes (Addendum)
  Date of COVID positive in last 97 days:no  PCP - Barnie Louder, MD Cardiologist -   Chest x-ray -  CT chest 09-25-23 Epic EKG -  Stress Test -  ECHO -  Cardiac Cath -  Pacemaker/ICD device last checked: Spinal Cord Stimulator:  Bowel Prep -   Sleep Study - no CPAP - no  Fasting Blood Sugar - no Checks Blood Sugar __0___ times a day  Last dose of GLP1 agonist-  N/A GLP1 instructions:  Do not take after     Last dose of SGLT-2 inhibitors-  N/A SGLT-2 instructions:  Do not take after     Blood Thinner Instructions:  Last dose: no  Time: Aspirin Instructions:no Last Dose:  Activity level:  Can go up a flight of stairs and perform activities of daily living without stopping and without symptoms of chest pain or shortness of breath.  Able to exercise without symptoms   Anesthesia review  Patient denies shortness of breath, fever, cough and chest pain at PAT appointment  Patient verbalized understanding of instructions that were given to them at the PAT appointment. Patient was also instructed that they will need to review over the PAT instructions again at home before surgery.

## 2023-10-31 ENCOUNTER — Encounter (HOSPITAL_COMMUNITY)
Admission: RE | Disposition: A | Discharge status: Home / Self Care | Payer: Self-pay | Source: Home / Self Care | Attending: Surgery

## 2023-10-31 ENCOUNTER — Inpatient Hospital Stay (HOSPITAL_COMMUNITY): Admitting: Registered Nurse

## 2023-10-31 ENCOUNTER — Other Ambulatory Visit: Payer: Self-pay

## 2023-10-31 ENCOUNTER — Inpatient Hospital Stay (HOSPITAL_COMMUNITY)
Admission: RE | Admit: 2023-10-31 | Discharge: 2023-11-02 | DRG: 330 | Disposition: A | Discharge status: Home / Self Care | Attending: Surgery | Admitting: Surgery

## 2023-10-31 ENCOUNTER — Encounter (HOSPITAL_COMMUNITY): Payer: Self-pay | Admitting: Surgery

## 2023-10-31 DIAGNOSIS — E876 Hypokalemia: Secondary | ICD-10-CM | POA: Diagnosis not present

## 2023-10-31 DIAGNOSIS — Z9151 Personal history of suicidal behavior: Secondary | ICD-10-CM | POA: Diagnosis not present

## 2023-10-31 DIAGNOSIS — Q438 Other specified congenital malformations of intestine: Secondary | ICD-10-CM | POA: Diagnosis not present

## 2023-10-31 DIAGNOSIS — I10 Essential (primary) hypertension: Secondary | ICD-10-CM | POA: Diagnosis not present

## 2023-10-31 DIAGNOSIS — F419 Anxiety disorder, unspecified: Secondary | ICD-10-CM | POA: Diagnosis present

## 2023-10-31 DIAGNOSIS — D125 Benign neoplasm of sigmoid colon: Principal | ICD-10-CM | POA: Diagnosis present

## 2023-10-31 DIAGNOSIS — Z9049 Acquired absence of other specified parts of digestive tract: Principal | ICD-10-CM

## 2023-10-31 DIAGNOSIS — I2510 Atherosclerotic heart disease of native coronary artery without angina pectoris: Secondary | ICD-10-CM | POA: Diagnosis present

## 2023-10-31 DIAGNOSIS — K6389 Other specified diseases of intestine: Secondary | ICD-10-CM

## 2023-10-31 DIAGNOSIS — Z87891 Personal history of nicotine dependence: Secondary | ICD-10-CM

## 2023-10-31 DIAGNOSIS — F32A Depression, unspecified: Secondary | ICD-10-CM | POA: Diagnosis present

## 2023-10-31 DIAGNOSIS — Z96612 Presence of left artificial shoulder joint: Secondary | ICD-10-CM | POA: Diagnosis not present

## 2023-10-31 DIAGNOSIS — D127 Benign neoplasm of rectosigmoid junction: Secondary | ICD-10-CM | POA: Diagnosis not present

## 2023-10-31 DIAGNOSIS — Z880 Allergy status to penicillin: Secondary | ICD-10-CM

## 2023-10-31 DIAGNOSIS — K219 Gastro-esophageal reflux disease without esophagitis: Secondary | ICD-10-CM | POA: Diagnosis not present

## 2023-10-31 HISTORY — PX: COLECTOMY, SIGMOID, ROBOT-ASSISTED: SHX7542

## 2023-10-31 HISTORY — PX: FLEXIBLE SIGMOIDOSCOPY: SHX5431

## 2023-10-31 LAB — ABO/RH: ABO/RH(D): AB NEG

## 2023-10-31 LAB — TYPE AND SCREEN
ABO/RH(D): AB NEG
Antibody Screen: NEGATIVE

## 2023-10-31 SURGERY — COLECTOMY, SIGMOID, ROBOT-ASSISTED
Anesthesia: General

## 2023-10-31 MED ORDER — DIPHENHYDRAMINE HCL 50 MG/ML IJ SOLN: 12.5000 mg | Freq: Four times a day (QID) | INTRAMUSCULAR | Status: DC | PRN

## 2023-10-31 MED ORDER — BISACODYL 5 MG PO TBEC: 20.0000 mg | DELAYED_RELEASE_TABLET | Freq: Once | ORAL | Status: DC

## 2023-10-31 MED ORDER — POLYETHYLENE GLYCOL 3350 17 GM/SCOOP PO POWD: 238.0000 g | Freq: Once | ORAL | Status: DC

## 2023-10-31 MED ORDER — FERROUS SULFATE 325 (65 FE) MG PO TABS
325.0000 mg | ORAL_TABLET | Freq: Every day | ORAL | Status: DC
  Administered 2023-11-01 – 2023-11-02 (×2): 325 mg via ORAL
  Filled 2023-10-31 (×3): qty 1

## 2023-10-31 MED ORDER — ACETAMINOPHEN 500 MG PO TABS
1000.0000 mg | ORAL_TABLET | ORAL | Status: AC
  Administered 2023-10-31: 1000 mg via ORAL

## 2023-10-31 MED ORDER — ONDANSETRON HCL 4 MG/2ML IJ SOLN
INTRAMUSCULAR | Status: AC
Start: 2023-10-31 — End: 2023-10-31
  Filled 2023-10-31: qty 2

## 2023-10-31 MED ORDER — ALVIMOPAN 12 MG PO CAPS
ORAL_CAPSULE | ORAL | Status: AC
  Filled 2023-10-31: qty 1

## 2023-10-31 MED ORDER — ONDANSETRON HCL 4 MG PO TABS: 4.0000 mg | ORAL_TABLET | Freq: Four times a day (QID) | ORAL | Status: DC | PRN

## 2023-10-31 MED ORDER — SIMETHICONE 80 MG PO CHEW: 40.0000 mg | CHEWABLE_TABLET | Freq: Four times a day (QID) | ORAL | Status: DC | PRN

## 2023-10-31 MED ORDER — BUPIVACAINE-EPINEPHRINE (PF) 0.25% -1:200000 IJ SOLN
INTRAMUSCULAR | Status: AC
  Filled 2023-10-31: qty 30

## 2023-10-31 MED ORDER — METRONIDAZOLE 500 MG PO TABS: 1000.0000 mg | ORAL_TABLET | ORAL | Status: DC

## 2023-10-31 MED ORDER — 0.9 % SODIUM CHLORIDE (POUR BTL) OPTIME
TOPICAL | Status: DC | PRN
  Administered 2023-10-31: 2000 mL

## 2023-10-31 MED ORDER — LACTATED RINGERS IV SOLN: INTRAVENOUS | Status: DC | PRN

## 2023-10-31 MED ORDER — SUGAMMADEX SODIUM 200 MG/2ML IV SOLN
INTRAVENOUS | Status: DC | PRN
Start: 2023-10-31 — End: 2023-10-31
  Administered 2023-10-31: 200 mg via INTRAVENOUS

## 2023-10-31 MED ORDER — CHLORHEXIDINE GLUCONATE CLOTH 2 % EX PADS: 6.0000 | MEDICATED_PAD | Freq: Once | CUTANEOUS | Status: DC

## 2023-10-31 MED ORDER — CHLORHEXIDINE GLUCONATE 0.12 % MT SOLN
15.0000 mL | Freq: Once | OROMUCOSAL | Status: AC
  Administered 2023-10-31: 15 mL via OROMUCOSAL

## 2023-10-31 MED ORDER — VITAMIN B-12 1000 MCG PO TABS
1000.0000 ug | ORAL_TABLET | Freq: Every day | ORAL | Status: DC
  Administered 2023-11-01 – 2023-11-02 (×2): 1000 ug via ORAL
  Filled 2023-10-31 (×2): qty 1

## 2023-10-31 MED ORDER — DIPHENHYDRAMINE HCL 12.5 MG/5ML PO ELIX: 12.5000 mg | ORAL_SOLUTION | Freq: Four times a day (QID) | ORAL | Status: DC | PRN

## 2023-10-31 MED ORDER — DEXAMETHASONE SODIUM PHOSPHATE 10 MG/ML IJ SOLN
INTRAMUSCULAR | Status: DC | PRN
  Administered 2023-10-31: 10 mg via INTRAVENOUS

## 2023-10-31 MED ORDER — HEPARIN SODIUM (PORCINE) 5000 UNIT/ML IJ SOLN
5000.0000 [IU] | Freq: Three times a day (TID) | INTRAMUSCULAR | Status: DC
  Administered 2023-10-31 – 2023-11-02 (×5): 5000 [IU] via SUBCUTANEOUS
  Filled 2023-10-31 (×5): qty 1

## 2023-10-31 MED ORDER — ROCURONIUM BROMIDE 100 MG/10ML IV SOLN
INTRAVENOUS | Status: DC | PRN
Start: 2023-10-31 — End: 2023-10-31
  Administered 2023-10-31 (×2): 20 mg via INTRAVENOUS
  Administered 2023-10-31: 50 mg via INTRAVENOUS

## 2023-10-31 MED ORDER — ONDANSETRON HCL 4 MG/2ML IJ SOLN
INTRAMUSCULAR | Status: DC | PRN
Start: 2023-10-31 — End: 2023-10-31
  Administered 2023-10-31: 4 mg via INTRAVENOUS

## 2023-10-31 MED ORDER — HYDROMORPHONE HCL 1 MG/ML IJ SOLN
INTRAMUSCULAR | Status: DC | PRN
  Administered 2023-10-31: 1 mg via INTRAVENOUS

## 2023-10-31 MED ORDER — HEPARIN SODIUM (PORCINE) 5000 UNIT/ML IJ SOLN
5000.0000 [IU] | Freq: Once | INTRAMUSCULAR | Status: AC
Start: 2023-10-31 — End: 2023-10-31
  Administered 2023-10-31: 5000 [IU] via SUBCUTANEOUS
  Filled 2023-10-31: qty 1

## 2023-10-31 MED ORDER — LACTATED RINGERS IR SOLN
Status: DC | PRN
Start: 2023-10-31 — End: 2023-10-31
  Administered 2023-10-31: 1000 mL

## 2023-10-31 MED ORDER — PHENYLEPHRINE HCL-NACL 20-0.9 MG/250ML-% IV SOLN
INTRAVENOUS | Status: DC | PRN
  Administered 2023-10-31: 30 ug/min via INTRAVENOUS

## 2023-10-31 MED ORDER — PROPOFOL 10 MG/ML IV BOLUS
INTRAVENOUS | Status: DC | PRN
  Administered 2023-10-31: 120 mg via INTRAVENOUS

## 2023-10-31 MED ORDER — LABETALOL HCL 5 MG/ML IV SOLN
INTRAVENOUS | Status: DC | PRN
  Administered 2023-10-31: 5 mg via INTRAVENOUS
  Administered 2023-10-31: 10 mg via INTRAVENOUS

## 2023-10-31 MED ORDER — ENSURE SURGERY PO LIQD
237.0000 mL | Freq: Two times a day (BID) | ORAL | Status: DC
  Administered 2023-11-01: 237 mL via ORAL

## 2023-10-31 MED ORDER — ORAL CARE MOUTH RINSE: 15.0000 mL | Freq: Once | OROMUCOSAL | Status: AC

## 2023-10-31 MED ORDER — ONDANSETRON HCL 4 MG/2ML IJ SOLN
4.0000 mg | Freq: Four times a day (QID) | INTRAMUSCULAR | Status: DC | PRN
  Administered 2023-10-31: 4 mg via INTRAVENOUS
  Filled 2023-10-31: qty 2

## 2023-10-31 MED ORDER — ACETAMINOPHEN 500 MG PO TABS
ORAL_TABLET | ORAL | Status: AC
  Filled 2023-10-31: qty 2

## 2023-10-31 MED ORDER — ALVIMOPAN 12 MG PO CAPS
12.0000 mg | ORAL_CAPSULE | ORAL | Status: AC
  Administered 2023-10-31: 12 mg via ORAL

## 2023-10-31 MED ORDER — FENTANYL CITRATE (PF) 250 MCG/5ML IJ SOLN
INTRAMUSCULAR | Status: AC
  Filled 2023-10-31: qty 5

## 2023-10-31 MED ORDER — TRAMADOL HCL 50 MG PO TABS
50.0000 mg | ORAL_TABLET | Freq: Four times a day (QID) | ORAL | Status: DC | PRN
  Administered 2023-10-31: 50 mg via ORAL
  Filled 2023-10-31: qty 1

## 2023-10-31 MED ORDER — LABETALOL HCL 5 MG/ML IV SOLN
INTRAVENOUS | Status: AC
  Filled 2023-10-31: qty 4

## 2023-10-31 MED ORDER — BUPIVACAINE LIPOSOME 1.3 % IJ SUSP: 20.0000 mL | Freq: Once | INTRAMUSCULAR | Status: DC

## 2023-10-31 MED ORDER — LIDOCAINE HCL (PF) 2 % IJ SOLN
INTRAMUSCULAR | Status: AC
Start: 2023-10-31 — End: 2023-10-31
  Filled 2023-10-31: qty 5

## 2023-10-31 MED ORDER — DROPERIDOL 2.5 MG/ML IJ SOLN: 0.6250 mg | Freq: Once | INTRAMUSCULAR | Status: DC | PRN

## 2023-10-31 MED ORDER — ROCURONIUM BROMIDE 10 MG/ML (PF) SYRINGE
PREFILLED_SYRINGE | INTRAVENOUS | Status: AC
Start: 2023-10-31 — End: 2023-10-31
  Filled 2023-10-31: qty 10

## 2023-10-31 MED ORDER — LACTATED RINGERS IV SOLN: INTRAVENOUS | Status: DC

## 2023-10-31 MED ORDER — NEOMYCIN SULFATE 500 MG PO TABS: 1000.0000 mg | ORAL_TABLET | ORAL | Status: DC

## 2023-10-31 MED ORDER — ALUM & MAG HYDROXIDE-SIMETH 200-200-20 MG/5ML PO SUSP: 30.0000 mL | Freq: Four times a day (QID) | ORAL | Status: DC | PRN

## 2023-10-31 MED ORDER — HYDROMORPHONE HCL 2 MG/ML IJ SOLN
INTRAMUSCULAR | Status: AC
  Filled 2023-10-31: qty 1

## 2023-10-31 MED ORDER — OXYCODONE HCL 5 MG PO TABS: 5.0000 mg | ORAL_TABLET | Freq: Once | ORAL | Status: DC | PRN

## 2023-10-31 MED ORDER — ALVIMOPAN 12 MG PO CAPS
12.0000 mg | ORAL_CAPSULE | Freq: Two times a day (BID) | ORAL | Status: DC
  Administered 2023-11-01 (×2): 12 mg via ORAL
  Filled 2023-10-31 (×2): qty 1

## 2023-10-31 MED ORDER — ENSURE PRE-SURGERY PO LIQD: 296.0000 mL | Freq: Once | ORAL | Status: DC

## 2023-10-31 MED ORDER — BUPIVACAINE-EPINEPHRINE (PF) 0.25% -1:200000 IJ SOLN
INTRAMUSCULAR | Status: DC | PRN
  Administered 2023-10-31: 50 mL via PERINEURAL

## 2023-10-31 MED ORDER — IRON 325 (65 FE) MG PO TABS: 325.0000 mg | ORAL_TABLET | Freq: Every day | ORAL | Status: DC

## 2023-10-31 MED ORDER — ACETAMINOPHEN 500 MG PO TABS
1000.0000 mg | ORAL_TABLET | Freq: Four times a day (QID) | ORAL | Status: DC
  Administered 2023-10-31 – 2023-11-02 (×6): 1000 mg via ORAL
  Filled 2023-10-31 (×7): qty 2

## 2023-10-31 MED ORDER — HYDRALAZINE HCL 20 MG/ML IJ SOLN: 10.0000 mg | INTRAMUSCULAR | Status: DC | PRN

## 2023-10-31 MED ORDER — DEXAMETHASONE SODIUM PHOSPHATE 10 MG/ML IJ SOLN
INTRAMUSCULAR | Status: AC
  Filled 2023-10-31: qty 1

## 2023-10-31 MED ORDER — FENTANYL CITRATE (PF) 100 MCG/2ML IJ SOLN
INTRAMUSCULAR | Status: DC | PRN
  Administered 2023-10-31 (×3): 50 ug via INTRAVENOUS
  Administered 2023-10-31: 100 ug via INTRAVENOUS

## 2023-10-31 MED ORDER — ACETAMINOPHEN 10 MG/ML IV SOLN: 1000.0000 mg | Freq: Once | INTRAVENOUS | Status: DC | PRN

## 2023-10-31 MED ORDER — SODIUM CHLORIDE 0.9 % IV SOLN
2.0000 g | INTRAVENOUS | Status: AC
  Administered 2023-10-31: 2 g via INTRAVENOUS
  Filled 2023-10-31: qty 2

## 2023-10-31 MED ORDER — FENTANYL CITRATE PF 50 MCG/ML IJ SOSY: 25.0000 ug | PREFILLED_SYRINGE | INTRAMUSCULAR | Status: DC | PRN

## 2023-10-31 MED ORDER — SCOPOLAMINE 1 MG/3DAYS TD PT72: 1.0000 | MEDICATED_PATCH | TRANSDERMAL | Status: DC

## 2023-10-31 MED ORDER — ENSURE PRE-SURGERY PO LIQD: 592.0000 mL | Freq: Once | ORAL | Status: DC

## 2023-10-31 MED ORDER — HYDROMORPHONE HCL 1 MG/ML IJ SOLN
0.5000 mg | INTRAMUSCULAR | Status: DC | PRN
  Administered 2023-10-31 – 2023-11-01 (×2): 0.5 mg via INTRAVENOUS
  Filled 2023-10-31 (×2): qty 0.5

## 2023-10-31 MED ORDER — IBUPROFEN 400 MG PO TABS: 600.0000 mg | ORAL_TABLET | Freq: Four times a day (QID) | ORAL | Status: DC | PRN

## 2023-10-31 MED ORDER — PROPOFOL 500 MG/50ML IV EMUL
INTRAVENOUS | Status: DC | PRN
  Administered 2023-10-31: 50 ug/kg/min via INTRAVENOUS

## 2023-10-31 MED ORDER — LIDOCAINE HCL (CARDIAC) PF 100 MG/5ML IV SOSY
PREFILLED_SYRINGE | INTRAVENOUS | Status: DC | PRN
  Administered 2023-10-31: 60 mg via INTRAVENOUS

## 2023-10-31 MED ORDER — INDOCYANINE GREEN 25 MG IV SOLR
INTRAVENOUS | Status: DC | PRN
  Administered 2023-10-31: 2.5 mg via INTRAVENOUS

## 2023-10-31 MED ORDER — OXYCODONE HCL 5 MG/5ML PO SOLN: 5.0000 mg | Freq: Once | ORAL | Status: DC | PRN

## 2023-10-31 MED ORDER — PROPOFOL 10 MG/ML IV BOLUS
INTRAVENOUS | Status: AC
  Filled 2023-10-31: qty 20

## 2023-10-31 SURGICAL SUPPLY — 91 items
BAG COUNTER SPONGE SURGICOUNT (BAG) IMPLANT
BLADE EXTENDED COATED 6.5IN (ELECTRODE) ×1 IMPLANT
CANNULA REDUCER 12-8 DVNC XI (CANNULA) ×1 IMPLANT
CELLS DAT CNTRL 66122 CELL SVR (MISCELLANEOUS) IMPLANT
CHLORAPREP W/TINT 26 (MISCELLANEOUS) ×1 IMPLANT
CLIP APPLIE 5 13 M/L LIGAMAX5 (MISCELLANEOUS) IMPLANT
CLIP APPLIE ROT 10 11.4 M/L (STAPLE) IMPLANT
CLIP LIGATING HEM O LOK PURPLE (MISCELLANEOUS) IMPLANT
CLIP LIGATING HEMO O LOK GREEN (MISCELLANEOUS) IMPLANT
COVER SURGICAL LIGHT HANDLE (MISCELLANEOUS) ×2 IMPLANT
COVER TIP SHEARS 8 DVNC (MISCELLANEOUS) ×1 IMPLANT
DEFOGGER SCOPE WARM SEASHARP (MISCELLANEOUS) ×2 IMPLANT
DERMABOND ADVANCED .7 DNX12 (GAUZE/BANDAGES/DRESSINGS) IMPLANT
DEVICE TROCAR PUNCTURE CLOSURE (ENDOMECHANICALS) IMPLANT
DRAIN CHANNEL 19F RND (DRAIN) ×1 IMPLANT
DRAPE ARM DVNC X/XI (DISPOSABLE) ×4 IMPLANT
DRAPE COLUMN DVNC XI (DISPOSABLE) ×1 IMPLANT
DRAPE CV SPLIT W-CLR ANES SCRN (DRAPES) ×1 IMPLANT
DRAPE PERI GROIN 82X75IN TIB (DRAPES) ×1 IMPLANT
DRAPE SURG IRRIG POUCH 19X23 (DRAPES) ×1 IMPLANT
DRIVER NDL LRG 8 DVNC XI (INSTRUMENTS) ×1 IMPLANT
DRIVER NDLE LRG 8 DVNC XI (INSTRUMENTS) ×1 IMPLANT
DRSG OPSITE POSTOP 4X10 (GAUZE/BANDAGES/DRESSINGS) IMPLANT
DRSG OPSITE POSTOP 4X6 (GAUZE/BANDAGES/DRESSINGS) IMPLANT
DRSG OPSITE POSTOP 4X8 (GAUZE/BANDAGES/DRESSINGS) IMPLANT
DRSG TEGADERM 2-3/8X2-3/4 SM (GAUZE/BANDAGES/DRESSINGS) ×5 IMPLANT
DRSG TEGADERM 4X4.75 (GAUZE/BANDAGES/DRESSINGS) ×1 IMPLANT
ELECT REM PT RETURN 15FT ADLT (MISCELLANEOUS) ×1 IMPLANT
ENDOLOOP SUT PDS II 0 18 (SUTURE) IMPLANT
EVACUATOR SILICONE 100CC (DRAIN) ×1 IMPLANT
GAUZE SPONGE 2X2 8PLY STRL LF (GAUZE/BANDAGES/DRESSINGS) ×1 IMPLANT
GAUZE SPONGE 4X4 12PLY STRL (GAUZE/BANDAGES/DRESSINGS) IMPLANT
GLOVE BIO SURGEON STRL SZ7.5 (GLOVE) ×3 IMPLANT
GLOVE INDICATOR 8.0 STRL GRN (GLOVE) ×3 IMPLANT
GOWN SRG XL LVL 4 BRTHBL STRL (GOWNS) ×1 IMPLANT
GOWN STRL REUS W/ TWL XL LVL3 (GOWN DISPOSABLE) ×5 IMPLANT
GRASPER SUT TROCAR 14GX15 (MISCELLANEOUS) IMPLANT
GRASPER TIP-UP FEN DVNC XI (INSTRUMENTS) ×1 IMPLANT
HOLDER FOLEY CATH W/STRAP (MISCELLANEOUS) ×1 IMPLANT
IRRIGATION SUCT STRKRFLW 2 WTP (MISCELLANEOUS) ×1 IMPLANT
KIT PROCEDURE DVNC SI (MISCELLANEOUS) IMPLANT
KIT TURNOVER KIT A (KITS) ×1 IMPLANT
NDL INSUFFLATION 14GA 120MM (NEEDLE) ×1 IMPLANT
NEEDLE INSUFFLATION 14GA 120MM (NEEDLE) ×1 IMPLANT
PACK COLON (CUSTOM PROCEDURE TRAY) ×1 IMPLANT
PAD POSITIONING PINK XL (MISCELLANEOUS) ×1 IMPLANT
PENCIL SMOKE EVACUATOR (MISCELLANEOUS) IMPLANT
PROTECTOR NERVE ULNAR (MISCELLANEOUS) ×2 IMPLANT
RELOAD STAPLE 45 3.5 BLU DVNC (STAPLE) IMPLANT
RELOAD STAPLE 45 4.3 GRN DVNC (STAPLE) IMPLANT
RELOAD STAPLE 60 3.5 BLU DVNC (STAPLE) IMPLANT
RELOAD STAPLE 60 4.3 GRN DVNC (STAPLE) IMPLANT
RELOAD STAPLER 3.5X45 BLU DVNC (STAPLE) IMPLANT
RELOAD STAPLER 3.5X60 BLU DVNC (STAPLE) IMPLANT
RELOAD STAPLER 4.3X45 GRN DVNC (STAPLE) IMPLANT
RELOAD STAPLER 4.3X60 GRN DVNC (STAPLE) ×1 IMPLANT
RETRACTOR WND ALEXIS 18 MED (MISCELLANEOUS) IMPLANT
SCISSORS LAP 5X35 DISP (ENDOMECHANICALS) IMPLANT
SCISSORS MNPLR CVD DVNC XI (INSTRUMENTS) ×1 IMPLANT
SEAL UNIV 5-12 XI (MISCELLANEOUS) ×4 IMPLANT
SEALER VESSEL EXT DVNC XI (MISCELLANEOUS) ×1 IMPLANT
SLEEVE ADV FIXATION 5X100MM (TROCAR) IMPLANT
SOLUTION ELECTROSURG ANTI STCK (MISCELLANEOUS) ×1 IMPLANT
SPIKE FLUID TRANSFER (MISCELLANEOUS) ×1 IMPLANT
STAPLER 60 SUREFORM DVNC (STAPLE) IMPLANT
STAPLER ECHELON POWER CIR 29 (STAPLE) IMPLANT
STAPLER ECHELON POWER CIR 31 (STAPLE) IMPLANT
STOPCOCK 4 WAY LG BORE MALE ST (IV SETS) ×2 IMPLANT
SURGILUBE 2OZ TUBE FLIPTOP (MISCELLANEOUS) ×1 IMPLANT
SUT MNCRL AB 4-0 PS2 18 (SUTURE) ×1 IMPLANT
SUT PDS AB 1 CT1 27 (SUTURE) IMPLANT
SUT PDS AB 1 TP1 96 (SUTURE) IMPLANT
SUT PROLENE 0 CT 2 (SUTURE) IMPLANT
SUT PROLENE 2 0 KS (SUTURE) ×1 IMPLANT
SUT PROLENE 2 0 SH DA (SUTURE) IMPLANT
SUT SILK 2 0 SH CR/8 (SUTURE) IMPLANT
SUT SILK 2-0 18XBRD TIE 12 (SUTURE) IMPLANT
SUT SILK 3 0 SH CR/8 (SUTURE) ×1 IMPLANT
SUT SILK 3-0 18XBRD TIE 12 (SUTURE) ×1 IMPLANT
SUT VIC AB 3-0 SH 18 (SUTURE) IMPLANT
SUT VIC AB 3-0 SH 27XBRD (SUTURE) IMPLANT
SUT VICRYL 0 UR6 27IN ABS (SUTURE) ×1 IMPLANT
SUTURE V-LC BRB 180 2/0GR6GS22 (SUTURE) IMPLANT
SYR 10ML LL (SYRINGE) ×1 IMPLANT
SYSTEM LAPSCP GELPORT 120MM (MISCELLANEOUS) IMPLANT
SYSTEM WOUND ALEXIS 18CM MED (MISCELLANEOUS) ×1 IMPLANT
TAPE UMBILICAL 1/8 X36 TWILL (MISCELLANEOUS) ×1 IMPLANT
TRAY FOLEY MTR SLVR 16FR STAT (SET/KITS/TRAYS/PACK) ×1 IMPLANT
TROCAR ADV FIXATION 5X100MM (TROCAR) ×1 IMPLANT
TUBING CONNECTING 10 (TUBING) ×3 IMPLANT
TUBING INSUFFLATION 10FT LAP (TUBING) ×1 IMPLANT

## 2023-10-31 NOTE — H&P (Signed)
 CC: Here today for surgery  HPI: Debra Jackson is an 68 y.o. female with history of HTN, adhd, whom is seen in the office today as a referral by Dr. San for evaluation of sigmoid polypoid mass.   with hx of HTN, tobacco use whom presented to the emergency department 06/02/20 for evaluation of abdominal distention and nausea. She denied any blood in her stool. She does report history of diarrhea for a few months preceding this. The CT scan demonstrated thickening in her proximal sigmoid colon. She was referred for colonoscopy. She is taken for colonoscopy with Dr. San 09/17/20:  1. 2 sessile polyps in rectum removed 2. 18 mm polyp in sigmoid removed 3. 12 mm polyp desc colon removed 4. 15 mm polyp asc colon removed 5. Large frond-like/villous partially obstructing masses, sigmoid colon. This was partially circumferential involving half the lumen. This was 4 cm in length and located 24-28 cm from the anal verge. This is biopsied and tattooed 1 cm distal to the lesion. Biopsy returned TVA without evidence of high-grade dysplasia or malignancy 6. significant tortuosity of the ascending colon requiring manual application of pressure  From last office visit -- She was initially lost to follow-up. She returns today interested in having surgery for her sigmoid polypoid mass. In the interval, she has had an issue with pancreatitis 02/2021, presumably alcohol related. She states that for the last 2 to 3 months, she has been sober not had a drop of alcohol. She had a fall approximately 10 days ago while walking her dog and sustained a humerus fracture. She reports that the orthopedic surgery evaluation concluded that they would see how things are in 3 months and decide whether or not she needs to have surgery for this but in the meantime will be kept in a sling. She denies any abdominal pain, nausea or vomiting. She denies any blood in her stool. She denies any weight loss.  She is  accompanied here with her husband. He reports that his brother is on the board of directors for [employer] in [place], [place] .  Labs 05/12/21 reviewed - Albumin 3.8  TBili 0.3 (previously 2.0 -->1.4 over course of 01/2021 -02/2021) AST 44 ALT 18 Alk phos 148  INR (03/13/21) 1.2  INTERVAL HX She was last seen in our office in March 2023. She has been lost to follow-up. See Dr. Rennis notes for details.  She ultimately underwent a full repeat colonoscopy with Dr. San 09/22/2023: - Hemorrhoids - Large mass in sigmoid. Biopsied. - 1 cm polyp in distal rectum removed - Diverticulosis in sigmoid - Internal hemorrhoids - Significant looping  PATH 1. Sigmoid Colon Biopsy, mass :  SUPERFICIAL FRAGMENTS OF TUBULOVILLOUS ADENOMA.  MINIMAL STROMA PRESENT TO EVALUATE FOR INVASION.  NEGATIVE FOR HIGH-GRADE DYSPLASIA IN THE SUPERFICIAL FRAGMENTS.  SEE NOTE.   2. Rectum, polyp(s), :  TUBULAR ADENOMA (2) WITHOUT HIGH GRADE DYSPLASIA.   CT CAP 09/25/23 1. Expansile mass of the proximal sigmoid colon measuring 4.9 x 3.6 x 4.2 cm, consistent with primary colon malignancy. 2. No evidence of lymphadenopathy or metastatic disease in the chest, abdomen, or pelvis. 3. Mildly coarse contour of the liver, suggestive of cirrhosis. Correlate with biochemical findings. 4. Moderate hiatal hernia. 5. Nonobstructive left nephrolithiasis. 6. Coronary artery disease  Labs 08/29/23  Tbili 0.4 Cr 1.00 Alb 4.1  Hgb 11.3 Plt 180  Has otherwise been well. She reports she has turned her life around. Denies any complaints today. Denies any use of  tobacco or alcohol. States she can easily walk up 2 flights stairs about stopping a denies any chest pain or shortness of breath. Denies any abdominal pain. Denies any blood in her stool.  She denies any changes in health or health history since we met in the office. No new medications/allergies. She states she is ready for surgery today. She  completed bowel prep with satisfactory result.  PMH: HTN, tobacco use, adhd; also reports she takes seroquel  PSH: she denies any prior abdominal or pelvic surgical history  FHx: she states that she is adopted and is not aware of her family history  Social Hx: previously quit smoking but started smoking when we saw her last, has since quit again. She denies any drug use. She is not working. She maintains a somewhat active lifestyle walking her 3 dogs.    Past Medical History:  Diagnosis Date   ADHD    Anemia    Anxiety    Colonic mass    Depression    Diarrhea    GERD (gastroesophageal reflux disease)    Opiate abuse, continuous (HCC)    Pancreatitis, acute    Polysubstance abuse (HCC)    Substance abuse (HCC)     Past Surgical History:  Procedure Laterality Date   BIOPSY  03/14/2021   Procedure: BIOPSY;  Surgeon: Teressa Toribio SQUIBB, MD;  Location: [facility] ENDOSCOPY;  Service: Endoscopy;;   ESOPHAGOGASTRODUODENOSCOPY (EGD) WITH PROPOFOL  N/A 03/14/2021   Procedure: ESOPHAGOGASTRODUODENOSCOPY (EGD) WITH PROPOFOL ;  Surgeon: Teressa Toribio SQUIBB, MD;  Location: [facility] ENDOSCOPY;  Service: Endoscopy;  Laterality: N/A;   REVERSE SHOULDER ARTHROPLASTY Left 08/10/2021   Procedure: REVERSE SHOULDER ARTHROPLASTY;  Surgeon: Sharl Selinda Dover, MD;  Location[facility] OR;  Service: Orthopedics;  Laterality: Left;   TONSILLECTOMY      Family History  Adopted: Yes  Problem Relation Age of Onset   Colon cancer Neg Hx    Colon polyps Neg Hx    Esophageal cancer Neg Hx    Rectal cancer Neg Hx    Stomach cancer Neg Hx     Social:  reports that she has quit smoking. Her smoking use included cigarettes. She has never used smokeless tobacco. She reports that she does not currently use alcohol. She reports that she does not currently use drugs after having used the following drugs: Cocaine.  Allergies:  Allergies  Allergen Reactions   Penicillins Nausea And Vomiting    Medications: I have reviewed the  patient's current medications.  No results found for this or any previous visit (from the past 48 hours).  No results found.   PE There were no vitals taken for this visit. Constitutional: NAD; conversant Eyes: Moist conjunctiva; no lid lag; anicteric Lungs: Normal respiratory effort CV: RRR GI: Abd soft, NT/ND; no palpable hepatosplenomegaly Psychiatric: Appropriate affect  No results found for this or any previous visit (from the past 48 hours).  No results found.  A/P: Debra Jackson is an 68 y.o. female with hx of HTN, tobacco use here for surgery regarding unresectable polyp in proximal sigmoid colon - bx showed TVA without evident HGD/malignancy; CT 05/2020 showed mass in proximal sigmoid, no evident metastatic disease. Lesion is tattoo'd 1 cm distally here for evaluation of sigmoid polypoid mass  -The anatomy and physiology of the GI tract was reviewed with the patient. The pathophysiology of sigmoid polyps and cancer was discussed as well with associated pictures. -We have discussed various different treatment options going forward including surgery (the most definitive)  to address this -robotic assisted sigmoidectomy, possible low anterior resection; flexible sigmoidoscopy -The planned procedure, material risks (including, but not limited to, pain, bleeding, infection, scarring, need for blood transfusion, damage to surrounding structures- blood vessels/nerves/viscus/organs, damage to ureter, urine leak, leak from anastomosis, need for additional procedures, low anterior resection syndrome (LARS) = increased fecal urgency and/or frequency, scenarios where a stoma may be necessary and where it may be permanent, worsening of pre-existing medical conditions, chronic diarrhea, constipation secondary to narcotic use, hernia, recurrence, pneumonia, heart attack, stroke, death) benefits and alternatives to surgery were discussed at length. The patient's questions were answered to her  satisfaction, she voiced understanding and elected to proceed with surgery. Additionally, we discussed typical postoperative expectations and the recovery process.   Lonni Pizza, MD [facility] Surgery, A [facility]

## 2023-10-31 NOTE — Plan of Care (Signed)
 Problem: Education: Goal: Understanding of discharge needs will improve 10/31/2023 1836 by Marnette Allen Gulling, RN Outcome: Progressing 10/31/2023 1836 by Marnette Allen Gulling, RN Outcome: Progressing Goal: Verbalization of understanding of the causes of altered bowel function will improve 10/31/2023 1836 by Marnette Allen Gulling, RN Outcome: Progressing 10/31/2023 1836 by Marnette Allen Gulling, RN Outcome: Progressing   Problem: Activity: Goal: Ability to tolerate increased activity will improve 10/31/2023 1836 by Marnette Allen Gulling, RN Outcome: Progressing 10/31/2023 1836 by Marnette Allen Gulling, RN Outcome: Progressing   Problem: Bowel/Gastric: Goal: Gastrointestinal status for postoperative course will improve 10/31/2023 1836 by Marnette Allen Gulling, RN Outcome: Progressing 10/31/2023 1836 by Marnette Allen Gulling, RN Outcome: Progressing   Problem: Health Behavior/Discharge Planning: Goal: Identification of community resources to assist with postoperative recovery needs will improve 10/31/2023 1836 by Marnette Allen Gulling, RN Outcome: Progressing 10/31/2023 1836 by Marnette Allen Gulling, RN Outcome: Progressing   Problem: Nutritional: Goal: Will attain and maintain optimal nutritional status will improve 10/31/2023 1836 by Marnette Allen Gulling, RN Outcome: Progressing 10/31/2023 1836 by Marnette Allen Gulling, RN Outcome: Progressing   Problem: Clinical Measurements: Goal: Postoperative complications will be avoided or minimized 10/31/2023 1836 by Marnette Allen Gulling, RN Outcome: Progressing   Problem: Respiratory: Goal: Respiratory status will improve 10/31/2023 1836 by Marnette Allen Gulling, RN Outcome: Progressing 10/31/2023 1836 by Marnette Allen Gulling, RN Outcome: Progressing   Problem: Education: Goal: Knowledge of General Education information will improve Description: Including pain rating scale,  medication(s)/side effects and non-pharmacologic comfort measures 10/31/2023 1836 by Marnette Allen Gulling, RN Outcome: Progressing 10/31/2023 1836 by Marnette Allen Gulling, RN Outcome: Progressing   Problem: Health Behavior/Discharge Planning: Goal: Ability to manage health-related needs will improve 10/31/2023 1836 by Marnette Allen Gulling, RN Outcome: Progressing 10/31/2023 1836 by Marnette Allen Gulling, RN Outcome: Progressing   Problem: Clinical Measurements: Goal: Ability to maintain clinical measurements within normal limits will improve 10/31/2023 1836 by Marnette Allen Gulling, RN Outcome: Progressing 10/31/2023 1836 by Marnette Allen Gulling, RN Outcome: Progressing Goal: Will remain free from infection 10/31/2023 1836 by Marnette Allen Gulling, RN Outcome: Progressing 10/31/2023 1836 by Marnette Allen Gulling, RN Outcome: Progressing Goal: Diagnostic test results will improve 10/31/2023 1836 by Marnette Allen Gulling, RN Outcome: Progressing 10/31/2023 1836 by Marnette Allen Gulling, RN Outcome: Progressing Goal: Respiratory complications will improve 10/31/2023 1836 by Marnette Allen Gulling, RN Outcome: Progressing 10/31/2023 1836 by Marnette Allen Gulling, RN Outcome: Progressing Goal: Cardiovascular complication will be avoided 10/31/2023 1836 by Marnette Allen Gulling, RN Outcome: Progressing 10/31/2023 1836 by Marnette Allen Gulling, RN Outcome: Progressing   Problem: Nutrition: Goal: Adequate nutrition will be maintained 10/31/2023 1836 by Marnette Allen Gulling, RN Outcome: Progressing 10/31/2023 1836 by Marnette Allen Gulling, RN Outcome: Progressing   Problem: Activity: Goal: Risk for activity intolerance will decrease 10/31/2023 1836 by Marnette Allen Gulling, RN Outcome: Progressing 10/31/2023 1836 by Marnette Allen Gulling, RN Outcome: Progressing   Problem: Coping: Goal: Level of anxiety will decrease 10/31/2023 1836  by Marnette Allen Gulling, RN Outcome: Progressing 10/31/2023 1836 by Marnette Allen Gulling, RN Outcome: Progressing   Problem: Elimination: Goal: Will not experience complications related to bowel motility 10/31/2023 1836 by Marnette Allen Gulling, RN Outcome: Progressing 10/31/2023 1836 by Marnette Allen Gulling, RN Outcome: Progressing Goal: Will not experience complications related to urinary retention 10/31/2023 1836 by Marnette Allen Gulling, RN Outcome: Progressing 10/31/2023 1836 by Marnette Allen Gulling, RN Outcome: Progressing   Problem: Pain Managment: Goal: General experience of comfort will improve and/or be controlled 10/31/2023 1836  by Marnette Allen Gulling, RN Outcome: Progressing 10/31/2023 1836 by Marnette Allen Gulling, RN Outcome: Progressing   Problem: Safety: Goal: Ability to remain free from injury will improve 10/31/2023 1836 by Marnette Allen Gulling, RN Outcome: Progressing 10/31/2023 1836 by Marnette Allen Gulling, RN Outcome: Progressing

## 2023-10-31 NOTE — Anesthesia Preprocedure Evaluation (Addendum)
 Anesthesia Evaluation  Patient identified by MRN, date of birth, ID band Patient awake    Reviewed: Allergy & Precautions, NPO status , Patient's Chart, lab work & pertinent test results  Airway Mallampati: II  TM Distance: >3 FB Neck ROM: Full    Dental  (+) Teeth Intact, Dental Advisory Given   Pulmonary former smoker   breath sounds clear to auscultation       Cardiovascular negative cardio ROS  Rhythm:Regular Rate:Normal     Neuro/Psych  PSYCHIATRIC DISORDERS Anxiety Depression       GI/Hepatic Neg liver ROS,GERD  ,,  Endo/Other  negative endocrine ROS    Renal/GU negative Renal ROS  negative genitourinary   Musculoskeletal negative musculoskeletal ROS (+)    Abdominal   Peds  Hematology  (+) Blood dyscrasia, anemia   Anesthesia Other Findings   Reproductive/Obstetrics                              Anesthesia Physical Anesthesia Plan  ASA: 2  Anesthesia Plan: General   Post-op Pain Management: Tylenol  PO (pre-op)* and Toradol IV (intra-op)*   Induction: Intravenous  PONV Risk Score and Plan: 4 or greater and Ondansetron , Scopolamine  patch - Pre-op, Midazolam  and Dexamethasone   Airway Management Planned: Oral ETT  Additional Equipment: None  Intra-op Plan:   Post-operative Plan: Extubation in OR  Informed Consent: I have reviewed the patients History and Physical, chart, labs and discussed the procedure including the risks, benefits and alternatives for the proposed anesthesia with the patient or authorized representative who has indicated his/her understanding and acceptance.     Dental advisory given  Plan Discussed with: CRNA  Anesthesia Plan Comments:          Anesthesia Quick Evaluation

## 2023-10-31 NOTE — Anesthesia Postprocedure Evaluation (Signed)
 Anesthesia Post Note  Patient: Debra Jackson  Procedure(s) Performed: COLECTOMY, SIGMOID, ROBOT-ASSISTED SIGMOIDOSCOPY, FLEXIBLE     Patient location during evaluation: PACU Anesthesia Type: General Level of consciousness: awake and alert Pain management: pain level controlled Vital Signs Assessment: post-procedure vital signs reviewed and stable Respiratory status: spontaneous breathing, nonlabored ventilation, respiratory function stable and patient connected to nasal cannula oxygen  Cardiovascular status: blood pressure returned to baseline and stable Postop Assessment: no apparent nausea or vomiting Anesthetic complications: no   No notable events documented.  Last Vitals:  Vitals:   10/31/23 1935 10/31/23 2023  BP: 134/68 139/75  Pulse: 67 76  Resp: 18 18  Temp: 36.9 C 36.9 C  SpO2: 97% 97%    Last Pain:  Vitals:   10/31/23 2023  TempSrc: Oral  PainSc:                  [name]

## 2023-10-31 NOTE — Transfer of Care (Signed)
 Immediate Anesthesia Transfer of Care Note  Patient: Debra Jackson  Procedure(s) Performed: COLECTOMY, SIGMOID, ROBOT-ASSISTED SIGMOIDOSCOPY, FLEXIBLE  Patient Location: PACU  Anesthesia Type:General  Level of Consciousness: awake  Airway & Oxygen  Therapy: Patient Spontanous Breathing and Patient connected to face mask oxygen   Post-op Assessment: Report given to RN and Post -op Vital signs reviewed and stable  Post vital signs: Reviewed and stable  Last Vitals:  Vitals Value Taken Time  BP 140/70 10/31/23 16:46  Temp    Pulse 66 10/31/23 16:49  Resp 16 10/31/23 16:51  SpO2 99 % 10/31/23 16:49  Vitals shown include unfiled device data.  Last Pain:  Vitals:   10/31/23 1215  TempSrc:   PainSc: 0-No pain         Complications: No notable events documented.

## 2023-10-31 NOTE — Anesthesia Procedure Notes (Signed)
 Procedure Name: Intubation Date/Time: 10/31/2023 1:50 PM  Performed by: Lanning Cena RAMAN, CRNAPre-anesthesia Checklist: Patient identified, Emergency Drugs available, Suction available, Patient being monitored and Timeout performed Patient Re-evaluated:Patient Re-evaluated prior to induction Oxygen  Delivery Method: Circle system utilized Preoxygenation: Pre-oxygenation with 100% oxygen  Induction Type: IV induction Ventilation: Mask ventilation without difficulty Laryngoscope Size: Miller and 2 Grade View: Grade II Tube type: Oral Tube size: 7.0 mm Number of attempts: 1 Airway Equipment and Method: Stylet Placement Confirmation: ETT inserted through vocal cords under direct vision, positive ETCO2, breath sounds checked- equal and bilateral and CO2 detector Secured at: 21 cm Tube secured with: Tape Dental Injury: Teeth and Oropharynx as per pre-operative assessment

## 2023-10-31 NOTE — Discharge Instructions (Signed)

## 2023-10-31 NOTE — Op Note (Signed)
 PATIENT: Debra Jackson  68 y.o. female  Patient Care Team: Vicci Barnie NOVAK, MD as PCP - General (Internal Medicine)  PREOP DIAGNOSIS: SIGMOID MASS  POSTOP DIAGNOSIS: SIGMOID MASS  PROCEDURE:  Robotic assisted anterior resection with double-stapled colorectal anastomosis Intraoperative assessment of perfusion using ICG fluorescence imaging Flexible sigmoidoscopy Bilateral transversus abdominus plane (TAP) blocks  SURGEON: Lonni HERO. Teresa, MD  ASSISTANT: Camellia Blush, MD  An experienced assistant was required given the complexity of this procedure and the standard of surgical care. My assistant helped with exposure through counter tension, suctioning, ligation and retraction to better visualize the surgical field. My assistant expedited sewing during the case by following my sutures. Wherever I use the term we in the report, my assistant actively helped me with that portion of the procedure.   ANESTHESIA: General endotracheal  EBL: 50 mL Total I/O In: 100 [IV Piggyback:100] Out: 40 [Urine:40]  DRAINS: None  SPECIMEN: Rectosigmoid colon - open end proximal  COUNTS: Sponge, needle and instrument counts were reported correct x2  FINDINGS: No evident metastatic disease on visceral parietal peritoneum or liver.  Associated tattoo at approximately the mid sigmoid colon.  Rather large, redundant amounts of sigmoid colon.  A well perfused, tension free, hemostatic, air tight 29 mm EEA colorectal anastomosis fashioned 15 cm from the anal verge by flexible sigmoidoscopy.   NARRATIVE: Informed consent was verified. The patient was taken to the operating room, placed supine on the operating table and SCD's were applied. General endotracheal anesthesia was induced without difficulty.  She was then positioned in the lithotomy position with Allen stirrups.  Pressure points were evaluated and padded.  A foley catheter was then placed by nursing under sterile conditions. Hair on the  abdomen was clipped.  She was secured to the operating table. The abdomen was then prepped and draped in the standard sterile fashion. Surgical timeout was called indicating the correct patient, procedure, positioning and need for preoperative antibiotics.   An OG tube was placed by anesthesia and confirmed to be to suction.  At Palmer's point, a stab incision was created and the Veress needle was introduced into the peritoneal cavity on the first attempt.  Intraperitoneal location was confirmed by the aspiration and saline drop test.  Pneumoperitoneum was established to a maximum pressure of 15 mmHg using CO2.  Following this, the abdomen was marked for planned trocar sites.  Just to the right and cephalad to the umbilicus, an 8 mm incision was created and an 8 mm blunt tipped robotic trocar was cautiously placed into the peritoneal cavity.  The laparoscope was inserted and demonstrated no evidence of trocar site nor Veress needle site complications.  The Veress needle was removed.  Bilateral transversus abdominis plane blocks were then created using a dilute mixture of Exparel  with Marcaine .  3 additional 8 mm robotic trochars were placed under direct visualization roughly in a line extending from the right ASIS towards the left upper quadrant. The bladder was inspected and noted to be at/below the pubic symphysis.  Staying 3 fingerbreadths above the pubic symphysis, an incision was created and the 12 mm robotic trocar inserted directed cephalad into the peritoneal cavity under direct visualization.  An additional 5 mm assist port was placed in the right lateral abdomen under direct visualization.  The abdomen was surveyed and there was normal-appearing peritoneal surfaces.  The surface of the liver was normal in appearance.  She was positioned in Trendelenburg with the left side tilted slightly up.  Small bowel  was carefully retracted out of the pelvis.  There is an evident mass at the level of the mid sigmoid  colon with serosal dimpling and a tattoo just distal to it.  This is all consistent with what was seen endoscopically.  The robot was then docked and I went to the console.   The sigmoid colon was readily identified.  We then took down attachments of the sigmoid colon from the intersigmoid fossa.  The rectosigmoid colon was grasped and elevated anteriorly.  Beginning with a medial to lateral approach, the peritoneum overlying the presacral space was carefully incised.  The TME plane was readily gained working in a plane between the fascia propria of the rectum and the presacral fascia.  Hypogastric nerves were seen going along the the presacral fascia and were protected free of injury.  Working more proximally, the mesorectum and sigmoid mesentery were carefully mobilized off of the peritoneum.  The left ureter was identified and protected free of injury.  The left gonadal vessels were identified and protected.  These were both swept down.  The superior hemorrhoidal and IMA pedicles were identified. Further mesocolon was mobilized proximally staying in this plane between the retroperitoneum proper and the mesocolon. Attention was then turned to the lateral portion of dissection.  The sigmoid colon was then retracted to the right.  The sigmoid colon was fully mobilized. The descending colon was mobilized by incising the Kharisma Glasner line of Toldt.  This was done all the way up to the level of the splenic flexure.  She also has a fairly redundant/elongated sigmoid colon.  The associated mesocolon was also mobilized medially.  The left ureter again was confirmed to be well away from the vasculature which had been dissected medially.  The rectosigmoid colon was elevated anteriorly. The left ureter was re-identified. The IMA was clear of this. The IMA was then divided with the vessel sealer. The stump was inspected and noted to be completely hemostatic with a good seal.  The mesentery was divided out to the point of  planned proximal division.  Working more distally, the rectum was identified where the tinea had splayed and there were loss of appendices epiploica.  This also corresponded to a location overlying the sacral promontory.  We opted to go down this location to ensure appropriate blood supply from the rectal circulation.  Anatomically, this clearly represents the proximal rectum.  The mesentery out to this level was then cleared using the vessel sealer. The distal point of transection on the proximal rectum was identified.   A 60 mm green load robotic stapler was then placed through the 12 mm port and introduced into the peritoneal cavity.  The rectum was divided with a signle firing of the stapler.  The stump was intact and healthy in appearance.   We then turned our attention to performing a perfusion test. ICG was administered by anesthesia and at the level of the cleared mesentery proximally, there was excellent uptake of the tracer.  The rectum was also well perfused in appearance.  There was a visible pulse in the mesentery out to the level of the cleared colon at the level of the proximal sigmoid/descending colon junction.  This colon is also supple and healthy in appearance without any thickening.  This reached into the pelvis without any difficulty and remained in that location without any tension. A locking grasper was then placed on the sigmoid staple line.   Attention was turned to the extracorporeal portion of the procedure.  The robot was undocked.  I scrubbed back in.  Using the 12 mm trocar site, a Pfannenstiel incision was created and incorporated the fascial opening through the 12 mm port site.  The rectus fascia was incised and then elevated.  The rectus muscle was mobilized free of the overlying fascia.  The peritoneum was incised in the midline well above the location of the bladder.  An Alexis wound protector was placed.  Towels were placed around the field.  The divided colon was passed  through the wound protector.  The point of proximal division was identified and was again on a healthy segment of supple colon with a palpable pulse in the mesentery. This was pink in color.  A pursestring device was applied.  A 2-0 Prolene on a Keith needle was passed.  The colon was divided and passed off with the open end being proximal.  EEA sizers were then introduced and a 29 mm EEA selected.  Belt loops consisting of 3-0 silk were placed around the pursestring suture line.  The anvil was placed and the pursestring tied.  A small amount of fat was cleared from the planned anastomosis and no diverticula were apparent within this.  This was placed back into the abdomen and a cap placed over the wound protector port site.  Pneumoperitoneum was reestablished.  I then went below to pass the stapler.  My partner remained above.  EEA sizers were cautiously introduced via the anus and advanced under direct visualization.  The stapler was passed and the spike deployed just anterior to the staple line.  The components were then mated.  Orientation was confirmed such that there is no twisting of the colon nor small bowel underneath the mesenteric defect. Care was taken to ensure no other structures were incorporated within this either.  The stapler was then closed, held, and fired. This was then removed. The donuts were inspected and noted to be complete.  The colon proximal to the anastomosis was then gently occluded. The pelvis was filled with sterile irrigation. Under direct visualization, I passed a flexible sigmoidoscope.  The anastomosis was under water.  With good distention of the anastomosis there was no air leak. The anastomosis pink in appearance.  This is located at 15 cm from the anal verge by flexible sigmoidoscopy.  It is hemostatic.  Additionally, looking from above, there is no tension on the colon or mesentery.  Sigmoidoscope was withdrawn.  Irrigation was evacuated from the pelvis.  The abdomen and  pelvis are surveyed and noted to be completely hemostatic without any apparent injury.  Under direct visualization, all trochars are removed.  The Alexis wound protector was removed.  Gowns/gloves are changed and a fresh set of clean instruments utilized. Additional sterile drapes were placed around the field.   The Pfannenstiel peritoneum was closed with a running 2-0 Vicryl suture.  The rectus fascia was then closed using 2 running #1 PDS sutures.  The fascia was then palpated and noted to be completely closed.  Additional anesthetic was infiltrated at the Pfannenstiel site.  Sponge, needle, and instrument counts were reported correct x2. 4-0 Monocryl subcuticular suture was used to close the skin of all incision sites.  Dermabond was placed over all incisions.  A honeycomb dressing placed over the Pfannenstiel as well.   She was then taken out of lithotomy, awakened from anesthesia, extubated, and transferred to a stretcher for transport to PACU in satisfactory condition having tolerated the procedure well.

## 2023-11-01 ENCOUNTER — Encounter (HOSPITAL_COMMUNITY): Payer: Self-pay | Admitting: Surgery

## 2023-11-01 LAB — BASIC METABOLIC PANEL WITH GFR
Anion gap: 10 (ref 5–15)
BUN: 9 mg/dL (ref 8–23)
CO2: 25 mmol/L (ref 22–32)
Calcium: 9 mg/dL (ref 8.9–10.3)
Chloride: 104 mmol/L (ref 98–111)
Creatinine, Ser: 0.75 mg/dL (ref 0.44–1.00)
GFR, Estimated: >60 mL/min (ref >60)
Glucose, Bld: 140 mg/dL — ABNORMAL HIGH (ref 70–99)
Potassium: 4.6 mmol/L (ref 3.5–5.1)
Sodium: 139 mmol/L (ref 135–145)

## 2023-11-01 LAB — CBC
HCT: 42.2 % (ref 36.0–46.0)
Hemoglobin: 13.4 g/dL (ref 12.0–15.0)
MCH: 26.5 pg (ref 26.0–34.0)
MCHC: 31.8 g/dL (ref 30.0–36.0)
MCV: 83.4 fL (ref 80.0–100.0)
Platelets: 180 K/uL (ref 150–400)
RBC: 5.06 MIL/uL (ref 3.87–5.11)
RDW: 19.9 % — ABNORMAL HIGH (ref 11.5–15.5)
WBC: 10.4 K/uL (ref 4.0–10.5)
nRBC: 0 % (ref 0.0–0.2)

## 2023-11-01 MED ORDER — DIPHENHYDRAMINE HCL 12.5 MG/5ML PO ELIX: 12.5000 mg | ORAL_SOLUTION | Freq: Four times a day (QID) | ORAL | Status: DC | PRN

## 2023-11-01 MED ORDER — DIPHENHYDRAMINE HCL 50 MG/ML IJ SOLN: 12.5000 mg | Freq: Four times a day (QID) | INTRAMUSCULAR | Status: DC | PRN

## 2023-11-01 NOTE — Progress Notes (Signed)
  Subjective No acute events. Feeling well. No complaints. No n/v this morning, tolerating liquids. Up walking around, brushing teeth. No real flatus/bm yet.  Objective: Vital signs in last 24 hours: Temp:  [97.9 F (36.6 C)-98.9 F (37.2 C)] 98.9 F (37.2 C) (07/08 0755) Pulse Rate:  [67-88] 79 (07/08 0755) Resp:  [13-21] 17 (07/08 0755) BP: (134-178)/(68-123) 154/73 (07/08 0755) SpO2:  [96 %-99 %] 98 % (07/08 0755) Weight:  [73.5 kg-74.5 kg] 74.5 kg (07/08 0453) Last BM Date : 11/01/23  Intake/Output from previous day: 07/07 0701 - 07/08 0700 In: 1094.9 [P.O.:50; I.V.:944.9; IV Piggyback:100] Out: 1140 [Urine:1140] Intake/Output this shift: No intake/output data recorded.  Gen: NAD, comfortable CV: RRR Pulm: Normal work of breathing Abd: Soft, appropriate tenderness, nondistended Ext: SCDs in place  Lab Results: CBC  Recent Labs    11/01/23 0430  WBC 10.4  HGB 13.4  HCT 42.2  PLT 180   BMET Recent Labs    11/01/23 0430  NA 139  K 4.6  CL 104  CO2 25  GLUCOSE 140*  BUN 9  CREATININE 0.75  CALCIUM 9.0   PT/INR No results for input(s): LABPROT, INR in the last 72 hours. ABG No results for input(s): PHART, HCO3 in the last 72 hours.  Invalid input(s): PCO2, PO2  Studies/Results:  Anti-infectives: Anti-infectives (From admission, onward)    Start     Dose/Rate Route Frequency Ordered Stop   10/31/23 1400  neomycin  (MYCIFRADIN ) tablet 1,000 mg  Status:  Discontinued       Placed in And Linked Group   1,000 mg Oral 3 times per day 10/31/23 1132 10/31/23 1135   10/31/23 1400  metroNIDAZOLE  (FLAGYL ) tablet 1,000 mg  Status:  Discontinued       Placed in And Linked Group   1,000 mg Oral 3 times per day 10/31/23 1132 10/31/23 1135   10/31/23 1145  cefoTEtan  (CEFOTAN ) 2 g in sodium chloride  0.9 % 100 mL IVPB        2 g 200 mL/hr over 30 Minutes Intravenous On call to O.R. 10/31/23 1132 10/31/23 1432        Assessment/Plan: Patient  Active Problem List   Diagnosis Date Noted   S/P laparoscopic-assisted sigmoidectomy 10/31/2023   Diarrhea    Hypokalemia    Colonic mass    Hematemesis with nausea    Acute pancreatitis 03/13/2021   Pancreatitis 03/13/2021   MDD (major depressive disorder), recurrent episode, severe (HCC) 02/10/2021   ADHD 02/10/2021   Major depressive disorder, recurrent (HCC) 02/09/2021   Suicide attempt (HCC)    s/p Procedure(s): COLECTOMY, SIGMOID, ROBOT-ASSISTED SIGMOIDOSCOPY, FLEXIBLE 10/31/2023  - Doing well - Ambulate 5x/day as able - Adv to soft diet; D/C Foley; decrease MIVF to 50 cc/ hr - PPX: SQH, SCDs  We spent time reviewing her procedure, findings, and plans moving forward.  All of her questions were answered, she expressed understanding, and agreement with the plan.   LOS: 1 day   Lonni Pizza, MD [facility]

## 2023-11-01 NOTE — Progress Notes (Signed)
 SDOH for Transportation Needs. Pending DC disposition. TOC will assist with transportation resources.
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
## 2023-11-01 NOTE — Plan of Care (Signed)

## 2023-11-02 LAB — BASIC METABOLIC PANEL WITH GFR
Anion gap: 9 (ref 5–15)
BUN: 9 mg/dL (ref 8–23)
CO2: 24 mmol/L (ref 22–32)
Calcium: 8.5 mg/dL — ABNORMAL LOW (ref 8.9–10.3)
Chloride: 107 mmol/L (ref 98–111)
Creatinine, Ser: 0.82 mg/dL (ref 0.44–1.00)
GFR, Estimated: >60 mL/min (ref >60)
Glucose, Bld: 86 mg/dL (ref 70–99)
Potassium: 3.2 mmol/L — ABNORMAL LOW (ref 3.5–5.1)
Sodium: 140 mmol/L (ref 135–145)

## 2023-11-02 LAB — CBC
HCT: 40.9 % (ref 36.0–46.0)
Hemoglobin: 12.7 g/dL (ref 12.0–15.0)
MCH: 26.5 pg (ref 26.0–34.0)
MCHC: 31.1 g/dL (ref 30.0–36.0)
MCV: 85.2 fL (ref 80.0–100.0)
Platelets: 136 K/uL — ABNORMAL LOW (ref 150–400)
RBC: 4.8 MIL/uL (ref 3.87–5.11)
RDW: 20.2 % — ABNORMAL HIGH (ref 11.5–15.5)
WBC: 7.5 K/uL (ref 4.0–10.5)
nRBC: 0 % (ref 0.0–0.2)

## 2023-11-02 MED ORDER — POTASSIUM CHLORIDE CRYS ER 20 MEQ PO TBCR
60.0000 meq | EXTENDED_RELEASE_TABLET | Freq: Once | ORAL | Status: AC
  Administered 2023-11-02: 60 meq via ORAL
  Filled 2023-11-02: qty 3

## 2023-11-02 MED ORDER — TRAMADOL HCL 50 MG PO TABS: 50.0000 mg | ORAL_TABLET | Freq: Four times a day (QID) | ORAL | 0 refills | Status: AC | PRN

## 2023-11-02 NOTE — Progress Notes (Signed)
 Pharmacy Brief Note - Alvimopan  (Entereg )  The standing order set for alvimopan  (Entereg ) now includes an automatic order to discontinue the drug after the patient has had a bowel movement. The change was approved by the Pharmacy & Therapeutics Committee and the Medical Executive Committee.   This patient has had bowel movements documented by nursing. Therefore, alvimopan  has been discontinued. If there are questions, please contact the pharmacy at [phone].   Thank you- Lacinda Moats, PharmD Clinical Pharmacist  7/9/20259:19 AM

## 2023-11-02 NOTE — Discharge Summary (Signed)
 Patient ID: Debra Jackson MRN: [id number] DOB/AGE: 70-Apr-1957 70 y.o.  Admit date: 10/31/2023 Discharge date: 11/02/2023  Discharge Diagnoses Patient Active Problem List   Diagnosis Date Noted   S/P laparoscopic-assisted sigmoidectomy 10/31/2023   Diarrhea    Hypokalemia    Colonic mass    Hematemesis with nausea    Acute pancreatitis 03/13/2021   Pancreatitis 03/13/2021   MDD (major depressive disorder), recurrent episode, severe (HCC) 02/10/2021   ADHD 02/10/2021   Major depressive disorder, recurrent (HCC) 02/09/2021   Suicide attempt [facility])     Consultants None  Procedures OR 10/31/2023: Robotic assisted anterior resection with double-stapled colorectal anastomosis Intraoperative assessment of perfusion using ICG fluorescence imaging Flexible sigmoidoscopy Bilateral transversus abdominus plane (TAP) blocks  FINDINGS: No evident metastatic disease on visceral parietal peritoneum or liver.  Associated tattoo at approximately the mid sigmoid colon.  Rather large, redundant amounts of sigmoid colon.  A well perfused, tension free, hemostatic, air tight 29 mm EEA colorectal anastomosis fashioned 15 cm from the anal verge by flexible sigmoidoscopy.   Hospital Course: She was admitted postoperatively recovered without issue.  Her diet was gradually advanced.  She tolerated this well.  She began having spontaneous return of bowel function.  On 11/02/2023, she is tolerating solid foods without any nausea or vomiting.  She has no complaints or any pain for that matter.  She denies any distention.  She is passing both flatus and having bowel movements.  She is highly motivated to go home today.  Day of discharge exam: AF VS normal Gen: NAD, comfortable RRR Normal work of breathing Abdomen is soft, nontender, nondistended; all incisions are clean/dry without erythema or drainage.  Ext: no pitting edema  WBC normal Hgb 12.7  Hypokalemia - Potassium 3.2 - given 60 mEq PO replacement  Kcl.  We spent time discussing postoperative expectations.  We discussed lifting restrictions.  We discussed activity plans.  We discussed dietary plans.  All of her questions were answered, she expressed understanding, and agreement with the plan.  Follow-up has been arranged in my office.  She has been encouraged to call any questions or concerns in the interim.   Allergies as of 11/02/2023       Reactions   Penicillins Nausea And Vomiting        Medication List     STOP taking these medications    metroNIDAZOLE  500 MG tablet Commonly known as: FLAGYL    neomycin  500 MG tablet Commonly known as: MYCIFRADIN    ondansetron  4 MG tablet Commonly known as: Zofran        TAKE these medications    cyanocobalamin  1000 MCG tablet Commonly known as: VITAMIN B12 Take 1,000 mcg by mouth daily.   Iron  325 (65 Fe) MG Tabs Take 1 tablet (325 mg total) by mouth daily. take it with 500mg  Vit C or orange juice to increase absorption   MAGNESIUM  PO Take 1 capsule by mouth daily. Glycinate   multivitamin with minerals tablet Take 1 tablet by mouth daily.   OVER THE COUNTER MEDICATION 1 drop by Orogastric route at bedtime. CBD oil extract   POTASSIUM GLUCONATE PO Take 650 mg by mouth daily.   PROBIOTIC PO Take 2 tablets by mouth daily. Olli gummies   QC TUMERIC COMPLEX PO Take 1,600 mg by mouth daily.   traMADol  50 MG tablet Commonly known as: Ultram  Take 1 tablet (50 mg total) by mouth every 6 (six) hours as needed for up to 5 days (postop pain not controlled with  tylenol  and ibuprofen  first).          Follow-up Information     Teresa Lonni HERO, MD Follow up on 11/21/2023.   Specialties: General Surgery, Colon and Rectal Surgery Why: Please arrive by 9:10 am Contact information: [address] [place] [address] [phone]                 Lonni HERO. Teresa, M.D. [facility]

## 2023-11-02 NOTE — Plan of Care (Signed)
  Problem: Bowel/Gastric: Goal: Gastrointestinal status for postoperative course will improve Outcome: Progressing   

## 2023-11-02 NOTE — Care Management Important Message (Signed)
 Important Message  Patient Details IM Letter given Name: Debra Jackson MRN: [id number] Date of Birth: 1955/07/04   Important Message Given:  Yes - Medicare IM     Melba Ates 11/02/2023, 9:13 AM

## 2023-11-03 ENCOUNTER — Telehealth: Payer: Self-pay

## 2023-11-03 NOTE — Transitions of Care (Post Inpatient/ED Visit) (Signed)
   11/03/2023  Name: Debra Jackson MRN: [id number] DOB: [birth year]  Today's TOC FU Call Status: Today's TOC FU Call Status:: Unsuccessful Call (1st Attempt) Unsuccessful Call (1st Attempt) Date: 11/03/23  Attempted to reach the patient regarding the most recent Inpatient/ED visit.  Follow Up Plan: Additional outreach attempts will be made to reach the patient to complete the Transitions of Care (Post Inpatient/ED visit) call.   I also called patient's husband, Garnette: [phone] and voicemail is full  Signature  Slater Diesel, RN

## 2023-11-04 ENCOUNTER — Ambulatory Visit: Payer: Self-pay | Admitting: Surgery

## 2023-11-04 LAB — SURGICAL PATHOLOGY

## 2023-11-07 ENCOUNTER — Telehealth: Payer: Self-pay

## 2023-11-07 NOTE — Transitions of Care (Post Inpatient/ED Visit) (Signed)
   11/07/2023  Name: Debra Jackson MRN: [id number] DOB: [birth year]  Today's TOC FU Call Status: Today's TOC FU Call Status:: Successful TOC FU Call Completed Unsuccessful Call (1st Attempt) Date: 11/03/23 [facility] FU Call Complete Date: 11/07/23 Patient's Name and Date of Birth confirmed.  Transition Care Management Follow-up Telephone Call Date of Discharge: 11/02/23 Discharge Facility: [facility]) Type of Discharge: Inpatient Admission Primary Inpatient Discharge Diagnosis:: s/p lap-assisted sigmoidectomy How have you been since you were released from the hospital?: Better (She said she is doing well, tolerating her food and moving her bowels) Any questions or concerns?: No  Items Reviewed: Did you receive and understand the discharge instructions provided?: Yes Medications obtained,verified, and reconciled?: No Medications Not Reviewed Reasons:: Other: (She said she has all of her medications and did not want to review the med list.) Any new allergies since your discharge?: No Dietary orders reviewed?: Yes Type of Diet Ordered:: heart healthy, low sodium. Do you have support at home?:  (not discussed.  She stated she is doing well)  Medications Reviewed Today: Medications Reviewed Today   Medications were not reviewed in this encounter     Home Care and Equipment/Supplies: Were Home Health Services Ordered?: No Any new equipment or medical supplies ordered?: No  Functional Questionnaire: Do you need assistance with bathing/showering or dressing?: No Do you need assistance with meal preparation?: No Do you need assistance with eating?: No Do you have difficulty maintaining continence: No Do you need assistance with getting out of bed/getting out of a chair/moving?: No Do you have difficulty managing or taking your medications?: No  Follow up appointments reviewed: PCP Follow-up appointment confirmed?: No MD Provider Line Number:[phone] Given: No (She said she did  not want to schedule an appointment with Dr Vicci and she is looking for a new PCP) Specialist Hospital Follow-up appointment confirmed?: Yes Date of Specialist follow-up appointment?: 11/21/23 Follow-Up Specialty Provider:: surgeon Do you need transportation to your follow-up appointment?: No Do you understand care options if your condition(s) worsen?: Yes-patient verbalized understanding    SIGNATURE Slater Diesel, RN

## 2024-01-28 ENCOUNTER — Emergency Department (HOSPITAL_COMMUNITY)

## 2024-01-28 ENCOUNTER — Emergency Department (HOSPITAL_COMMUNITY)
Admission: EM | Admit: 2024-01-28 | Discharge: 2024-01-28 | Disposition: A | Discharge status: Home / Self Care | Attending: Emergency Medicine | Admitting: Emergency Medicine

## 2024-01-28 ENCOUNTER — Other Ambulatory Visit: Payer: Self-pay

## 2024-01-28 ENCOUNTER — Encounter (HOSPITAL_COMMUNITY): Payer: Self-pay

## 2024-01-28 DIAGNOSIS — Z743 Need for continuous supervision: Secondary | ICD-10-CM | POA: Diagnosis not present

## 2024-01-28 DIAGNOSIS — R197 Diarrhea, unspecified: Secondary | ICD-10-CM | POA: Diagnosis not present

## 2024-01-28 DIAGNOSIS — R7401 Elevation of levels of liver transaminase levels: Secondary | ICD-10-CM | POA: Insufficient documentation

## 2024-01-28 DIAGNOSIS — K7689 Other specified diseases of liver: Secondary | ICD-10-CM | POA: Diagnosis not present

## 2024-01-28 DIAGNOSIS — Z9049 Acquired absence of other specified parts of digestive tract: Secondary | ICD-10-CM | POA: Diagnosis not present

## 2024-01-28 DIAGNOSIS — E876 Hypokalemia: Secondary | ICD-10-CM | POA: Diagnosis not present

## 2024-01-28 DIAGNOSIS — R112 Nausea with vomiting, unspecified: Secondary | ICD-10-CM | POA: Insufficient documentation

## 2024-01-28 DIAGNOSIS — N200 Calculus of kidney: Secondary | ICD-10-CM | POA: Diagnosis not present

## 2024-01-28 DIAGNOSIS — D72829 Elevated white blood cell count, unspecified: Secondary | ICD-10-CM | POA: Insufficient documentation

## 2024-01-28 DIAGNOSIS — K449 Diaphragmatic hernia without obstruction or gangrene: Secondary | ICD-10-CM | POA: Insufficient documentation

## 2024-01-28 DIAGNOSIS — R109 Unspecified abdominal pain: Secondary | ICD-10-CM | POA: Diagnosis not present

## 2024-01-28 LAB — COMPREHENSIVE METABOLIC PANEL WITH GFR
ALT: 126 U/L — ABNORMAL HIGH (ref 0–44)
AST: 88 U/L — ABNORMAL HIGH (ref 15–41)
Albumin: 4.1 g/dL (ref 3.5–5.0)
Alkaline Phosphatase: 92 U/L (ref 38–126)
Anion gap: 14 (ref 5–15)
BUN: 13 mg/dL (ref 8–23)
CO2: 20 mmol/L — ABNORMAL LOW (ref 22–32)
Calcium: 8.9 mg/dL (ref 8.9–10.3)
Chloride: 102 mmol/L (ref 98–111)
Creatinine, Ser: 0.84 mg/dL (ref 0.44–1.00)
GFR, Estimated: >60 mL/min (ref >60)
Glucose, Bld: 118 mg/dL — ABNORMAL HIGH (ref 70–99)
Potassium: 2.9 mmol/L — ABNORMAL LOW (ref 3.5–5.1)
Sodium: 136 mmol/L (ref 135–145)
Total Bilirubin: 1.1 mg/dL (ref 0.0–1.2)
Total Protein: 7.2 g/dL (ref 6.5–8.1)

## 2024-01-28 LAB — CBC WITH DIFFERENTIAL/PLATELET
Abs Immature Granulocytes: 0.13 K/uL — ABNORMAL HIGH (ref 0.00–0.07)
Basophils Absolute: 0 K/uL (ref 0.0–0.1)
Basophils Relative: 0 %
Eosinophils Absolute: 0 K/uL (ref 0.0–0.5)
Eosinophils Relative: 0 %
HCT: 48.6 % — ABNORMAL HIGH (ref 36.0–46.0)
Hemoglobin: 16.2 g/dL — ABNORMAL HIGH (ref 12.0–15.0)
Immature Granulocytes: 1 %
Lymphocytes Relative: 5 %
Lymphs Abs: 1.2 K/uL (ref 0.7–4.0)
MCH: 28.5 pg (ref 26.0–34.0)
MCHC: 33.3 g/dL (ref 30.0–36.0)
MCV: 85.6 fL (ref 80.0–100.0)
Monocytes Absolute: 1.3 K/uL — ABNORMAL HIGH (ref 0.1–1.0)
Monocytes Relative: 6 %
Neutro Abs: 19.4 K/uL — ABNORMAL HIGH (ref 1.7–7.7)
Neutrophils Relative %: 88 %
Platelets: 257 K/uL (ref 150–400)
RBC: 5.68 MIL/uL — ABNORMAL HIGH (ref 3.87–5.11)
RDW: 13.2 % (ref 11.5–15.5)
WBC: 22.1 K/uL — ABNORMAL HIGH (ref 4.0–10.5)
nRBC: 0 % (ref 0.0–0.2)

## 2024-01-28 LAB — ETHANOL: Alcohol, Ethyl (B): <15 mg/dL (ref <15)

## 2024-01-28 LAB — LIPASE, BLOOD: Lipase: 24 U/L (ref 11–51)

## 2024-01-28 MED ORDER — ONDANSETRON 8 MG PO TBDP: 8.0000 mg | ORAL_TABLET | Freq: Three times a day (TID) | ORAL | 0 refills | Status: DC | PRN

## 2024-01-28 MED ORDER — ONDANSETRON 8 MG PO TBDP: 8.0000 mg | ORAL_TABLET | Freq: Three times a day (TID) | ORAL | 0 refills | Status: AC | PRN

## 2024-01-28 MED ORDER — LACTATED RINGERS IV BOLUS
1000.0000 mL | Freq: Once | INTRAVENOUS | Status: AC
  Administered 2024-01-28: 1000 mL via INTRAVENOUS

## 2024-01-28 MED ORDER — LORAZEPAM 1 MG PO TABS
0.5000 mg | ORAL_TABLET | Freq: Once | ORAL | Status: AC
  Administered 2024-01-28: 0.5 mg via ORAL
  Filled 2024-01-28: qty 1

## 2024-01-28 MED ORDER — ONDANSETRON HCL 4 MG/2ML IJ SOLN
4.0000 mg | Freq: Once | INTRAMUSCULAR | Status: AC
  Administered 2024-01-28: 4 mg via INTRAVENOUS
  Filled 2024-01-28: qty 2

## 2024-01-28 MED ORDER — PANTOPRAZOLE SODIUM 20 MG PO TBEC: 20.0000 mg | DELAYED_RELEASE_TABLET | Freq: Two times a day (BID) | ORAL | 0 refills | Status: AC

## 2024-01-28 MED ORDER — POTASSIUM CHLORIDE 10 MEQ/100ML IV SOLN
10.0000 meq | Freq: Once | INTRAVENOUS | Status: AC
  Administered 2024-01-28: 10 meq via INTRAVENOUS
  Filled 2024-01-28: qty 100

## 2024-01-28 MED ORDER — IOHEXOL 350 MG/ML SOLN
75.0000 mL | Freq: Once | INTRAVENOUS | Status: AC | PRN
  Administered 2024-01-28: 75 mL via INTRAVENOUS

## 2024-01-28 MED ORDER — PANTOPRAZOLE SODIUM 20 MG PO TBEC: 20.0000 mg | DELAYED_RELEASE_TABLET | Freq: Two times a day (BID) | ORAL | 0 refills | Status: DC

## 2024-01-28 MED ORDER — POTASSIUM CHLORIDE CRYS ER 20 MEQ PO TBCR
40.0000 meq | EXTENDED_RELEASE_TABLET | Freq: Once | ORAL | Status: DC
  Filled 2024-01-28: qty 2

## 2024-01-28 NOTE — ED Notes (Signed)
 Pt c/o increased nausea and anxiety thru the roof EDP [facility] notified via secure chat. Pt asking for anxiety medication

## 2024-01-28 NOTE — ED Notes (Signed)
 Patient transported to CT

## 2024-01-28 NOTE — ED Provider Notes (Signed)
 Patient was initially seen by Dr. Levander.  Please see her note  Clinical Course as of 01/28/24 2131  Sat Jan 28, 2024  1723 Labs show leukocytosis hypokalemia noted.  LFTs mildly elevated.  Patient is requesting something for her nerves [JK]  2122 Lipase, blood Lipase normal [JK]  2122 CT scan does not show any acute abnormalities.  No diverticulitis.  Patient does have some thickening of the distal esophagus that could be associated with esophagitis. [JK]    Clinical Course User Index [JK] Randol Simmonds, MD   Findings were discussed with the patient.  Patient presented with nausea vomiting diarrhea.  ED workup reassuring.  CT scan does not show any acute abnormality other than some possible esophagitis.  No evidence of pancreatitis.  Labs are otherwise unremarkable.  Patient feels comfortable with discharge.   Randol Simmonds, MD 01/28/24 2131

## 2024-01-28 NOTE — ED Triage Notes (Signed)
 Pt BIB [facility] from home with c/o N/V/D x 36 hrs. As per patient her stool was dark. Denies blood thinners.

## 2024-01-28 NOTE — ED Provider Notes (Signed)
 [place] EMERGENCY DEPARTMENT AT [facility] Provider Note   CSN: [id number] Arrival date & time: 01/28/24  1335     Patient presents with: Nausea, Emesis, and Diarrhea   Debra Jackson is a 68 y.o. female.   HPI 68 year old female history of acute pancreatitis, alcohol abuse, polysubstance abuse, major depressive disorder, presents today complaining of nausea vomiting and diarrhea.  She states that it began yesterday and she has had continuous vomiting through the night.  She also reports large amounts of loose stool.    Prior to Admission medications   Medication Sig Start Date End Date Taking? Authorizing Provider  cyanocobalamin  (VITAMIN B12) 1000 MCG tablet Take 1,000 mcg by mouth daily.    [provider]  Ferrous Sulfate  (IRON ) 325 (65 Fe) MG TABS Take 1 tablet (325 mg total) by mouth daily. take it with 500mg  Vit C or orange juice to increase absorption 08/30/23   Craig Alan SAUNDERS, PA-C  MAGNESIUM  PO Take 1 capsule by mouth daily. Glycinate    [provider]  Multiple Vitamins-Minerals (MULTIVITAMIN WITH MINERALS) tablet Take 1 tablet by mouth daily.    [provider]  OVER THE COUNTER MEDICATION 1 drop by Orogastric route at bedtime. CBD oil extract    [provider]  POTASSIUM GLUCONATE PO Take 650 mg by mouth daily.    [provider]  Probiotic Product (PROBIOTIC PO) Take 2 tablets by mouth daily. Olli gummies    [provider]  Turmeric (QC TUMERIC COMPLEX PO) Take 1,600 mg by mouth daily.    [provider]    Allergies: Penicillins    Review of Systems  Updated Vital Signs BP 132/72 (BP Location: Right Arm)   Pulse 93   Temp 98.4 F (36.9 C) (Oral)   Resp 18   SpO2 100%   Physical Exam Vitals reviewed.  HENT:     Head: Normocephalic.     Right Ear: External ear normal.     Left Ear: External ear normal.     Nose: Nose normal.     Mouth/Throat:     Pharynx: Oropharynx is  clear.  Eyes:     Pupils: Pupils are equal, round, and reactive to light.  Cardiovascular:     Rate and Rhythm: Normal rate and regular rhythm.     Pulses: Normal pulses.  Pulmonary:     Effort: Pulmonary effort is normal.     Breath sounds: Normal breath sounds.  Abdominal:     General: Abdomen is flat. Bowel sounds are normal. There is no distension.     Palpations: Abdomen is soft.     Tenderness: There is no abdominal tenderness.  Musculoskeletal:        General: Normal range of motion.     Cervical back: Normal range of motion and neck supple.  Skin:    General: Skin is warm and dry.     Capillary Refill: Capillary refill takes less than 2 seconds.  Neurological:     General: No focal deficit present.     Mental Status: She is alert.  Psychiatric:        Mood and Affect: Mood normal.     (all labs ordered are listed, but only abnormal results are displayed) Labs Reviewed  CBC WITH DIFFERENTIAL/PLATELET - Abnormal; Notable for the following components:      Result Value   WBC 22.1 (*)    RBC 5.68 (*)    Hemoglobin 16.2 (*)  HCT 48.6 (*)    Neutro Abs 19.4 (*)    Monocytes Absolute 1.3 (*)    Abs Immature Granulocytes 0.13 (*)    All other components within normal limits  ETHANOL  RAPID URINE DRUG SCREEN, HOSP PERFORMED  COMPREHENSIVE METABOLIC PANEL WITH GFR  LIPASE, BLOOD    EKG: None  Radiology: No results found.   Procedures   Medications Ordered in the ED  ondansetron  (ZOFRAN ) injection 4 mg (has no administration in time range)  lactated ringers  bolus 1,000 mL (1,000 mLs Intravenous New Bag/Given 01/28/24 1503)  ondansetron  (ZOFRAN ) injection 4 mg (4 mg Intravenous Given 01/28/24 1503)                                    Medical Decision Making Amount and/or Complexity of Data Reviewed Labs: ordered.  Risk Prescription drug management.   68 year old female presents today complaining of nausea vomiting and diarrhea.  Patient has a history  of pancreatitis but has no tenderness on exam today reports no alcohol use for the past year. Patient evaluated here with labs.  CBC is reviewed interpreted seen for white count of 22,000. Patient had nausea and vomiting here in the ED.  Treated with Zofran  here.  Awaiting complete metabolic panel, lipase, EtOH, and urine drug screen Differential diagnosis includes but is not limited to gastroenteritis, GI bleed, pancreatitis Care discussed with Dr. Carvel who assumes care at this time    Final diagnoses:  Nausea vomiting and diarrhea    ED Discharge Orders     None          Levander Houston, MD 01/28/24 1645

## 2024-01-28 NOTE — ED Notes (Signed)
 Light green sent to lab as last one hemolyzed

## 2024-01-28 NOTE — Discharge Instructions (Signed)
 Take the medications to help with the nausea and acid discomfort.  Follow-up with your primary care doctor to be rechecked.  Consider seeing a GI doctor for further evaluation if the symptoms persist
